# Patient Record
Sex: Male | Born: 1940 | Race: White | Hispanic: No | Marital: Married | Smoking: Former smoker
Health system: Southern US, Community
[De-identification: ages and names within clinical notes are randomized; demographics above are authoritative.]

## PROBLEM LIST (undated history)

## (undated) DIAGNOSIS — R51 Headache: Secondary | ICD-10-CM

## (undated) DIAGNOSIS — I351 Nonrheumatic aortic (valve) insufficiency: Secondary | ICD-10-CM

## (undated) DIAGNOSIS — R519 Headache, unspecified: Secondary | ICD-10-CM

## (undated) DIAGNOSIS — G4733 Obstructive sleep apnea (adult) (pediatric): Secondary | ICD-10-CM

## (undated) DIAGNOSIS — I712 Thoracic aortic aneurysm, without rupture, unspecified: Secondary | ICD-10-CM

## (undated) HISTORY — DX: Obstructive sleep apnea (adult) (pediatric): G47.33

## (undated) HISTORY — DX: Headache: R51

## (undated) HISTORY — DX: Headache, unspecified: R51.9

## (undated) HISTORY — PX: CATARACT EXTRACTION, BILATERAL: SHX1313

## (undated) HISTORY — PX: NOSE SURGERY: SHX723

## (undated) HISTORY — DX: Thoracic aortic aneurysm, without rupture, unspecified: I71.20

## (undated) HISTORY — DX: Nonrheumatic aortic (valve) insufficiency: I35.1

## (undated) HISTORY — PX: CHOLECYSTECTOMY: SHX55

## (undated) HISTORY — DX: Thoracic aortic aneurysm, without rupture: I71.2

## (undated) HISTORY — PX: HERNIA REPAIR: SHX51

## (undated) HISTORY — PX: BACK SURGERY: SHX140

---

## 2007-01-01 ENCOUNTER — Observation Stay (HOSPITAL_COMMUNITY): Admission: RE | Admit: 2007-01-01 | Discharge: 2007-01-02 | Payer: Self-pay | Admitting: Neurological Surgery

## 2008-12-23 LAB — HM COLONOSCOPY

## 2013-10-14 ENCOUNTER — Other Ambulatory Visit: Payer: Self-pay | Admitting: Family Medicine

## 2013-10-14 ENCOUNTER — Ambulatory Visit
Admission: RE | Admit: 2013-10-14 | Discharge: 2013-10-14 | Disposition: A | Discharge status: Home / Self Care | Payer: PRIVATE HEALTH INSURANCE | Source: Ambulatory Visit | Attending: Family Medicine | Admitting: Family Medicine

## 2013-10-14 DIAGNOSIS — M7071 Other bursitis of hip, right hip: Secondary | ICD-10-CM

## 2013-11-04 ENCOUNTER — Ambulatory Visit (INDEPENDENT_AMBULATORY_CARE_PROVIDER_SITE_OTHER): Payer: PRIVATE HEALTH INSURANCE | Admitting: Cardiovascular Disease

## 2013-11-04 ENCOUNTER — Encounter: Payer: Self-pay | Admitting: Cardiovascular Disease

## 2013-11-04 VITALS — BP 136/82 | HR 67 | Ht 71.0 in | Wt 184.0 lb

## 2013-11-04 DIAGNOSIS — I712 Thoracic aortic aneurysm, without rupture: Secondary | ICD-10-CM | POA: Insufficient documentation

## 2013-11-04 DIAGNOSIS — Z79899 Other long term (current) drug therapy: Secondary | ICD-10-CM

## 2013-11-04 DIAGNOSIS — E782 Mixed hyperlipidemia: Secondary | ICD-10-CM

## 2013-11-04 DIAGNOSIS — G4733 Obstructive sleep apnea (adult) (pediatric): Secondary | ICD-10-CM

## 2013-11-04 DIAGNOSIS — I359 Nonrheumatic aortic valve disorder, unspecified: Secondary | ICD-10-CM

## 2013-11-04 DIAGNOSIS — I351 Nonrheumatic aortic (valve) insufficiency: Secondary | ICD-10-CM

## 2013-11-04 NOTE — Assessment & Plan Note (Signed)
Followed annually by CT angiography to that was performed by Dr. Luberta Robertson . We will reschedule this

## 2013-11-04 NOTE — Assessment & Plan Note (Signed)
Followed by duplex ultrasound. The patient is asymptomatic

## 2013-11-04 NOTE — Patient Instructions (Signed)
  Your physician wants you to follow-up with him in : 1 year with Dr San Morelle will receive a reminder letter in the mail one month in advance. If you don't receive a letter, please call our office to schedule the follow-up appointment.   Your physician recommends that you return for lab work in: 1-2 weeks, fasting    Your physician has ordered the following tests: echocardiogram and CT angio of the chest to look at thoracic aortic aneurysm

## 2013-11-04 NOTE — Progress Notes (Signed)
11/04/2013 Christopher Atkinson   [birth year]  [id number]  Primary Physician Aida Puffer, MD Primary Cardiologist: Runell Gess MD Roseanne Reno   HPI:  Mr. 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 His cardiac risk factor profile is essentially benign. He was followed for thoracic aortic aneurysm by CT angiography annually, aortic insufficiency by 2-D echocardiography. He also has obstructive sleep apnea on CPAP. He denies chest pain or shortness of breath.   Current Outpatient Prescriptions  Medication Sig Dispense Refill  . aspirin 81 MG tablet Take 81 mg by mouth daily.      . calcium-vitamin D (OSCAL WITH D) 500-200 MG-UNIT per tablet Take 1 tablet by mouth daily with breakfast.      . Coenzyme Q10 (CO Q-10) 100 MG CAPS Take 100 mg by mouth daily.      . Multiple Vitamins-Minerals ([employer]S SPECTRAVITE ADULT 50+ PO) Take 1 tablet by mouth daily.      . Nutritional Supplements (JUICE PLUS FIBRE PO) Take 2 capsules by mouth daily. Garden blend and orchard blend      . Nutritional Supplements (PROSTAMEN PO) Take 1 capsule by mouth daily.      . Omega-3 Fatty Acids (OMEGA-3 PLUS PO) Take 1 tablet by mouth daily.      . Potassium 99 MG TABS Take 99 mg by mouth daily.      . Saw Palmetto 450 MG CAPS Take 450 mg by mouth daily.       No current facility-administered medications for this visit.    No Known Allergies  History   Social History  . Marital Status: Married    Spouse Name: N/A    Number of Children: N/A  . Years of Education: N/A   Occupational History  . Not on file.   Social History Main Topics  . Smoking status: Former Games developer  . Smokeless tobacco: Not on file     Comment: quit smoking when he was 72 years old  . Alcohol Use: No  . Drug Use: Not on file  . Sexual Activity: Not on file   Other Topics Concern    . Not on file   Social History Narrative  . No narrative on file     Review of Systems: General: negative for chills, fever, night sweats or weight changes.  Cardiovascular: negative for chest pain, dyspnea on exertion, edema, orthopnea, palpitations, paroxysmal nocturnal dyspnea or shortness of breath Dermatological: negative for rash Respiratory: negative for cough or wheezing Urologic: negative for hematuria Abdominal: negative for nausea, vomiting, diarrhea, bright red blood per rectum, melena, or hematemesis Neurologic: negative for visual changes, syncope, or dizziness All other systems reviewed and are otherwise negative except as noted above.    Blood pressure 136/82, pulse 67, height 5\' 11"  (1.803 m), weight 184 lb (83.462 kg).  General appearance: alert and no distress Neck: no adenopathy, no carotid bruit, no JVD, supple, symmetrical, trachea midline and thyroid not enlarged, symmetric, no tenderness/mass/nodules Lungs: clear to auscultation bilaterally Heart: soft diastolic murmur at left sternal border Abdomen: soft, non-tender; bowel sounds normal; no masses,  no organomegaly Extremities: extremities normal, atraumatic, no cyanosis or edema Pulses: 2+ and symmetric  EKG normal sinus rhythm at 67 with small inferior Q waves and otherwise nonspecific ST and T-wave changes unchanged from prior EKGs  ASSESSMENT AND PLAN:   Obstructive sleep  apnea Currently on CPAP  Thoracic aortic aneurysm Followed annually by CT angiography to that was performed by Dr. Luberta Robertson . We will reschedule this  Aortic insufficiency Followed by duplex ultrasound. The patient is asymptomatic      Runell Gess MD [facility], [facility] 11/04/2013 9:31 AM

## 2013-11-04 NOTE — Assessment & Plan Note (Signed)
Currently on CPAP. 

## 2013-11-05 ENCOUNTER — Telehealth: Payer: Self-pay | Admitting: Cardiovascular Disease

## 2013-11-05 NOTE — Telephone Encounter (Signed)
Appointment was scheduled in the late afternoon on Thursday 11/13 for Monday 11/17. A prior authorization request was submitted to [facility] Friday 11/14 in the morning. We have not received authorization from [place] at this time-it could take up to 3 business days. Called [facility] to notify. They rescheduled for Thursday 11/20 at 9:20am and asked me to notify the patient of the change. I called the patient at [phone] at 2 pm 11/14 and left a message that their appointment for Monday has been cancelled and moved to Thursday 11/20 at 9:20 am at [facility], and to call the office if she has any questions.

## 2013-11-08 ENCOUNTER — Other Ambulatory Visit: Payer: Self-pay | Admitting: Cardiovascular Disease

## 2013-11-08 ENCOUNTER — Encounter: Payer: Self-pay | Admitting: *Deleted

## 2013-11-08 ENCOUNTER — Other Ambulatory Visit: Payer: PRIVATE HEALTH INSURANCE

## 2013-11-08 LAB — HEPATIC FUNCTION PANEL
ALT: 25 U/L (ref 0–53)
Albumin: 4.2 g/dL (ref 3.5–5.2)
Alkaline Phosphatase: 64 U/L (ref 39–117)
Bilirubin, Direct: 0.2 mg/dL (ref 0.0–0.3)
Total Bilirubin: 1.1 mg/dL (ref 0.3–1.2)
Total Protein: 6.9 g/dL (ref 6.0–8.3)

## 2013-11-08 LAB — BASIC METABOLIC PANEL
BUN: 16 mg/dL (ref 6–23)
CO2: 30 mEq/L (ref 19–32)
Calcium: 9.8 mg/dL (ref 8.4–10.5)
Sodium: 140 mEq/L (ref 135–145)

## 2013-11-08 LAB — LIPID PANEL
LDL Cholesterol: 100 mg/dL — ABNORMAL HIGH (ref 0–99)
Total CHOL/HDL Ratio: 4 Ratio
Triglycerides: 87 mg/dL (ref <150)

## 2013-11-11 ENCOUNTER — Ambulatory Visit
Admission: RE | Admit: 2013-11-11 | Discharge: 2013-11-11 | Disposition: A | Discharge status: Home / Self Care | Payer: PRIVATE HEALTH INSURANCE | Source: Ambulatory Visit | Attending: Cardiovascular Disease | Admitting: Cardiovascular Disease

## 2013-11-11 ENCOUNTER — Encounter: Payer: Self-pay | Admitting: Cardiovascular Disease

## 2013-11-11 DIAGNOSIS — I712 Thoracic aortic aneurysm, without rupture: Secondary | ICD-10-CM

## 2013-11-11 MED ORDER — IOHEXOL 350 MG/ML SOLN
80.0000 mL | Freq: Once | INTRAVENOUS | Status: AC | PRN
  Administered 2013-11-11: 80 mL via INTRAVENOUS

## 2013-11-24 ENCOUNTER — Telehealth: Payer: Self-pay | Admitting: Cardiovascular Disease

## 2013-11-24 ENCOUNTER — Ambulatory Visit (HOSPITAL_COMMUNITY)
Admission: RE | Admit: 2013-11-24 | Discharge: 2013-11-24 | Disposition: A | Discharge status: Home / Self Care | Payer: PRIVATE HEALTH INSURANCE | Source: Ambulatory Visit | Attending: Cardiovascular Disease | Admitting: Cardiovascular Disease

## 2013-11-24 DIAGNOSIS — I712 Thoracic aortic aneurysm, without rupture: Secondary | ICD-10-CM

## 2013-11-24 DIAGNOSIS — I719 Aortic aneurysm of unspecified site, without rupture: Secondary | ICD-10-CM | POA: Insufficient documentation

## 2013-11-24 DIAGNOSIS — I351 Nonrheumatic aortic (valve) insufficiency: Secondary | ICD-10-CM

## 2013-11-24 DIAGNOSIS — E785 Hyperlipidemia, unspecified: Secondary | ICD-10-CM | POA: Insufficient documentation

## 2013-11-24 DIAGNOSIS — I359 Nonrheumatic aortic valve disorder, unspecified: Secondary | ICD-10-CM | POA: Insufficient documentation

## 2013-11-24 DIAGNOSIS — G4730 Sleep apnea, unspecified: Secondary | ICD-10-CM | POA: Insufficient documentation

## 2013-11-24 NOTE — Progress Notes (Signed)
2D Echo Performed 11/24/2013    Mikka Kissner, RCS  

## 2013-11-24 NOTE — Telephone Encounter (Signed)
Returning your call from yesterday-concerning test results.

## 2013-11-25 ENCOUNTER — Telehealth: Payer: Self-pay | Admitting: Cardiovascular Disease

## 2013-11-25 NOTE — Telephone Encounter (Signed)
I called [facility].  They have the referral for Christopher Atkinson and will be calling the patient to set up an appointment.

## 2013-11-25 NOTE — Telephone Encounter (Signed)
Message copied by Marella Bile on Thu Nov 25, 2013 10:48 AM ------      Message from: Runell Gess      Created: Fri Nov 12, 2013  6:31 AM       Stable ascending TAA with Max diameter of 5.0 cm. Will continue to follow. ------

## 2013-12-08 ENCOUNTER — Encounter: Payer: Self-pay | Admitting: Surgery

## 2013-12-08 ENCOUNTER — Institutional Professional Consult (permissible substitution) (INDEPENDENT_AMBULATORY_CARE_PROVIDER_SITE_OTHER): Payer: PRIVATE HEALTH INSURANCE | Admitting: Surgery

## 2013-12-08 VITALS — BP 130/79 | HR 60 | Resp 18 | Ht 71.0 in | Wt 184.0 lb

## 2013-12-08 DIAGNOSIS — I712 Thoracic aortic aneurysm, without rupture, unspecified: Secondary | ICD-10-CM

## 2013-12-10 ENCOUNTER — Encounter: Payer: Self-pay | Admitting: Surgery

## 2013-12-10 NOTE — Progress Notes (Signed)
PCP is Aida Puffer, MD Referring Provider is Runell Gess, MD  Chief Complaint  Patient presents with  . Thoracic Aortic Aneurysm    Surgical eval on ascending aortic aneurysm, CTA Chest 11/11/13, 2D Echo 11/24/13    HPI:  The patient is a 72 year old gentleman who has been followed for many years by Dr. Retta Mac in [facility] until he recently retired. He has a know history of an ascending aortic aneurysm and mild AI although his family denies ever being told by Dr. Luberta Robertson that he had an aneurysm. His cardiology care was recently transferred to Dr. Allyson Sabal. A recent 2D echo showed mild AI with dilatation of the aortic root and ascending aorta with a maximum diameter at the sinus of valsalva of 43 mm and at the mid-ascending level of 43 mm. He had a CTA of the chest on 12 /03/2011 which showed the maximum diameter of the ascending aorta to be 5.0 cm which is unchanged compared to prior studies dating back to 10/01/2010. He had a negative stress echo in 05/2005 and negative Lexiscans in 12/2009 and 12/2011. He reports that he feels fine with no chest or back pain. His family feels that his energy level is down and they are concerned that he has some dementia that has not been worked up.   Past Medical History  Diagnosis Date  . Thoracic aortic aneurysm   . Aortic insufficiency   . Obstructive sleep apnea     on CPAP    History reviewed. No pertinent past surgical history.  Family History  Problem Relation Age of Onset  . Heart Problems Father     CHF  . Stroke Brother     Social History History  Substance Use Topics  . Smoking status: Former Games developer  . Smokeless tobacco: Not on file     Comment: quit smoking when he was 72 years old  . Alcohol Use: No    Current Outpatient Prescriptions  Medication Sig Dispense Refill  . aspirin 81 MG tablet Take 81 mg by mouth daily.      . calcium-vitamin D (OSCAL WITH D) 500-200 MG-UNIT per tablet Take 1 tablet by mouth  daily with breakfast.      . Coenzyme Q10 (CO Q-10) 100 MG CAPS Take 100 mg by mouth daily.      . Multiple Vitamins-Minerals (CVS SPECTRAVITE ADULT 50+ PO) Take 1 tablet by mouth daily.      . Nutritional Supplements (JUICE PLUS FIBRE PO) Take 2 capsules by mouth daily. Garden blend and orchard blend      . Nutritional Supplements (PROSTAMEN PO) Take 1 capsule by mouth daily.      . Omega-3 Fatty Acids (OMEGA-3 PLUS PO) Take 1 tablet by mouth daily.      . Potassium 99 MG TABS Take 99 mg by mouth daily.      . Saw Palmetto 450 MG CAPS Take 450 mg by mouth daily.       No current facility-administered medications for this visit.    No Known Allergies  Review of Systems  Constitutional: Positive for activity change. Negative for fever, chills, diaphoresis, appetite change, fatigue and unexpected weight change.  HENT: Negative.   Eyes: Negative.   Respiratory: Negative.   Cardiovascular: Negative.   Gastrointestinal: Negative.   Endocrine: Negative.   Genitourinary: Negative.   Musculoskeletal: Negative.   Skin: Negative.   Allergic/Immunologic: Negative.   Neurological: Negative.  Family has noticed some short-term memory loss  Hematological: Negative.   Psychiatric/Behavioral: Negative.     BP 130/79  Pulse 60  Resp 18  Ht 5\' 11"  (1.803 m)  Wt 184 lb (83.462 kg)  BMI 25.67 kg/m2  SpO2 98% Physical Exam  Constitutional: He is oriented to person, place, and time. He appears well-developed and well-nourished. No distress.  HENT:  Head: Normocephalic and atraumatic.  Mouth/Throat: Oropharynx is clear and moist.  Eyes: EOM are normal. Pupils are equal, round, and reactive to light.  Neck: Normal range of motion. Neck supple. No JVD present. No thyromegaly present.  Cardiovascular: Normal rate, regular rhythm and intact distal pulses.   Murmur heard. 1/6 diastolic murmur along RSB  Pulmonary/Chest: Effort normal and breath sounds normal. No respiratory distress. He  has no wheezes. He has no rales. He exhibits no tenderness.  Abdominal: Soft. Bowel sounds are normal. He exhibits no distension and no mass. There is no tenderness.  Musculoskeletal: Normal range of motion. He exhibits no edema.  Lymphadenopathy:    He has no cervical adenopathy.  Neurological: He is alert and oriented to person, place, and time. No cranial nerve deficit.  Skin: Skin is warm and dry. No erythema.  Psychiatric: He has a normal mood and affect.     Diagnostic Tests:  *Cardiovascular Imaging at [facility] [address], [address] [place], [place] [address] [phone]  ------------------------------------------------------------ Echocardiography  Patient: Luisalberto, Beegle MR #: [id number] Study Date: 11/24/2013 Gender: M Age: 71 Height: 180.3cm Weight: 83.5kg BSA: 2.57m^2 Pt. Status: Room:  ATTENDING Ginnie Smart, MD REFERRING Nanetta Batty, MD REFERRING Aida Puffer SONOGRAPHER Clearence Ped, RCS PERFORMING [facility] cc:  ------------------------------------------------------------ LV EF: 55% - 60%  ------------------------------------------------------------ Indications: 424.1 Aortic valve disorders.  ------------------------------------------------------------ History: PMH: Hyperlipidemia, sleep apnea, aortic aneurysm  ------------------------------------------------------------ Study Conclusions  - Left ventricle: The cavity size was normal. Systolic function was normal. The estimated ejection fraction was in the range of 55% to 60%. Wall motion was normal; there were no regional wall motion abnormalities. Left ventricular diastolic function parameters were normal. - Aortic valve: Mild regurgitation. - Ascending aorta: The ascending aorta was mildly dilated. Maximum diameter is 43 mm at the sinuses of Valsalva and 43 mm in the mid ascending aorta. The sinotubular junction is not effaced. - Mitral valve: Valve  area by pressure half-time: 2.06cm^2. - Atrial septum: No defect or patent foramen ovale was identified. Echocardiography. M-mode, complete 2D, spectral Doppler, and color Doppler. Height: Height: 180.3cm. Height: 71in. Weight: Weight: 83.5kg. Weight: 183.6lb. Body mass index: BMI: 25.7kg/m^2. Body surface area: BSA: 2.56m^2. Blood pressure: 136/82. Patient status: Outpatient. Location: Echo laboratory.  ------------------------------------------------------------  ------------------------------------------------------------ Left ventricle: The cavity size was normal. Systolic function was normal. The estimated ejection fraction was in the range of 55% to 60%. Wall motion was normal; there were no regional wall motion abnormalities. The transmitral flow pattern was normal. The deceleration time of the early transmitral flow velocity was normal. The pulmonary vein flow pattern was normal. The tissue Doppler parameters were normal. Left ventricular diastolic function parameters were normal.  ------------------------------------------------------------ Aortic valve: Doppler: Mild regurgitation. VTI ratio of LVOT to aortic valve: 0.73. Peak velocity ratio of LVOT to aortic valve: 0.63. Mean gradient: 6mm Hg (S). Peak gradient: 12mm Hg (S).  ------------------------------------------------------------ Aorta: Ascending aorta: The ascending aorta was mildly dilated. Maximum diameter is 43 mm at the sinuses of Valsalva and 43 mm in the mid ascending aorta. The sinotubular junction is not effaced.  ------------------------------------------------------------  Mitral valve: Doppler: Trivial regurgitation. Valve area by pressure half-time: 2.06cm^2. Indexed valve area by pressure half-time: 1.01cm^2/m^2. Mean gradient: 1mm Hg (D).  ------------------------------------------------------------ Left atrium: LA volume / BSA = 22.5 ml/m2 The atrium was normal in  size.  ------------------------------------------------------------ Atrial septum: No defect or patent foramen ovale was identified.  ------------------------------------------------------------ Right ventricle: The cavity size was normal. Wall thickness was normal. Systolic function was normal.  ------------------------------------------------------------ Pulmonic valve: The valve appears to be grossly normal. Doppler: Mild regurgitation.  ------------------------------------------------------------ Tricuspid valve: Structurally normal valve. Leaflet separation was normal. Doppler: Transvalvular velocity was within the normal range. No regurgitation.  ------------------------------------------------------------ Pulmonary artery: Systolic pressure was within the normal range.  ------------------------------------------------------------ Right atrium: The atrium was normal in size.  ------------------------------------------------------------ Pericardium: There was no pericardial effusion.  ------------------------------------------------------------ Systemic veins: Inferior vena cava: The vessel was normal in size; the respirophasic diameter changes were in the normal range (= 50%); findings are consistent with normal central venous pressure.  ------------------------------------------------------------  2D measurements Normal Doppler measurements Normal Left ventricle Main pulmonary LVID ED, 53.1 mm 43-52 artery chord, Pressure, 18 mm Hg =30 PLAX S LVID ES, 35.7 mm 23-38 Left ventricle chord, Ea, lat 7 cm/s ------ PLAX ann, tiss FS, 33 % >29 DP chord, E/Ea, lat 9.66 ------ PLAX ann, tiss LVPW, ED 9.04 mm ------ DP IVS/LVPW 1.02 <1.3 Ea, med 5.5 cm/s ------ ratio, ED ann, tiss Ventricular septum DP IVS, ED 9.18 mm ------ E/Ea, med 12.2 ------ Aortic valve ann, tiss 9 Annulus 23.28 mm 14-26 DP diam, ED LVOT Aorta Peak vel, 108 cm/s ------ Root max 42.44 mm  ------ S diam, ED VTI, S 26.9 cm ------ S-T junct 38.01 mm ------ Aortic valve diam, ED Peak vel, 172 cm/s ------ Mid-AAo 42.92 mm 21-34 S AP diam, Mean vel, 109 cm/s ------ ED S Left atrium VTI, S 37.1 cm ------ AP dim 37 mm ------ Mean 6 mm Hg ------ AP dim 1.81 cm/m^2 <2.2 gradient, index S Right ventricle Peak 12 mm Hg ------ RVID ED, 27.6 mm 19-38 gradient, PLAX S VTI ratio 0.73 ------ LVOT/AV Peak vel 0.63 ------ ratio, LVOT/AV Regurg PHT 599 ms ------ Mitral valve Peak E vel 67.6 cm/s ------ Peak A vel 63.2 cm/s ------ Mean vel, 43.9 cm/s ------ D Decelerati 257 ms 150-23 on time 0 Pressure 107 ms ------ half-time Mean 1 mm Hg ------ gradient, D Peak E/A 1.1 ------ ratio Area (PHT) 2.06 cm^2 ------ Area index 1.01 cm^2/m ------ (PHT) ^2 Annulus 28.1 cm ------ VTI Tricuspid valve Regurg 181 cm/s ------ peak vel Peak RV-RA 13 mm Hg ------ gradient, S Systemic veins Estimated 5 mm Hg ------ CVP Right ventricle Pressure, 18 mm Hg <30 S Sa vel, 12.9 cm/s ------ lat ann, tiss DP  ------------------------------------------------------------ Prepared and Electronically Authenticated by  Croitoru, Mihai 2014-12-03T13:21:29.017   CLINICAL DATA: Thoracic aortic aneurysm  EXAM:  CT ANGIOGRAPHY CHEST WITH CONTRAST  TECHNIQUE:  Multidetector CT imaging of the chest was performed using the  standard protocol during bolus administration of intravenous  contrast. Multiplanar CT image reconstructions including MIPs were  obtained to evaluate the vascular anatomy.  CONTRAST: 80mL OMNIPAQUE IOHEXOL 350 MG/ML SOLN  COMPARISON: 02/13/2012  FINDINGS:  Maximal diameters of the ascending aorta at the sinus, sino-tubular  junction, and ascending aorta are 4.6 cm, 3.8 cm, and 5.0 cm.  Previously, maximal diameter was recorded at 5.2 cm. This is likely  not significantly changed allowing for differences in measuring  technique. Great vessels are widely patent.  Vertebral arteries  within the extend of the study are also patent.  No evidence of aortic dissection or intramural hematoma.  No abnormal mediastinal adenopathy. No pericardial effusion.  Lungs are clear.  No pleural effusion. No pneumothorax.  No acute bony deformity.  Images of the upper abdomen demonstrate a benign right renal cysts  and postcholecystectomy clips.  Review of the MIP images confirms the above findings.  IMPRESSION:  Stable ascending aortic aneurysm with a maximal diameter of 5.0 cm.  Electronically Signed  By: Maryclare Bean M.D.  On: 11/11/2013 10:07    Impression:  He has a stable 5 cm ascending aortic aneurysm with mild AI. This has been stable on CT scan since 10/01/10. This is still below the usual threshold of 5.5 cm where we recommend surgery in patients with tricuspid aortic valves. Since he is 72 years old and this has been stable for at least the last 3 years I would recommend a conservative approach and continue to follow this yearly. I have reviewed the echo and CT scan pictures with the patient and his family and answered all of their questions. I discussed the importance of good blood pressure control. He should probably be on a beta-blocker if tolerated but I will leave that decision up to Dr. Allyson Sabal. I discussed the small but potential risk of an aortic dissection or rupture with a 5 cm aorta and the fact that his operative risk now is higher than the risk of these complications. I discussed the signs and symptoms of aortic dissection and impending rupture with them. His family seems most concerned about the possibility of dementia and I told them that that should be evaluated by a neurologist.  Plan:  I will see him back in 1 year with a CTA of the chest.

## 2013-12-22 ENCOUNTER — Telehealth: Payer: Self-pay | Admitting: *Deleted

## 2013-12-22 ENCOUNTER — Encounter: Payer: Self-pay | Admitting: *Deleted

## 2013-12-22 DIAGNOSIS — I351 Nonrheumatic aortic (valve) insufficiency: Secondary | ICD-10-CM

## 2013-12-22 NOTE — Telephone Encounter (Signed)
Message copied by Marella Bile on Wed Dec 22, 2013 11:25 AM ------      Message from: Runell Gess      Created: Sun Nov 28, 2013  7:22 PM       Nl LV fxn. Mild AI. Moderate dilatation Ascending Ao. Repeat annually ------

## 2013-12-22 NOTE — Telephone Encounter (Signed)
Order placed for repeat echo in 1 year 

## 2014-02-14 ENCOUNTER — Telehealth: Payer: Self-pay | Admitting: Cardiovascular Disease

## 2014-02-14 NOTE — Telephone Encounter (Signed)
Message forwarded to Curt Bears, RN, Dr. Kennon Holter nurse.  This RN unable to see any referral to neurology

## 2014-02-14 NOTE — Telephone Encounter (Signed)
She wanted to know if Dr Gwenlyn Found ever get the sleep study report? Also he was suppose to be getting an appointment with a neurologist,never heard from either one.

## 2014-02-14 NOTE — Telephone Encounter (Signed)
Note continued.  Sleep study documents scanned under "Procedures" tab.  Will defer to primary RN for f/u with pt.

## 2014-02-14 NOTE — Telephone Encounter (Signed)
We have a copy of a download from his CPAP machine, not the sleep study.  Dr Gwenlyn Found did not make any indication of a neurology appt in his dictation.  I asked the patient's wife about the neurology referral, she thought it had something to do with the sleep study.  I explained that a sleep study would not normally indicate a referral to a neurologist.  Patient's wife verbalized understanding.  I will send Dr Claiborne Billings a message about the download of the machine.

## 2014-08-01 ENCOUNTER — Other Ambulatory Visit (HOSPITAL_COMMUNITY): Payer: Self-pay | Admitting: Family Medicine

## 2014-08-01 DIAGNOSIS — G44321 Chronic post-traumatic headache, intractable: Secondary | ICD-10-CM

## 2014-08-02 ENCOUNTER — Encounter (HOSPITAL_COMMUNITY): Payer: Self-pay

## 2014-08-02 ENCOUNTER — Ambulatory Visit (HOSPITAL_COMMUNITY)
Admission: RE | Admit: 2014-08-02 | Discharge: 2014-08-02 | Disposition: A | Discharge status: Home / Self Care | Payer: Medicare HMO | Source: Ambulatory Visit | Attending: Family Medicine | Admitting: Family Medicine

## 2014-08-02 DIAGNOSIS — G44321 Chronic post-traumatic headache, intractable: Secondary | ICD-10-CM

## 2014-08-02 DIAGNOSIS — G44329 Chronic post-traumatic headache, not intractable: Secondary | ICD-10-CM | POA: Insufficient documentation

## 2014-08-02 LAB — CREATININE, SERUM
Creatinine, Ser: 0.63 mg/dL (ref 0.50–1.35)
GFR calc Af Amer: >90 mL/min (ref >90)
GFR calc non Af Amer: >90 mL/min (ref >90)

## 2014-08-02 LAB — BUN: BUN: 14 mg/dL (ref 6–23)

## 2014-08-02 MED ORDER — IOHEXOL 300 MG/ML  SOLN
80.0000 mL | Freq: Once | INTRAMUSCULAR | Status: AC | PRN
  Administered 2014-08-02: 80 mL via INTRAVENOUS

## 2014-09-13 ENCOUNTER — Encounter (HOSPITAL_COMMUNITY): Payer: Self-pay | Admitting: Emergency Medicine

## 2014-09-13 ENCOUNTER — Emergency Department (HOSPITAL_COMMUNITY)
Admission: EM | Admit: 2014-09-13 | Discharge: 2014-09-14 | Disposition: A | Discharge status: Home / Self Care | Payer: Medicare HMO | Attending: Emergency Medicine | Admitting: Emergency Medicine

## 2014-09-13 ENCOUNTER — Emergency Department (HOSPITAL_COMMUNITY): Payer: Medicare HMO

## 2014-09-13 DIAGNOSIS — R42 Dizziness and giddiness: Secondary | ICD-10-CM | POA: Diagnosis not present

## 2014-09-13 DIAGNOSIS — Z9981 Dependence on supplemental oxygen: Secondary | ICD-10-CM | POA: Insufficient documentation

## 2014-09-13 DIAGNOSIS — I712 Thoracic aortic aneurysm, without rupture, unspecified: Secondary | ICD-10-CM | POA: Insufficient documentation

## 2014-09-13 DIAGNOSIS — Z87891 Personal history of nicotine dependence: Secondary | ICD-10-CM | POA: Diagnosis not present

## 2014-09-13 DIAGNOSIS — R112 Nausea with vomiting, unspecified: Secondary | ICD-10-CM | POA: Diagnosis present

## 2014-09-13 DIAGNOSIS — Z79899 Other long term (current) drug therapy: Secondary | ICD-10-CM | POA: Insufficient documentation

## 2014-09-13 DIAGNOSIS — Z7982 Long term (current) use of aspirin: Secondary | ICD-10-CM | POA: Insufficient documentation

## 2014-09-13 DIAGNOSIS — G4733 Obstructive sleep apnea (adult) (pediatric): Secondary | ICD-10-CM | POA: Diagnosis not present

## 2014-09-13 LAB — COMPREHENSIVE METABOLIC PANEL
ALK PHOS: 83 U/L (ref 39–117)
ALT: 27 U/L (ref 0–53)
AST: 28 U/L (ref 0–37)
Albumin: 3.7 g/dL (ref 3.5–5.2)
Anion gap: 12 (ref 5–15)
BUN: 13 mg/dL (ref 6–23)
CO2: 26 mEq/L (ref 19–32)
CREATININE: 0.6 mg/dL (ref 0.50–1.35)
Calcium: 9.3 mg/dL (ref 8.4–10.5)
Chloride: 102 mEq/L (ref 96–112)
GFR calc Af Amer: >90 mL/min (ref >90)
GFR calc non Af Amer: >90 mL/min (ref >90)
Glucose, Bld: 185 mg/dL — ABNORMAL HIGH (ref 70–99)
POTASSIUM: 4 meq/L (ref 3.7–5.3)
Sodium: 140 mEq/L (ref 137–147)
TOTAL PROTEIN: 7.2 g/dL (ref 6.0–8.3)
Total Bilirubin: 1.4 mg/dL — ABNORMAL HIGH (ref 0.3–1.2)

## 2014-09-13 LAB — I-STAT TROPONIN, ED
Troponin i, poc: 0 ng/mL (ref 0.00–0.08)
Troponin i, poc: 0 ng/mL (ref 0.00–0.08)

## 2014-09-13 LAB — CBC
HEMATOCRIT: 44.4 % (ref 39.0–52.0)
Hemoglobin: 15.5 g/dL (ref 13.0–17.0)
MCH: 30.4 pg (ref 26.0–34.0)
MCHC: 34.9 g/dL (ref 30.0–36.0)
MCV: 87.1 fL (ref 78.0–100.0)
Platelets: 163 10*3/uL (ref 150–400)
RBC: 5.1 MIL/uL (ref 4.22–5.81)
RDW: 12.4 % (ref 11.5–15.5)
WBC: 7.7 10*3/uL (ref 4.0–10.5)

## 2014-09-13 MED ORDER — ONDANSETRON 4 MG PO TBDP: 4.0000 mg | ORAL_TABLET | Freq: Three times a day (TID) | ORAL | Status: DC | PRN

## 2014-09-13 MED ORDER — SODIUM CHLORIDE 0.9 % IV BOLUS (SEPSIS)
1000.0000 mL | Freq: Once | INTRAVENOUS | Status: AC
  Administered 2014-09-13: 1000 mL via INTRAVENOUS

## 2014-09-13 MED ORDER — ONDANSETRON 4 MG PO TBDP
4.0000 mg | ORAL_TABLET | Freq: Once | ORAL | Status: AC
  Administered 2014-09-13: 4 mg via ORAL
  Filled 2014-09-13: qty 1

## 2014-09-13 NOTE — Discharge Instructions (Signed)

## 2014-09-13 NOTE — ED Provider Notes (Signed)
I saw and evaluated the patient, reviewed the resident's note and I agree with the findings and plan.   EKG Interpretation   Date/Time:  Tuesday September 13 2014 18:05:17 EDT Ventricular Rate:  65 PR Interval:  190 QRS Duration: 103 QT Interval:  434 QTC Calculation: 451 R Axis:   56 Text Interpretation:  Sinus rhythm Nonspecific T wave abnormality No  significant change since last tracing Confirmed by Ashok Cordia  MD, Lennette Bihari  ([id number]) on 09/13/2014 6:08:29 PM      Pt w nv earlier. Currently states hungry, normal appetite. Denies abd pain. No fever or chills. Denies any current or recent cp. Chest cta. abd soft nt. Ambulatory to bathroom. Labs.   Mirna Mires, MD 09/13/14 [id number]

## 2014-09-13 NOTE — ED Notes (Signed)
Pt to ED via [facility] from MD's office.  Pt's st's he woke up this am approx 1am with nausea and vomiting.  St's he had chest pain when he was vomiting but denies any chest pain at this time.  Pt was given Phenergan at MD's office and st's now nausea has subsided.  Pt alert and oriented x's 3.  Skin warm and dry, color appropriate

## 2014-09-13 NOTE — ED Provider Notes (Signed)
CSN: [id number]     Arrival date & time 09/13/14  1801 History   First MD Initiated Contact with Patient 09/13/14 1806     Chief Complaint  Patient presents with  . Emesis   Patient is a 73 y.o. male presenting with dizziness and vomiting. The history is provided by the patient.  Dizziness Quality:  Lightheadedness Severity:  Mild Context: standing up   Relieved by:  Being still Associated symptoms: chest pain (only after vomiting none now), nausea and vomiting   Associated symptoms: no blood in stool, no headaches, no shortness of breath, no syncope, no vision changes and no weakness   Emesis Severity:  Moderate Duration: started at 0130 today. Number of daily episodes:  4-6? Quality:  Stomach contents Able to tolerate:  Liquids Progression:  Improving Chronicity:  New Recent urination:  Normal Context: not post-tussive and not self-induced   Associated symptoms: no abdominal pain, no chills, no cough, no fever, no headaches, no sore throat and no URI   Risk factors: suspect food intake (pt says he ate a spoiled chicken last night. His wife picked up a chicken from somewhere and he said it smelled and tasted horrible so he threw it out. His wife did not eat it. )   Risk factors: no alcohol use, no diabetes, no prior abdominal surgery, no sick contacts and no travel to endemic areas    Family is concerned that patient is more unsteady on feet. They say he has had headaches daily for 30 years with associated lightheadedness.  They had a CT head done last month which was negative.  He trips on their dogs more often, has not struck head nor LOC.  They would like a neurology consult.  Pt denies current HA, vision change, weakness, slurred speech, confusion, neck pain.   Past Medical History  Diagnosis Date  . Thoracic aortic aneurysm   . Aortic insufficiency   . Obstructive sleep apnea     on CPAP   No past surgical history on file. Family History  Problem Relation Age of Onset  .  Heart Problems Father     CHF  . Stroke Brother    History  Substance Use Topics  . Smoking status: Former Research scientist (life sciences)  . Smokeless tobacco: Not on file     Comment: quit smoking when he was 73 years old  . Alcohol Use: No    Review of Systems  Constitutional: Negative for fever and chills.  HENT: Negative for sore throat.   Respiratory: Negative for cough, shortness of breath and wheezing.   Cardiovascular: Positive for chest pain (only after vomiting none now). Negative for syncope.  Gastrointestinal: Positive for nausea and vomiting. Negative for abdominal pain and blood in stool.  Genitourinary: Negative for dysuria.  Musculoskeletal: Negative for back pain.  Skin: Negative for rash.  Neurological: Positive for dizziness. Negative for headaches.  All other systems reviewed and are negative.     Allergies  Review of patient's allergies indicates no known allergies.  Home Medications   Prior to Admission medications   Medication Sig Start Date End Date Taking? Authorizing Provider  aspirin 81 MG tablet Take 81 mg by mouth daily.    Historical Provider, MD  calcium-vitamin D (OSCAL WITH D) 500-200 MG-UNIT per tablet Take 1 tablet by mouth daily with breakfast.    Historical Provider, MD  Coenzyme Q10 (CO Q-10) 100 MG CAPS Take 100 mg by mouth daily.    Historical Provider, MD  Multiple Vitamins-Minerals (  [employer] SPECTRAVITE ADULT 50+ PO) Take 1 tablet by mouth daily.    Historical Provider, MD  Nutritional Supplements (JUICE PLUS FIBRE PO) Take 2 capsules by mouth daily. Garden blend and orchard blend    Historical Provider, MD  Nutritional Supplements (PROSTAMEN PO) Take 1 capsule by mouth daily.    Historical Provider, MD  Omega-3 Fatty Acids (OMEGA-3 PLUS PO) Take 1 tablet by mouth daily.    Historical Provider, MD  Potassium 99 MG TABS Take 99 mg by mouth daily.    Historical Provider, MD  Saw Palmetto 450 MG CAPS Take 450 mg by mouth daily.    Historical Provider, MD   BP  149/55  Pulse 74  Temp(Src) 97.4 F (36.3 C) (Oral)  Resp 18  Ht 5\' 10"  (1.778 m)  Wt 181 lb (82.101 kg)  BMI 25.97 kg/m2  SpO2 99% Physical Exam  Nursing note and vitals reviewed. Constitutional: He is oriented to person, place, and time. He appears well-developed and well-nourished. No distress.  HENT:  Head: Normocephalic and atraumatic.  Nose: Nose normal.  Mouth/Throat: Oropharynx is clear and moist. No oropharyngeal exudate.  Eyes: Conjunctivae and EOM are normal. Pupils are equal, round, and reactive to light.  Neck: Normal range of motion. Neck supple. No JVD present. No tracheal deviation present.  Cardiovascular: Normal rate, regular rhythm, normal heart sounds and intact distal pulses.   No murmur heard. Pulmonary/Chest: Effort normal and breath sounds normal. No respiratory distress. He has no wheezes. He has no rales. He exhibits no tenderness.  No crepitus  Abdominal: Soft. Bowel sounds are normal. He exhibits no distension and no mass. There is no tenderness.  Musculoskeletal: Normal range of motion. He exhibits no edema and no tenderness.  No lower extremity edema, calf tenderness, warmth, erythema or palpable cords    Neurological: He is alert and oriented to person, place, and time.  Alert and oriented. Strength 5/5 for hip flexion, extension, knee flexion and extension, dorsiflexion and plantar flexion.  Normal bulk and tone.  No sensory deficit to light touch.  Gait observed normal.  Symmetric and equal 2+ patellar DTRs.     Skin: Skin is warm and dry. No rash noted.  Psychiatric: He has a normal mood and affect.    ED Course  Procedures (including critical care time) Labs Review Labs Reviewed  COMPREHENSIVE METABOLIC PANEL - Abnormal; Notable for the following:    Glucose, Bld 185 (*)    Total Bilirubin 1.4 (*)    All other components within normal limits  CBC  I-STAT TROPOININ, ED  Randolm Idol, ED    Imaging Review Dg Chest 2 View  09/13/2014    CLINICAL DATA:  Chest pain with nausea and vomiting  EXAM: CHEST  2 VIEW  COMPARISON:  11/11/2013 CT scan  FINDINGS: Known aneurysm of the ascending aorta. Heart size and vascular pattern otherwise normal. Lungs clear. No effusions.  IMPRESSION: Known aortic aneurysm.  No acute findings.   Electronically Signed   By: Skipper Cliche M.D.   On: 09/13/2014 19:07     EKG Interpretation   Date/Time:  Tuesday September 13 2014 18:05:17 EDT Ventricular Rate:  65 PR Interval:  190 QRS Duration: 103 QT Interval:  434 QTC Calculation: 451 R Axis:   56 Text Interpretation:  Sinus rhythm Nonspecific T wave abnormality No  significant change since last tracing Confirmed by Ashok Cordia  MD, Lennette Bihari  ([id number]) on 09/13/2014 6:08:29 PM      MDM   Final  diagnoses:  Nausea and vomiting in adult    Pt presents for nausea and vomiting after eating a spoiled chicken breast per patient.  He had chest pain after bout of NB emesis.  He went to PCP who captured an EKG with possible inferior ST elevations.  I have reviewed these images (poor quality) and do not appreciate ST elevations greater than 64mm.  EKG at triage and EMS shows no ST elevation and patient denies chest pain.  EKG unchanged compared to prior.  Delta trop negative. Doubt ACS.  No signs of DVT, normal vitals, doubt PE.  No pneumediastinum on CXR to indicate esophageal rupture and currently without pain. Pt has known thoracic aneurysm, without pain currently doubt dissection or leak. Nausea has resolved after treatment.  Family is concerned about headache for 30 years with lightheadedness.  Neuro exam completely normal.  Normal gait. No cerebellar signs.  CT head last month negative.  Neuro referral placed. Labs unremarkable  Pt is asymptomatic currently. Stable for discharge.      Tammy Sours, MD 09/13/14 [phone]

## 2014-10-27 ENCOUNTER — Emergency Department (HOSPITAL_COMMUNITY): Payer: Medicare HMO

## 2014-10-27 ENCOUNTER — Observation Stay (HOSPITAL_COMMUNITY)
Admission: EM | Admit: 2014-10-27 | Discharge: 2014-10-28 | Disposition: A | Discharge status: Home / Self Care | Payer: Medicare HMO | Attending: Internal Medicine | Admitting: Internal Medicine

## 2014-10-27 ENCOUNTER — Inpatient Hospital Stay (HOSPITAL_COMMUNITY): Payer: Medicare HMO

## 2014-10-27 ENCOUNTER — Encounter (HOSPITAL_COMMUNITY): Payer: Self-pay | Admitting: Emergency Medicine

## 2014-10-27 DIAGNOSIS — Z7982 Long term (current) use of aspirin: Secondary | ICD-10-CM | POA: Diagnosis not present

## 2014-10-27 DIAGNOSIS — R4701 Aphasia: Secondary | ICD-10-CM

## 2014-10-27 DIAGNOSIS — R4182 Altered mental status, unspecified: Secondary | ICD-10-CM | POA: Insufficient documentation

## 2014-10-27 DIAGNOSIS — I712 Thoracic aortic aneurysm, without rupture, unspecified: Secondary | ICD-10-CM | POA: Diagnosis present

## 2014-10-27 DIAGNOSIS — I351 Nonrheumatic aortic (valve) insufficiency: Secondary | ICD-10-CM | POA: Diagnosis not present

## 2014-10-27 DIAGNOSIS — Z8669 Personal history of other diseases of the nervous system and sense organs: Secondary | ICD-10-CM | POA: Insufficient documentation

## 2014-10-27 DIAGNOSIS — G458 Other transient cerebral ischemic attacks and related syndromes: Secondary | ICD-10-CM

## 2014-10-27 DIAGNOSIS — Z87891 Personal history of nicotine dependence: Secondary | ICD-10-CM | POA: Insufficient documentation

## 2014-10-27 DIAGNOSIS — R51 Headache: Secondary | ICD-10-CM | POA: Diagnosis not present

## 2014-10-27 DIAGNOSIS — G459 Transient cerebral ischemic attack, unspecified: Secondary | ICD-10-CM

## 2014-10-27 DIAGNOSIS — Z79899 Other long term (current) drug therapy: Secondary | ICD-10-CM | POA: Diagnosis not present

## 2014-10-27 DIAGNOSIS — G4733 Obstructive sleep apnea (adult) (pediatric): Secondary | ICD-10-CM | POA: Diagnosis not present

## 2014-10-27 DIAGNOSIS — I639 Cerebral infarction, unspecified: Secondary | ICD-10-CM | POA: Diagnosis not present

## 2014-10-27 LAB — COMPREHENSIVE METABOLIC PANEL
ALBUMIN: 3.9 g/dL (ref 3.5–5.2)
ALT: 27 U/L (ref 0–53)
ANION GAP: 14 (ref 5–15)
AST: 32 U/L (ref 0–37)
Alkaline Phosphatase: 75 U/L (ref 39–117)
BUN: 18 mg/dL (ref 6–23)
CALCIUM: 9.3 mg/dL (ref 8.4–10.5)
CO2: 25 mEq/L (ref 19–32)
Chloride: 103 mEq/L (ref 96–112)
Creatinine, Ser: 0.64 mg/dL (ref 0.50–1.35)
GFR calc Af Amer: >90 mL/min (ref >90)
GFR calc non Af Amer: >90 mL/min (ref >90)
GLUCOSE: 136 mg/dL — AB (ref 70–99)
Potassium: 4.1 mEq/L (ref 3.7–5.3)
SODIUM: 142 meq/L (ref 137–147)
TOTAL PROTEIN: 7.3 g/dL (ref 6.0–8.3)
Total Bilirubin: 1.2 mg/dL (ref 0.3–1.2)

## 2014-10-27 LAB — CBC
HCT: 43.3 % (ref 39.0–52.0)
Hemoglobin: 15.1 g/dL (ref 13.0–17.0)
MCH: 30.3 pg (ref 26.0–34.0)
MCHC: 34.9 g/dL (ref 30.0–36.0)
MCV: 86.8 fL (ref 78.0–100.0)
PLATELETS: 199 10*3/uL (ref 150–400)
RBC: 4.99 MIL/uL (ref 4.22–5.81)
RDW: 12.4 % (ref 11.5–15.5)
WBC: 9.1 10*3/uL (ref 4.0–10.5)

## 2014-10-27 LAB — PROTIME-INR
INR: 1.07 (ref 0.00–1.49)
PROTHROMBIN TIME: 14 s (ref 11.6–15.2)

## 2014-10-27 LAB — URINALYSIS, ROUTINE W REFLEX MICROSCOPIC
BILIRUBIN URINE: NEGATIVE
GLUCOSE, UA: NEGATIVE mg/dL
HGB URINE DIPSTICK: NEGATIVE
KETONES UR: 15 mg/dL — AB
Leukocytes, UA: NEGATIVE
Nitrite: NEGATIVE
PH: 7 (ref 5.0–8.0)
Protein, ur: NEGATIVE mg/dL
Specific Gravity, Urine: 1.02 (ref 1.005–1.030)
Urobilinogen, UA: 0.2 mg/dL (ref 0.0–1.0)

## 2014-10-27 LAB — RAPID URINE DRUG SCREEN, HOSP PERFORMED
AMPHETAMINES: NOT DETECTED
BENZODIAZEPINES: NOT DETECTED
Barbiturates: NOT DETECTED
COCAINE: NOT DETECTED
Opiates: NOT DETECTED
Tetrahydrocannabinol: NOT DETECTED

## 2014-10-27 LAB — DIFFERENTIAL
BASOS PCT: 0 % (ref 0–1)
Basophils Absolute: 0 10*3/uL (ref 0.0–0.1)
EOS ABS: 0 10*3/uL (ref 0.0–0.7)
EOS PCT: 0 % (ref 0–5)
LYMPHS ABS: 0.8 10*3/uL (ref 0.7–4.0)
Lymphocytes Relative: 8 % — ABNORMAL LOW (ref 12–46)
Monocytes Absolute: 0.5 10*3/uL (ref 0.1–1.0)
Monocytes Relative: 5 % (ref 3–12)
Neutro Abs: 7.8 10*3/uL — ABNORMAL HIGH (ref 1.7–7.7)
Neutrophils Relative %: 87 % — ABNORMAL HIGH (ref 43–77)

## 2014-10-27 LAB — CBG MONITORING, ED: Glucose-Capillary: 124 mg/dL — ABNORMAL HIGH (ref 70–99)

## 2014-10-27 LAB — I-STAT CHEM 8, ED
BUN: 18 mg/dL (ref 6–23)
CHLORIDE: 102 meq/L (ref 96–112)
Calcium, Ion: 1.17 mmol/L (ref 1.13–1.30)
Creatinine, Ser: 0.7 mg/dL (ref 0.50–1.35)
GLUCOSE: 140 mg/dL — AB (ref 70–99)
HCT: 47 % (ref 39.0–52.0)
HEMOGLOBIN: 16 g/dL (ref 13.0–17.0)
Potassium: 3.8 mEq/L (ref 3.7–5.3)
SODIUM: 141 meq/L (ref 137–147)
TCO2: 24 mmol/L (ref 0–100)

## 2014-10-27 LAB — I-STAT TROPONIN, ED: TROPONIN I, POC: 0 ng/mL (ref 0.00–0.08)

## 2014-10-27 LAB — APTT: aPTT: 27 seconds (ref 24–37)

## 2014-10-27 LAB — ETHANOL

## 2014-10-27 MED ORDER — ACETAMINOPHEN 325 MG PO TABS
650.0000 mg | ORAL_TABLET | ORAL | Status: DC | PRN
  Administered 2014-10-28: 650 mg via ORAL
  Filled 2014-10-27: qty 2

## 2014-10-27 MED ORDER — ASPIRIN 300 MG RE SUPP: 300.0000 mg | Freq: Every day | RECTAL | Status: DC

## 2014-10-27 MED ORDER — KETOROLAC TROMETHAMINE 30 MG/ML IJ SOLN
15.0000 mg | Freq: Once | INTRAMUSCULAR | Status: AC
  Administered 2014-10-27: 15 mg via INTRAVENOUS
  Filled 2014-10-27: qty 1

## 2014-10-27 MED ORDER — ASPIRIN 325 MG PO TABS
325.0000 mg | ORAL_TABLET | Freq: Every day | ORAL | Status: DC
  Administered 2014-10-28: 325 mg via ORAL
  Filled 2014-10-27: qty 1

## 2014-10-27 MED ORDER — ENOXAPARIN SODIUM 40 MG/0.4ML ~~LOC~~ SOLN
40.0000 mg | SUBCUTANEOUS | Status: DC
  Administered 2014-10-27: 40 mg via SUBCUTANEOUS
  Filled 2014-10-27: qty 0.4

## 2014-10-27 MED ORDER — SODIUM CHLORIDE 0.9 % IV SOLN
INTRAVENOUS | Status: DC
  Administered 2014-10-27: 23:00:00 via INTRAVENOUS

## 2014-10-27 MED ORDER — ONDANSETRON HCL 4 MG/2ML IJ SOLN
4.0000 mg | Freq: Four times a day (QID) | INTRAMUSCULAR | Status: DC | PRN
  Administered 2014-10-27: 4 mg via INTRAVENOUS
  Filled 2014-10-27: qty 2

## 2014-10-27 MED ORDER — STROKE: EARLY STAGES OF RECOVERY BOOK
Freq: Once | Status: AC
  Administered 2014-10-27: 23:00:00
  Filled 2014-10-27: qty 1

## 2014-10-27 MED ORDER — ACETAMINOPHEN 650 MG RE SUPP: 650.0000 mg | RECTAL | Status: DC | PRN

## 2014-10-27 MED ORDER — SENNOSIDES-DOCUSATE SODIUM 8.6-50 MG PO TABS: 1.0000 | ORAL_TABLET | Freq: Every evening | ORAL | Status: DC | PRN

## 2014-10-27 MED ORDER — ONDANSETRON HCL 4 MG/2ML IJ SOLN
4.0000 mg | Freq: Once | INTRAMUSCULAR | Status: AC
  Administered 2014-10-27: 4 mg via INTRAVENOUS
  Filled 2014-10-27: qty 2

## 2014-10-27 MED ORDER — LORAZEPAM 2 MG/ML IJ SOLN
0.5000 mg | INTRAMUSCULAR | Status: DC | PRN
Start: 2014-10-27 — End: 2014-10-28
  Administered 2014-10-27: 0.5 mg via INTRAVENOUS
  Filled 2014-10-27: qty 1

## 2014-10-27 NOTE — Plan of Care (Signed)
Problem: Consults Goal: Ischemic Stroke Patient Education See Patient Education Module for education specifics. Outcome: Progressing Goal: Skin Care Protocol Initiated - if Braden Score 18 or less If consults are not indicated, leave blank or document N/A Outcome: Not Applicable Date Met:  01/00/71 Goal: Nutrition Consult-if indicated Outcome: Not Applicable Date Met:  21/97/58 Goal: Diabetes Guidelines if Diabetic/Glucose > 140 If diabetic or lab glucose is > 140 mg/dl - Initiate Diabetes/Hyperglycemia Guidelines & Document Interventions  Outcome: Progressing  Problem: Acute Treatment Outcomes Goal: Neuro exam at baseline or improved Outcome: Progressing Goal: BP within ordered parameters Outcome: Completed/Met Date Met:  10/27/14 Goal: Airway maintained/protected Outcome: Completed/Met Date Met:  10/27/14 Goal: 02 Sats > 94% Outcome: Completed/Met Date Met:  10/27/14 Goal: Hemodynamically stable Outcome: Completed/Met Date Met:  10/27/14 Goal: Prognosis discussed with family/patient as appropriate Outcome: Completed/Met Date Met:  10/27/14 Goal: Other Acute Treatment Outcomes Outcome: Completed/Met Date Met:  10/27/14  Comments:  Patient admitted to telemetry from ER with baseline NIHSS of 5. NIHSS currently 10 after receipt of IV ativan in radiology for MRI.  Drowsy, but arouses easily to verbal stimuli only. Answers both LOC questions inappropriately. Closes one eye only (inappropriate command response to "close your eyes"), closes both fists. Moderate receptive and expressive aphasia without dysarthria. Mild left-sided drift and mild left-sided ataxia present. Difficult assessment of extinction/neglect, but is able to raise hand/finger to indicate left/right/both; no discernible extinction/neglect.

## 2014-10-27 NOTE — Consult Note (Signed)
Referring Physician: Dr. Leonides Schanz    Chief Complaint: headache and speech difficulty.  HPI: Christopher Atkinson is an 73 y.o. male with a history of thoracic aortic aneurysm, aortic insufficiency and obstructive sleep apnea presenting with acute onset of speech output difficulty and confusion as well as complaint of severe headache. Patient was last known well at 8:30 this morning. CT scan of his head showed no acute intracranial abnormality. He's been taking aspirin 81 mg per day. NIH stroke score was 5, including moderately severe expressive aphasia with minimal receptive aphasia. Family members give a history of similar episodes in the past with speech output difficulty associated with complaint of severe headache, the last of which occurred in September 2015. He had an MRI study at that time which was unremarkable.  LSN: 8:30 AM on 10/27/2014 tPA Given: No: patient was beyond time window for treatment consideration mRankin:  Past Medical History  Diagnosis Date  . Thoracic aortic aneurysm   . Aortic insufficiency   . Obstructive sleep apnea     on CPAP    Family History  Problem Relation Age of Onset  . Heart Problems Father     CHF  . Stroke Brother      Medications: I have reviewed the patient's current medications.  ROS: History obtained from spouse  General ROS: negative for - chills, fatigue, fever, night sweats, weight gain or weight loss Psychological ROS: negative for - behavioral disorder, hallucinations, memory difficulties, mood swings or suicidal ideation Ophthalmic ROS: negative for - blurry vision, double vision, eye pain or loss of vision ENT ROS: negative for - epistaxis, nasal discharge, oral lesions, sore throat, tinnitus or vertigo Allergy and Immunology ROS: negative for - hives or itchy/watery eyes Hematological and Lymphatic ROS: negative for - bleeding problems, bruising or swollen lymph nodes Endocrine ROS: negative for - galactorrhea, hair pattern changes,  polydipsia/polyuria or temperature intolerance Respiratory ROS: negative for - cough, hemoptysis, shortness of breath or wheezing Cardiovascular ROS: negative for - chest pain, dyspnea on exertion, edema or irregular heartbeat Gastrointestinal ROS: negative for - abdominal pain, diarrhea, hematemesis, nausea/vomiting or stool incontinence Genito-Urinary ROS: negative for - dysuria, hematuria, incontinence or urinary frequency/urgency Musculoskeletal ROS: negative for - joint swelling or muscular weakness Neurological ROS: as noted in HPI Dermatological ROS: negative for rash and skin lesion changes  Physical Examination: Blood pressure 162/77, pulse 92, temperature 98 F (36.7 C), temperature source Oral, resp. rate 18, height 5\' 11"  (1.803 m), weight 83.915 kg (185 lb), SpO2 98 %.  Neurologic Examination: Mental Status: Alert, with moderately severe expressive aphasia as well as mild receptive aphasia. . Moderate difficulty understanding commands. Cranial Nerves: II-Visual fields were normal. III/IV/VI-Pupils were equal and reacted. Extraocular movements were full and conjugate.    V/VII-no facial numbness and no facial weakness. VIII-normal. X-normal speech with no dysarthria. Motor: 5/5 bilaterally with normal tone and bulk Sensory: difficult to assess. Deep Tendon Reflexes: 2+ and symmetric. Plantars: Flexor bilaterally Cerebellar: Normal finger-to-nose testing.  Ct Head Wo Contrast  10/27/2014   CLINICAL DATA:  Cerebral vascular accident. Headache, confusion and unsteady gait. Altered mental status.  EXAM: CT HEAD WITHOUT CONTRAST  TECHNIQUE: Contiguous axial images were obtained from the base of the skull through the vertex without intravenous contrast.  COMPARISON:  08/02/2014  FINDINGS: No mass lesion. No midline shift. No acute hemorrhage or hematoma. No extra-axial fluid collections. No evidence of acute infarction. Dilated perivascular space at the base of the left basal  ganglia, unchanged. Tiny old  white matter infarct in the left posterior parietal region is new since the prior study.  There is slight diffuse atrophy.  No osseous abnormality.  IMPRESSION: No acute intracranial abnormality. 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   Electronically Signed   By: Rozetta Nunnery M.D.   On: 10/27/2014 14:04    Assessment: 73 y.o. male presenting with acute aphasia as well as complaint of severe headache. Etiology is unclear. TIA or acute left MCA infarction cannot be ruled out. As well, given the history of the patient's transient speech abnormality and associated headache with repeated pattern, partial seizure disorder cannot be ruled out.  Stroke Risk Factors - none  Plan: 1. EEG, stat recording while patient is symptomatic 2. MRI, MRA  of the brain without contrast 3. PT consult, OT consult, Speech consult 4. Echocardiogram 5. Carotid dopplers 6. Prophylactic therapy-Antiplatelet med: Aspirin  7. Hemoglobin A1c and fasting lipid panel 8. Telemetry monitoring   C.R. Nicole Kindred, MD [facility] [phone]  10/27/2014, 2:55 PM

## 2014-10-27 NOTE — ED Provider Notes (Signed)
TIME SEEN: 2:14 PM  CHIEF COMPLAINT: altered mental status, aphasia, headache, code stroke  HPI: Pt is a 73 y.o. male with history of thoracic aortic aneurysm, aortic insufficiency, obstructive sleep apnea who presents to the emergency department with an episode of aphasia, altered mental status, headache that started 8:30 this morning. Symptoms have been intermittent. They seem to be improving. Patient is unable to follow commands or answer questions appropriately.   PCP is Dr. Rex Kras with [facility]   ROS: level V caveat for altered mental status  PAST MEDICAL HISTORY/PAST SURGICAL HISTORY:  Past Medical History  Diagnosis Date  . Thoracic aortic aneurysm   . Aortic insufficiency   . Obstructive sleep apnea     on CPAP    MEDICATIONS:  Prior to Admission medications   Medication Sig Start Date End Date Taking? Authorizing Provider  aspirin 81 MG tablet Take 81 mg by mouth daily.    Historical Provider, MD  calcium-vitamin D (OSCAL WITH D) 500-200 MG-UNIT per tablet Take 1 tablet by mouth daily with breakfast.    Historical Provider, MD  Coenzyme Q10 (CO Q-10) 100 MG CAPS Take 100 mg by mouth daily.    Historical Provider, MD  Multiple Vitamins-Minerals ([employer]S SPECTRAVITE ADULT 50+ PO) Take 1 tablet by mouth daily.    Historical Provider, MD  Nutritional Supplements (JUICE PLUS FIBRE PO) Take 2 capsules by mouth daily. Garden blend and orchard blend    Historical Provider, MD  Nutritional Supplements (PROSTAMEN PO) Take 1 capsule by mouth daily.    Historical Provider, MD  Omega-3 Fatty Acids (OMEGA-3 PLUS PO) Take 1 tablet by mouth daily.    Historical Provider, MD  ondansetron (ZOFRAN ODT) 4 MG disintegrating tablet Take 1 tablet (4 mg total) by mouth every 8 (eight) hours as needed for nausea or vomiting. 09/13/14   Tammy Sours, MD  Potassium 99 MG TABS Take 99 mg by mouth daily.    Historical Provider, MD  Saw Palmetto 450 MG CAPS Take 450 mg by mouth daily.    Historical Provider,  MD    ALLERGIES:  No Known Allergies  SOCIAL HISTORY:  History  Substance Use Topics  . Smoking status: Former Research scientist (life sciences)  . Smokeless tobacco: Not on file     Comment: quit smoking when he was 72 years old  . Alcohol Use: No    FAMILY HISTORY: Family History  Problem Relation Age of Onset  . Heart Problems Father     CHF  . Stroke Brother     EXAM: BP 156/73 mmHg  Pulse 87  Temp(Src) 98 F (36.7 C) (Oral)  Resp 19  Ht 5\' 11"  (1.803 m)  Wt 185 lb (83.915 kg)  BMI 25.81 kg/m2  SpO2 100% CONSTITUTIONAL: Alert but does not answer questions appropriately or follow commands consistently, no distress HEAD: Normocephalic EYES: Conjunctivae clear, PERRL ENT: normal nose; no rhinorrhea; moist mucous membranes; pharynx without lesions noted NECK: Supple, no meningismus, no LAD  CARD: RRR; S1 and S2 appreciated; no murmurs, no clicks, no rubs, no gallops RESP: Normal chest excursion without splinting or tachypnea; breath sounds clear and equal bilaterally; no wheezes, no rhonchi, no rales,  ABD/GI: Normal bowel sounds; non-distended; soft, non-tender, no rebound, no guarding BACK:  The back appears normal and is non-tender to palpation, there is no CVA tenderness EXT: Normal ROM in all joints; non-tender to palpation; no edema; normal capillary refill; no cyanosis    SKIN: Normal color for age and race; warm NEURO: Moves  all extremities equally; no pronator drift, no slurred speech or patient is a phasic.  NIH scale is 5. Unable to follow commands appropriately or answer questions appropriately. PSYCH: The patient's mood and manner are appropriate. Grooming and personal hygiene are appropriate.  MEDICAL DECISION MAKING: Patient here as a code stroke. He is outside of any TPA window. Head CT shows no acute intracranial abnormality. Dr. Nicole Kindred with neurology has seen patient and agrees with MRI, possible EEG and admission to medicine. Labs pending. Family updated.  ED PROGRESS:  Discussed with hospitalist for admission to inpatient, telemetry.  We'll give medication for headache and nausea.      EKG Interpretation  Date/Time:  Thursday October 27 2014 14:06:37 EST Ventricular Rate:  86 PR Interval:  131 QRS Duration: 102 QT Interval:  404 QTC Calculation: 483 R Axis:   87 Text Interpretation:  Sinus rhythm Inferior infarct, old Abnrm T, consider ischemia, anterolateral lds Confirmed by Kielyn Kardell,  DO, Leelan Rajewski [phone]) on 10/27/2014 2:14:44 PM        Punta Rassa, DO 10/27/14 1526

## 2014-10-27 NOTE — Progress Notes (Signed)
EEG completed, results pending. 

## 2014-10-27 NOTE — ED Notes (Signed)
Pt eating food. Developed nausea. Admitting MD paged.

## 2014-10-27 NOTE — Plan of Care (Signed)
Problem: Progression Outcomes Goal: Educational plan initiated Outcome: Completed/Met Date Met:  10/27/14     

## 2014-10-27 NOTE — Code Documentation (Signed)
73yo male arriving to [facility] via private vehicle at 1339.  Patient presenting with aphasia, Code Stroke called in Triage.  Patient taken to CT.  Stroke team to the bedside.  Initial NIHSS 5, see documentation for details and code stroke times.  Patient's family reports that they noticed patient to have slurred speech and not acting himself at 0830 this morning.  Patient went to work and his coworkers brought him home d/t symptoms.  Patient with continued global aphasia.  Patient is outside the window for treatment with tPA.  No acute stroke treatment at this time per Dr. Nicole Kindred. Bedside handoff with ED RN Margreta Journey.

## 2014-10-27 NOTE — Procedures (Signed)
ELECTROENCEPHALOGRAM REPORT  Patient: Christopher Atkinson       Room #: ED 48 EEG No. ID: [id number] Age: 73 y.o.        Sex: male Referring Physician: Nena Alexander Report Date:  10/27/2014        Interpreting Physician: Anthony Sar  History: Christopher Atkinson is an 73 y.o. male presenting with acute aphasia and complaint of severe headache. Patient has had similar transient symptoms but not as severe.  Indications for study:  Rule out show status epilepticus.  Technique: This is an 18 channel routine scalp EEG performed at the bedside with bipolar and monopolar montages arranged in accordance to the international 10/20 system of electrode placement.   Description: This EEG recording was performed during wakefulness. Background activity consisted of diffuse asymmetric slowing of cerebral activity which was more pronounced involving left hemisphere, in most pronounced in the left temporal region. Photic stimulation was not performed. No epileptiform discharges were recorded.  Interpretation: This EEG is abnormal with diffuse slowing of moderate severity which was worse on the left side than right, and most pronounced involving left temporal region. These findings may indicate an acute ischemic abnormality involving left MCA territory. No evidence of seizure activity was demonstrated.   Rush Farmer M.D. [facility] [phone]

## 2014-10-27 NOTE — ED Notes (Addendum)
Pt in with family c/o confusion, unsteady gait, pt will not follow commands, does not answer questions appropriately,family changed last seen normal at 0830am, symptoms were noted by his work staff at 10am and he was taken home, CBG 124

## 2014-10-27 NOTE — ED Notes (Signed)
Pt outside window for TPA

## 2014-10-27 NOTE — H&P (Signed)
PATIENT DETAILS Name: Christopher Atkinson Age: 73 y.o. Sex: male Date of Birth: 06/05/1941 Admit Date: 10/27/2014 AYT:KZSWFU,XNATF, MD   CHIEF COMPLAINT:  Speech difficulty since this morning  HPI: Christopher Atkinson is a 73 y.o. male with a Past Medical History ofmigraine headaches, hypertension, known 5 cm thoracic aortic aneurysm, aortic insufficiency, obstructive sleep apnea on CPAP who presents today with the above noted complaint.please note, most of this history is obtained from the spouse at bedside. Apparently this morning, patient woke up with a headache and was just not feeling "good". He subsequently went to work, where his family and other coworker snoted that patient had expressive aphasia and did not seem his usual self.family apparently also noted some difficulty with gait, claimed that the patient walked into a car at work. He was subsequently brought to the hospital by family members, CT of the head was negative for acute abnormalities, the hospitalist service was consulted to admit this patient for further evaluation and treatment. Per family, no known fever, nausea, vomiting. No history of chest pain or shortness of breath.   ALLERGIES:  No Known Allergies  PAST MEDICAL HISTORY: Past Medical History  Diagnosis Date  . Thoracic aortic aneurysm   . Aortic insufficiency   . Obstructive sleep apnea     on CPAP    PAST SURGICAL HISTORY: History reviewed. No pertinent past surgical history.  MEDICATIONS AT HOME: Prior to Admission medications   Medication Sig Start Date End Date Taking? Authorizing Provider  aspirin 81 MG tablet Take 81 mg by mouth daily.    Historical Provider, MD  calcium-vitamin D (OSCAL WITH D) 500-200 MG-UNIT per tablet Take 1 tablet by mouth daily with breakfast.    Historical Provider, MD  Coenzyme Q10 (CO Q-10) 100 MG CAPS Take 100 mg by mouth daily.    Historical Provider, MD  Multiple Vitamins-Minerals ([employer] SPECTRAVITE ADULT 50+ PO) Take 1  tablet by mouth daily.    Historical Provider, MD  Nutritional Supplements (JUICE PLUS FIBRE PO) Take 2 capsules by mouth daily. Garden blend and orchard blend    Historical Provider, MD  Nutritional Supplements (PROSTAMEN PO) Take 1 capsule by mouth daily.    Historical Provider, MD  Omega-3 Fatty Acids (OMEGA-3 PLUS PO) Take 1 tablet by mouth daily.    Historical Provider, MD  ondansetron (ZOFRAN ODT) 4 MG disintegrating tablet Take 1 tablet (4 mg total) by mouth every 8 (eight) hours as needed for nausea or vomiting. 09/13/14   Tammy Sours, MD  Potassium 99 MG TABS Take 99 mg by mouth daily.    Historical Provider, MD  Saw Palmetto 450 MG CAPS Take 450 mg by mouth daily.    Historical Provider, MD    FAMILY HISTORY: Family History  Problem Relation Age of Onset  . Heart Problems Father     CHF  . Stroke Brother     SOCIAL HISTORY:  reports that he has quit smoking. He does not have any smokeless tobacco history on file. He reports that he does not drink alcohol. His drug history is not on file.  REVIEW OF SYSTEMS: obtained from family Constitutional:   No  weight loss, night sweats,  Fevers, chills, fatigue.  HEENT:    No headaches, Difficulty swallowing,Tooth/dental problems,Sore throat,   Cardio-vascular: No chest pain,  Orthopnea, PND, swelling in lower extremities, anasarca  GI:  No heartburn, indigestion, abdominal pain, nausea, vomiting, diarrhea, change in bowel habits, loss of appetite  Resp:  No shortness of breath with exertion or at rest.  No excess mucus, no productive cough, No non-productive cough,  No coughing up of blood.No change in color of mucus.  Skin:  no rash or lesions.  GU:  no dysuria, change in color of urine, no urgency or frequency.  No flank pain.  Musculoskeletal: No joint pain or swelling.  No decreased range of motion.  No back pain.  Psych: No change in mood or affect. No depression or anxiety.  No memory loss.   PHYSICAL  EXAM: Blood pressure 142/69, pulse 83, temperature 98 F (36.7 C), temperature source Oral, resp. rate 13, height 5\' 11"  (1.803 m), weight 83.915 kg (185 lb), SpO2 99 %.  General appearance :Awake, alert, +some expressive aphasia, sometimes able to answer questions appropriately however. HEENT: Atraumatic and Normocephalic, pupils equally reactive to light and accomodation Neck: supple, no JVD. No cervical lymphadenopathy.  Chest:Good air entry bilaterally, no added sounds  CVS: S1 S2 regular, no murmurs.  Abdomen: Bowel sounds present, Non tender and not distended with no gaurding, rigidity or rebound. Extremities: B/L Lower Ext shows no edema, both legs are warm to touch Neurology:  CN II-XII intact, Non focal Skin:No Rash Wounds:N/A  LABS ON ADMISSION:   Recent Labs  10/27/14 1403 10/27/14 1416  NA 142 141  K 4.1 3.8  CL 103 102  CO2 25  --   GLUCOSE 136* 140*  BUN 18 18  CREATININE 0.64 0.70  CALCIUM 9.3  --     Recent Labs  10/27/14 1403  AST 32  ALT 27  ALKPHOS 75  BILITOT 1.2  PROT 7.3  ALBUMIN 3.9   No results for input(s): LIPASE, AMYLASE in the last 72 hours.  Recent Labs  10/27/14 1403 10/27/14 1416  WBC 9.1  --   NEUTROABS 7.8*  --   HGB 15.1 16.0  HCT 43.3 47.0  MCV 86.8  --   PLT 199  --    No results for input(s): CKTOTAL, CKMB, CKMBINDEX, TROPONINI in the last 72 hours. No results for input(s): DDIMER in the last 72 hours. Invalid input(s): POCBNP   RADIOLOGIC STUDIES ON ADMISSION: Ct Head Wo Contrast  10/27/2014   CLINICAL DATA:  Cerebral vascular accident. Headache, confusion and unsteady gait. Altered mental status.  EXAM: CT HEAD WITHOUT CONTRAST  TECHNIQUE: Contiguous axial images were obtained from the base of the skull through the vertex without intravenous contrast.  COMPARISON:  08/02/2014  FINDINGS: No mass lesion. No midline shift. No acute hemorrhage or hematoma. No extra-axial fluid collections. No evidence of acute infarction.  Dilated perivascular space at the base of the left basal ganglia, unchanged. Tiny old white matter infarct in the left posterior parietal region is new since the prior study.  There is slight diffuse atrophy.  No osseous abnormality.  IMPRESSION: No acute intracranial abnormality. 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   Electronically Signed   By: Rozetta Nunnery M.D.   On: 10/27/2014 14:04     EKG: Independently reviewed. NSR  ASSESSMENT AND PLAN: Present on Admission:  . Suspected Acute CVA (cerebrovascular accident):symptomatology likely suggestive of acute CVA. However symptoms started after having a headache, complicated migraine is definitely in the differential. Neurology recommending EEG while patient having ongoing symptoms to make sure no seizures. Will admit, monitor in telemetry. MRI brain, carotid Doppler and echocardiogram will be ordered. A1c and lipid panel will be checked as well. Patient is on 81 mg of aspirin prior to  admission, we will increase to 325 mg.  . Thoracic aortic aneurysm:Being followed in the outpatient by both cardiothoracic surgery and cardiology-apparently stable 5 cm aneurysm for the past 3 years, and defer further management to the outpatient setting.  . Obstructive sleep apnea:Continue CPAP  . Aortic insufficiency:Chronic issue, await echocardiogram.   Further plan will depend as patient's clinical course evolves and further radiologic and laboratory data become available. Patient will be monitored closely.   Above noted plan was discussed with spouse/daughter, they were in agreement.   DVT Prophylaxis: Prophylactic Lovenox  Code Status: Full Code  Disposition Plan: Home when work up complete   Total time spent for admission equals 45 minutes.  [facility] Pager [phone]  If 7PM-7AM, please contact night-coverage [email or web address] Password [facility] 10/27/2014, 3:42 PM

## 2014-10-28 ENCOUNTER — Inpatient Hospital Stay (HOSPITAL_COMMUNITY): Payer: Medicare HMO

## 2014-10-28 DIAGNOSIS — R4182 Altered mental status, unspecified: Secondary | ICD-10-CM

## 2014-10-28 DIAGNOSIS — I359 Nonrheumatic aortic valve disorder, unspecified: Secondary | ICD-10-CM

## 2014-10-28 DIAGNOSIS — Z8669 Personal history of other diseases of the nervous system and sense organs: Secondary | ICD-10-CM

## 2014-10-28 LAB — LIPID PANEL
Cholesterol: 128 mg/dL (ref 0–200)
HDL: 35 mg/dL — ABNORMAL LOW (ref >39)
LDL Cholesterol: 79 mg/dL (ref 0–99)
Total CHOL/HDL Ratio: 3.7 RATIO
Triglycerides: 70 mg/dL (ref <150)
VLDL: 14 mg/dL (ref 0–40)

## 2014-10-28 LAB — HEMOGLOBIN A1C
HEMOGLOBIN A1C: 5.9 % — AB (ref <5.7)
MEAN PLASMA GLUCOSE: 123 mg/dL — AB (ref <117)

## 2014-10-28 MED ORDER — INFLUENZA VAC SPLIT QUAD 0.5 ML IM SUSY: 0.5000 mL | PREFILLED_SYRINGE | INTRAMUSCULAR | Status: DC

## 2014-10-28 MED ORDER — INFLUENZA VAC SPLIT QUAD 0.5 ML IM SUSY
0.5000 mL | PREFILLED_SYRINGE | INTRAMUSCULAR | Status: AC | PRN
  Administered 2014-10-28: 0.5 mL via INTRAMUSCULAR
  Filled 2014-10-28: qty 0.5

## 2014-10-28 MED ORDER — TOPIRAMATE 50 MG PO TABS: 50.0000 mg | ORAL_TABLET | Freq: Two times a day (BID) | ORAL | Status: DC

## 2014-10-28 MED ORDER — ASPIRIN 325 MG PO TABS: 325.0000 mg | ORAL_TABLET | Freq: Every day | ORAL | Status: DC

## 2014-10-28 MED ORDER — ATORVASTATIN CALCIUM 10 MG PO TABS: 10.0000 mg | ORAL_TABLET | Freq: Every day | ORAL | Status: DC

## 2014-10-28 MED ORDER — BUTALBITAL-APAP-CAFFEINE 50-325-40 MG PO TABS: 1.0000 | ORAL_TABLET | Freq: Four times a day (QID) | ORAL | Status: DC | PRN

## 2014-10-28 NOTE — Care Management Note (Addendum)
    Page 1 of 1   10/28/2014     4:29:42 PM CARE MANAGEMENT NOTE 10/28/2014  Patient:  Christopher Atkinson, Christopher Atkinson   Account Number:  [id number]  Date Initiated:  10/28/2014  Documentation initiated by:  GRAVES-BIGELOW,Alvester Eads  Subjective/Objective Assessment:   Pt admitted for severe headache and acute aphasia.     Action/Plan:   CM to speak to pt in ref to disposition needs. MD was at bedside at time of visit.   Anticipated DC Date:  10/29/2014   Anticipated DC Plan:  [place]  CM consult      [facility] Choice  HOME HEALTH   Choice offered to / List presented to:  C-3 Spouse        HH arranged  [place].   Status of service:  Completed, signed off Medicare Important Message given?  YES (If response is "NO", the following Medicare IM given date fields will be blank) Date Medicare IM given:  10/28/2014 Medicare IM given by:  GRAVES-BIGELOW,Ayano Douthitt Date Additional Medicare IM given:   Additional Medicare IM given by:    Discharge Disposition:  [place]  Per UR Regulation:  Reviewed for med. necessity/level of care/duration of stay  If discussed at [place] of Stay Meetings, dates discussed:    Comments:  10-28-14 1628 Jacqlyn Krauss, RN,BSN [phone] CM did make referral with [facility] for [place] services. SOC to begin within 24-48 hrs post d/c.

## 2014-10-28 NOTE — Progress Notes (Signed)
UR Completed Shyheem Whitham Graves-Bigelow, RN,BSN [phone]  

## 2014-10-28 NOTE — Evaluation (Signed)
Speech Language Pathology Evaluation Patient Details Name: Christopher Atkinson MRN: [id number] DOB: 01-20-1941 Today's Date: 10/28/2014 Time: 2297-9892 SLP Time Calculation (min): 42 min  Problem List:  Patient Active Problem List   Diagnosis Date Noted  . Acute CVA (cerebrovascular accident) 10/27/2014  . Aphasia   . Thoracic aortic aneurysm 11/04/2013  . Aortic insufficiency 11/04/2013  . Obstructive sleep apnea 11/04/2013   Past Medical History:  Past Medical History  Diagnosis Date  . Thoracic aortic aneurysm   . Aortic insufficiency   . Obstructive sleep apnea     on CPAP   Past Surgical History: History reviewed. No pertinent past surgical history. HPI:  Christopher Atkinson is a 73 y.o. male with PMH of migraine headaches, hypertension, known 5 cm thoracic aortic aneurysm, aortic insufficiency, and OSA, who presented with headache and expressive aphasia. MRI was negative for acute stroke and EEG without evidence of seizure activity. Complicated migraine is in the differential diagnosis.   Assessment / Plan / Recommendation Clinical Impression  Pt presents with moderate impairments with mildly complex receptive and expressive language with reduced intellectual, emergent, and anticipatory awareness of deficits. Pt requires Max cues from therapist for awareness and storage of new information. SLP also provided Mod cues for verbal sequencing of basic, very familiar task. Highly recommend f/u [facility] SLP services to maximize cognitive and communicative function, and to maximize safety given very limited insight into current level of function. SLP to follow acutely to facilitate transition to home with 24/7 supervision/    SLP Assessment  Patient needs continued Speech Lanaguage Pathology Services    Follow Up Recommendations  Home health SLP;24 hour supervision/assistance    Frequency and Duration min 2x/week  2 weeks   Pertinent Vitals/Pain Pain Assessment: No/denies pain   SLP Goals   Patient/Family Stated Goal: pt wants to go home Potential to Achieve Goals: Fair Potential Considerations: Ability to learn/carryover information  SLP Evaluation Prior Functioning  Cognitive/Linguistic Baseline: Within functional limits Type of Home: House  Lives With: Spouse Available Help at Discharge: Family;Available 24 hours/day Vocation: Full time employment   Cognition  Overall Cognitive Status: Impaired/Different from baseline Arousal/Alertness: Awake/alert Orientation Level: Oriented X4 Attention: Sustained Sustained Attention: Impaired Sustained Attention Impairment: Verbal basic Memory: Impaired Memory Impairment: Storage deficit;Retrieval deficit;Decreased recall of new information Awareness: Impaired Awareness Impairment: Intellectual impairment;Emergent impairment;Anticipatory impairment Problem Solving: Impaired Problem Solving Impairment: Verbal basic;Functional basic Executive Function: Self Monitoring;Self Correcting Self Monitoring: Impaired Self Monitoring Impairment: Verbal basic Self Correcting: Impaired Self Correcting Impairment: Verbal basic Safety/Judgment: Impaired    Comprehension  Auditory Comprehension Overall Auditory Comprehension: Impaired Yes/No Questions: Impaired Complex Questions: 75-100% accurate Paragraph Comprehension (via yes/no questions): 0-25% accurate Commands: Impaired One Step Basic Commands: 75-100% accurate Two Step Basic Commands: 75-100% accurate Multistep Basic Commands: 75-100% accurate Conversation: Simple Interfering Components: Attention;Working memory EffectiveTechniques: Repetition;Slowed speech Visual Recognition/Discrimination Discrimination: Not tested Reading Comprehension Reading Status: Not tested    Expression Expression Primary Mode of Expression: Verbal Verbal Expression Overall Verbal Expression: Impaired Initiation: No impairment Level of Generative/Spontaneous Verbalization:  Sentence Naming: Impairment Responsive: 76-100% accurate Confrontation: Within functional limits Divergent: 25-49% accurate Verbal Errors: Other (comment) (anomia) Pragmatics: Impairment Impairments: Topic maintenance Interfering Components: Attention Non-Verbal Means of Communication: Not applicable Written Expression Written Expression: Not tested   Oral / Motor Oral Motor/Sensory Function Overall Oral Motor/Sensory Function: Appears within functional limits for tasks assessed Motor Speech Overall Motor Speech: Appears within functional limits for tasks assessed   GO Functional Assessment Tool Used: skilled clinical judgment  Functional Limitations: Spoken language comprehension Spoken Language Comprehension Current Status [phone]): At least 40 percent but less than 60 percent impaired, limited or restricted Spoken Language Comprehension Goal Status [phone]): At least 20 percent but less than 40 percent impaired, limited or restricted    Germain Osgood, M.A. CCC-SLP [phone]  Germain Osgood 10/28/2014, 3:08 PM

## 2014-10-28 NOTE — Discharge Instructions (Signed)
STROKE/TIA DISCHARGE INSTRUCTIONS SMOKING Cigarette smoking nearly doubles your risk of having a stroke & is the single most alterable risk factor  If you smoke or have smoked in the last 12 months, you are advised to quit smoking for your health.  Most of the excess cardiovascular risk related to smoking disappears within a year of stopping.  Ask you doctor about anti-smoking medications  [place] Quit Line: 1-800-QUIT NOW  Free Smoking Cessation Classes [phone]  CHOLESTEROL Know your levels; limit fat & cholesterol in your diet  Lipid Panel     Component Value Date/Time   CHOL 128 10/28/2014 0318   TRIG 70 10/28/2014 0318   HDL 35* 10/28/2014 0318   CHOLHDL 3.7 10/28/2014 0318   VLDL 14 10/28/2014 0318   LDLCALC 79 10/28/2014 0318      Many patients benefit from treatment even if their cholesterol is at goal.  Goal: Total Cholesterol (CHOL) less than 160  Goal:  Triglycerides (TRIG) less than 150  Goal:  HDL greater than 40  Goal:  LDL (LDLCALC) less than 100   BLOOD PRESSURE American Stroke Association blood pressure target is less that 120/80 mm/Hg  Your discharge blood pressure is:  BP: (!) 97/59 mmHg  Monitor your blood pressure  Limit your salt and alcohol intake  Many individuals will require more than one medication for high blood pressure  DIABETES (A1c is a blood sugar average for last 3 months) Goal HGBA1c is under 7% (HBGA1c is blood sugar average for last 3 months)  Diabetes: No known diagnosis of diabetes    No results found for: HGBA1C   Your HGBA1c can be lowered with medications, healthy diet, and exercise.  Check your blood sugar as directed by your physician  Call your physician if you experience unexplained or low blood sugars.  PHYSICAL ACTIVITY/REHABILITATION Goal is 30 minutes at least 4 days per week  Activity: Increase activity slowly, Therapies: Physical Therapy: Home Health Return to work:    Activity decreases your risk of heart  attack and stroke and makes your heart stronger.  It helps control your weight and blood pressure; helps you relax and can improve your mood.  Participate in a regular exercise program.  Talk with your doctor about the best form of exercise for you (dancing, walking, swimming, cycling).  DIET/WEIGHT Goal is to maintain a healthy weight  Your discharge diet is: Diet Heart Diet - low sodium heart healthy Diet - low sodium heart healthy  liquids Your height is:  Height: 5\' 11"  (180.3 cm) Your current weight is: Weight: 83.915 kg (185 lb) Your Body Mass Index (BMI) is:  BMI (Calculated): 25.9  Following the type of diet specifically designed for you will help prevent another stroke.  Your goal weight range is:    Your goal Body Mass Index (BMI) is 19-24.  Healthy food habits can help reduce 3 risk factors for stroke:  High cholesterol, hypertension, and excess weight.  RESOURCES Stroke/Support Group:  Call [phone]   STROKE EDUCATION PROVIDED/REVIEWED AND GIVEN TO PATIENT Stroke warning signs and symptoms How to activate emergency medical system (call 911). Medications prescribed at discharge. Need for follow-up after discharge. Personal risk factors for stroke. Pneumonia vaccine given: No Flu vaccine given: Yes, Date 10/28/2014 My questions have been answered, the writing is legible, and I understand these instructions.  I will adhere to these goals & educational materials that have been provided to me after my discharge from the hospital.

## 2014-10-28 NOTE — Progress Notes (Signed)
STROKE TEAM PROGRESS NOTE   HISTORY Natalio Torry is an 73 y.o. male with a history of thoracic aortic aneurysm, aortic insufficiency and obstructive sleep apnea presenting with acute onset of speech output difficulty and confusion as well as complaint of severe headache. Patient was last known well at 8:30 this morning. CT scan of his head showed no acute intracranial abnormality. He's been taking aspirin 81 mg per day. NIH stroke score was 5, including moderately severe expressive aphasia with minimal receptive aphasia. Family members give a history of similar episodes in the past with speech output difficulty associated with complaint of severe headache, the last of which occurred in September 2015. He had an MRI study at that time which was unremarkable.  LSN: 8:30 AM on 10/27/2014 tPA Given: No: patient was beyond time window for treatment consideration   SUBJECTIVE (INTERVAL HISTORY) The patient's wife and daughter are present. They had multiple questions and concerns. The patient is somewhat confused. He feels he is in the hospital for vertigo. Based on the history it would seem that the patient has a long history of complicated migraine headaches. His migraine also associated with visual aura and perioral numbness as well as left UE and LE numbness. For the last 6 months, his HA spells as frequent as one pre week. This time his HA also associated with confusion and speech difficulty. MRI neg fpr stroke    OBJECTIVE     Recent Labs Lab 10/27/14 1345  GLUCAP 124*    Recent Labs Lab 10/27/14 1403 10/27/14 1416  NA 142 141  K 4.1 3.8  CL 103 102  CO2 25  --   GLUCOSE 136* 140*  BUN 18 18  CREATININE 0.64 0.70  CALCIUM 9.3  --     Recent Labs Lab 10/27/14 1403  AST 32  ALT 27  ALKPHOS 75  BILITOT 1.2  PROT 7.3  ALBUMIN 3.9    Recent Labs Lab 10/27/14 1403 10/27/14 1416  WBC 9.1  --   NEUTROABS 7.8*  --   HGB 15.1 16.0  HCT 43.3 47.0  MCV 86.8  --   PLT 199   --    No results for input(s): CKTOTAL, CKMB, CKMBINDEX, TROPONINI in the last 168 hours.  Recent Labs  10/27/14 1403  LABPROT 14.0  INR 1.07    Recent Labs  10/27/14 1550  COLORURINE YELLOW  LABSPEC 1.020  PHURINE 7.0  GLUCOSEU NEGATIVE  HGBUR NEGATIVE  BILIRUBINUR NEGATIVE  KETONESUR 15*  PROTEINUR NEGATIVE  UROBILINOGEN 0.2  NITRITE NEGATIVE  LEUKOCYTESUR NEGATIVE       Component Value Date/Time   CHOL 128 10/28/2014 0318   TRIG 70 10/28/2014 0318   HDL 35* 10/28/2014 0318   CHOLHDL 3.7 10/28/2014 0318   VLDL 14 10/28/2014 0318   LDLCALC 79 10/28/2014 0318   Lab Results  Component Value Date   HGBA1C 5.9* 10/28/2014      Component Value Date/Time   LABOPIA NONE DETECTED 10/27/2014 1550   COCAINSCRNUR NONE DETECTED 10/27/2014 1550   LABBENZ NONE DETECTED 10/27/2014 1550   AMPHETMU NONE DETECTED 10/27/2014 1550   THCU NONE DETECTED 10/27/2014 1550   LABBARB NONE DETECTED 10/27/2014 1550     Recent Labs Lab 10/27/14 1403  ETH <11    Dg Chest 2 View 10/28/2014    No active cardiopulmonary disease.     Ct Head Wo Contrast 10/27/2014    No acute intracranial abnormality. 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  Mr Brain Wo Contrast  10/27/2014    1. No acute intracranial infarct or other abnormality identified.  2. Age-related atrophy with mild to moderate chronic microvascular ischemic disease.      [name] Head/brain Wo Cm 10/27/2014    1. Moderate short-segment stenosis within the mid right P2 segment. Right PCA is well opacified distally.  2. Otherwise unremarkable MRA of the intracranial circulation with no proximal branch occlusion or hemodynamically significant stenosis identified.     2D echo - - Left ventricle: The cavity size was normal. Wall thickness was increased in a pattern of mild LVH. There was focal basal hypertrophy. Systolic function was normal. The estimated ejection fraction was in the range of  60% to 65%. Wall motion was normal; there were no regional wall motion abnormalities. Doppler parameters are consistent with abnormal left ventricular relaxation (grade 1 diastolic dysfunction). - Aortic valve: There was mild regurgitation.   PHYSICAL EXAM Physical exam  General - Well nourished, well developed, in no apparent distress.  Ophthalmologic - not able to see through.  Cardiovascular - Regular rate and rhythm with no murmur.  Mental Status -  Level of arousal and orientation to person were intact time, but not to place, and time. Language including expression, naming, repetition, comprehension was assessed and found intact.  Cranial Nerves II - XII - II - Visual field intact OU. III, IV, VI - Extraocular movements intact. V - Facial sensation intact bilaterally. VII - Facial movement intact bilaterally. VIII - Hearing & vestibular intact bilaterally. X - Palate elevates symmetrically. XI - Chin turning & shoulder shrug intact bilaterally XII - Tongue protrusion intact.  Motor Strength - The patient's strength was normal in all extremities and pronator drift was absent.  Bulk was normal and fasciculations were absent.   Motor Tone - Muscle tone was assessed at the neck and appendages and was normal.  Reflexes - The patient's reflexes were normal in all extremities and he had no pathological reflexes.  Sensory - Light touch, temperature/pinprick were assessed and were normal.    Coordination - The patient had normal movements in the hands and feet with no ataxia or dysmetria.  Tremor was absent.  Gait and Station - not to tested.   ASSESSMENT/PLAN Mr. Baldo Hufnagle is a 73 y.o. male with history of ,migraine headaches, ,obstructive sleep apnea, aortic insufficiency, and known 5 cm aortic aneurysm,  presenting with confusion, headache, and speech difficulties. He did not receive IV t-PA due to a delay in presentation. Symptoms also resolved today.  Probable  complicated migraine headaches versus less likely TIA.  Resultant - resolution of deficits.  MRI - No acute intracranial infarct or other abnormality identified.  MRA - Moderate short-segment stenosis within the mid right P2 segment.   Carotid Doppler - 1-39% internal carotid artery stenosis bilaterally. Vertebral arteries are patent with antegrade flow.  2D Echo - ejection fraction 60-65%. No obvious cardiac source of emboli identified.  LDL - 79  HgbA1c - 5.9  Lovenox for VTE prophylaxis   with thin liquids  aspirin 81 mg orally every day prior to admission, now on aspirin 325 mg orally every day  Ongoing aggressive risk factor management  Therapy recommendations:  Home health physical therapy and speech therapy recommended with 24-hour per day supervision.  Disposition: discharge.  Complicated migraine - not compliant with medication - was prescribed with imitrex but he took only once and not was treated with prevention - hx of complicated migraine and MRI neg -  will prescribe fioricet for abortive meds and topamax for HA prevention.  Hyperlipidemia  Home meds:  Omega-3 fatty acids.  LDL 79, goal < 70   Add low-dose Lipitor  Continue statin at discharge  Other Stroke Risk Factors Advanced age . Family hx stroke (brother had a stroke) Obstructive sleep apnea, on CPAP at home  Other Active Problems  Elevated glucose levels - hemoglobin A1c 5.9  Other Pertinent History  History of trigeminal neuralgia  History of vertigo  Hospital day # Bear PA-C [facility] Pager [phone] 10/29/2014, 11:40 PM  I, the attending vascular neurologist, have personally obtained a history, examined the patient, evaluated laboratory data, individually viewed imaging studies, and formulated the assessment and plan of care.  I have made any additions or clarifications directly to the above note and agree with the findings and plan as currently  documented.   Please note: All text in blue color in the note is my addition to the original note.   Rosalin Hawking, MD PhD Stroke Neurology 10/29/2014 11:51 PM     To contact Stroke Continuity provider, please refer to [email or web address] After hours, contact General Neurology

## 2014-10-28 NOTE — Progress Notes (Signed)
Pt placed on home CPAP. RT checked machine and cords and everything in good shape. Filled humidity chamber with water. Pt is resting comfortably.

## 2014-10-28 NOTE — Evaluation (Signed)
Physical Therapy Evaluation Patient Details Name: Quadir Muns MRN: [id number] DOB: 1941/02/24 Today's Date: 10/28/2014   History of Present Illness  Christopher Atkinson is a 73 y.o. male with a Past Medical History ofmigraine headaches, hypertension, known 5 cm thoracic aortic aneurysm, aortic insufficiency, obstructive sleep apnea on CPAP who presents today with the above noted complaint.please note, most of this history is obtained from the spouse at bedside. Apparently this morning, patient woke up with a headache and was just not feeling "good". He subsequently went to work, where his family and other coworker snoted that patient had expressive aphasia and did not seem his usual self.family apparently also noted some difficulty with gait, claimed that the patient walked into a car at work. He was subsequently brought to the hospital by family members, CT of the head was negative for acute abnormalities, the hospitalist service was consulted to admit this patient for further evaluation and treatment.  Clinical Impression  Pt admitted with/for s/s of stroke. MRI negative for acute event, but there are changes in mentation and gait disturbance.  Vertigo has also been a recent problem.   Pt currently limited functionally due to the problems listed below.  (see problems list.)  Pt will benefit from PT to maximize function and safety to be able to get home safely with available assist of family.     Follow Up Recommendations Home health PT;Supervision/Assistance - 24 hour    Equipment Recommendations  None recommended by PT (TBA)    Recommendations for Other Services       Precautions / Restrictions Precautions Precautions: Fall      Mobility  Bed Mobility Overal bed mobility: Needs Assistance Bed Mobility: Supine to Sit;Sit to Supine     Supine to sit: Min guard Sit to supine: Supervision   General bed mobility comments: steady assist  Transfers Overall transfer level: Needs assistance    Transfers: Sit to/from Stand Sit to Stand: Min guard            Ambulation/Gait Ambulation/Gait assistance: Min guard Ambulation Distance (Feet): 200 Feet Assistive device: None Gait Pattern/deviations: Step-through pattern;Festinating;Drifts right/left Gait velocity: moderate   General Gait Details: variable quality of gait from generally steady to a festinating unsteady gait where pt verbally has to be slowed down for stability  Stairs            Wheelchair Mobility    Modified Rankin (Stroke Patients Only)       Balance Overall balance assessment: Needs assistance Sitting-balance support: No upper extremity supported;Single extremity supported Sitting balance-Leahy Scale: Fair Sitting balance - Comments: patient lists slowly backward with challenge or loss of focus.   Standing balance support: No upper extremity supported Standing balance-Leahy Scale: Fair Standing balance comment: steps back or up against the bed to stabilize when balance challenged                             Pertinent Vitals/Pain Pain Assessment: No/denies pain    Home Living Family/patient expects to be discharged to:: Private residence Living Arrangements: Spouse/significant other Available Help at Discharge: Family;Available 24 hours/day (wife and daugther will take off work to help as needed) Type of Home: House Home Access: Stairs to enter Entrance Stairs-Rails: None Entrance Stairs-Number of Steps: 5 Home Layout: One level Home Equipment: [place] - single point;Other (comment) (access to RW) Additional Comments: Pt still goes to work    Prior Function Level of Independence: Independent  Hand Dominance        Extremity/Trunk Assessment   Upper Extremity Assessment: Defer to OT evaluation           Lower Extremity Assessment: Overall WFL for tasks assessed      Cervical / Trunk Assessment: Normal (but stoops)  Communication    Communication: No difficulties  Cognition Arousal/Alertness: Awake/alert Behavior During Therapy: Agitated;Restless Overall Cognitive Status: Impaired/Different from baseline Area of Impairment: Attention;Following commands;Safety/judgement;Awareness   Current Attention Level: Sustained Memory: Decreased short-term memory;Decreased recall of precautions Following Commands: Follows one step commands inconsistently Safety/Judgement: Decreased awareness of safety;Decreased awareness of deficits Awareness: Intellectual        General Comments      Exercises        Assessment/Plan    PT Assessment Patient needs continued PT services  PT Diagnosis Abnormality of gait;Altered mental status   PT Problem List Decreased activity tolerance;Decreased balance;Decreased mobility;Decreased cognition;Decreased safety awareness  PT Treatment Interventions Gait training;Stair training;Functional mobility training;Therapeutic activities;Balance training;Patient/family education (Vestibular assessment)   PT Goals (Current goals can be found in the Care Plan section) Acute Rehab PT Goals Patient Stated Goal: pt did not say, wife and daughter want some answers to his present problems. PT Goal Formulation: With patient/family Time For Goal Achievement: 11/11/14 Potential to Achieve Goals: Fair    Frequency Min 3X/week   Barriers to discharge        Co-evaluation               End of Session   Activity Tolerance: Patient tolerated treatment well Patient left: in bed;with call bell/phone within reach;with family/visitor present Nurse Communication: Mobility status         Time: 1047-1130 PT Time Calculation (min): 43 min   Charges:   PT Evaluation $Initial PT Evaluation Tier I: 1 Procedure PT Treatments $Gait Training: 8-22 mins $Therapeutic Activity: 8-22 mins   PT G Codes:          Crystian Frith, Tessie Fass 10/28/2014, 11:50 AM 10/28/2014  Donnella Sham,  PT [phone]  (pager)

## 2014-10-28 NOTE — Progress Notes (Signed)
*  PRELIMINARY RESULTS* Vascular Ultrasound Carotid Duplex (Doppler) has been completed. Findings suggest 1-39% internal carotid artery stenosis bilaterally. Vertebral arteries are patent with antegrade flow.  10/28/2014 1:12 PM Maudry Mayhew, RVT, RDCS, RDMS

## 2014-10-28 NOTE — Progress Notes (Signed)
  Echocardiogram 2D Echocardiogram has been performed.  Diamond Nickel 10/28/2014, 9:49 AM

## 2014-10-28 NOTE — Plan of Care (Signed)
Problem: Discharge/Transitional Outcomes Goal: Barriers To Progression Addressed/Resolved Outcome: Completed/Met Date Met:  10/28/14 Goal: Educational Plan Complete Outcome: Completed/Met Date Met:  10/28/14 Goal: Hemodynamically stable Outcome: Completed/Met Date Met:  10/28/14 Goal: Independent mobility/functioning independent or with min Independent mobility/functioning independently or with minimal assistance  Outcome: Completed/Met Date Met:  10/28/14 Goal: Tolerating diet/TF at goal rate-PEG if inadequate intake Outcome: Completed/Met Date Met:  10/28/14 Goal: INR monitor plan established Outcome: Completed/Met Date Met:  10/28/14 Goal: Family and patient agree upon discharge plan Outcome: Completed/Met Date Met:  10/28/14 Goal: Family/Caregiver willing and able to support plan Family/Caregiver willing and able to support plan for self-management after transition home  Outcome: Completed/Met Date Met:  10/28/14 Goal: PCP appointment made and transportation plan in place Outcome: Completed/Met Date Met:  10/28/14 Goal: Ability to attain medications upon leaving hospital Outcome: Completed/Met Date Met:  10/28/14 Goal: Other Discharge Outcomes/Goals Outcome: Completed/Met Date Met:  10/28/14

## 2014-10-28 NOTE — Progress Notes (Signed)
OT Cancellation Note  Patient Details Name: Christopher Atkinson MRN: [id number] DOB: Feb 16, 1941   Cancelled Treatment:    Reason Eval/Treat Not Completed: Patient at procedure or test/ unavailable (attempted x 2, first with PT, then having test in room). Will check back for eval later time.  Dontarious Schaum A 10/28/2014, 11:20 AM

## 2014-10-28 NOTE — Discharge Summary (Signed)
Physician Discharge Summary  Rakesh Dutko QHU:[id number] DOB: [birth year] DOA: 10/27/2014  PCP: [name], MD  Admit date: 10/27/2014 Discharge date: 10/28/2014  Time spent: 45 minutes  Recommendations for Outpatient Follow-up:  1. PCP in 1 week 2. Dr.Ahern with Neurology in 2-3weeks  Discharge Diagnoses:    Expressive Aphasia   Headache   Cognitive deficits   Thoracic aortic aneurysm   Aortic insufficiency   Obstructive sleep apnea   Discharge Condition: stable  Diet recommendation: low sodium  Filed Weights   10/27/14 1346  Weight: 83.915 kg (185 lb)    History of present illness:  Bellamy Judson is a 73 y.o. male with a Past Medical History ofmigraine headaches, hypertension, known 5 cm thoracic aortic aneurysm, aortic insufficiency, obstructive sleep apnea on CPAP who presented 11/5 with speech diffculty . Patient woke up 11/5 with a headache and was just not feeling "good". He subsequently went to work, where his family and other coworker snoted that patient had expressive aphasia and did not seem his usual self.family apparently also noted some difficulty with gait, claimed that the patient walked into a car at work. 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  - MRI brain negative for CVA, EEG negative - ECHO and carotid duplex unremarkable - Neurology following, started on Topamax and Fioricet and advised FU with Outpatient Neurology -Pt/OT eval completed, [place] services recommended  . Thoracic aortic aneurysm:Being followed in the outpatient by both cardiothoracic surgery and cardiology-apparently stable 5 cm aneurysm for the past 3 years, and defer  further management to the outpatient setting.  . Obstructive sleep apnea:Continue CPAP  . Aortic insufficiency:  - Chronic issue, mild AR on ECHO   Consultations:  Neurology  Discharge Exam: Filed Vitals:   10/28/14 1159  BP: 97/59  Pulse: 52  Temp: 97.5 F (36.4 C)  Resp: 18    General: alert, awake, oriented to self and place only Cardiovascular: S1S2/RRR Respiratory: CTAB  Discharge Instructions You were cared for by a hospitalist during your hospital stay. If you have any questions about your discharge medications or the care you received while you were in the hospital after you are discharged, you can call the unit and asked to speak with the hospitalist on call if the hospitalist that took care of you is not available. Once you are discharged, your primary care physician will handle any further medical issues. Please note that NO REFILLS for any discharge medications will be authorized once you are discharged, as it is imperative that you return to your primary care physician (or establish a relationship with a primary care physician if you do not have one) for your aftercare needs so that they can reassess your need for medications and monitor your lab values.   Current Discharge Medication List    CONTINUE these medications which have CHANGED   Details  aspirin 325 MG tablet Take 1 tablet (325 mg total) by mouth daily.      CONTINUE these medications which have NOT CHANGED   Details  Coenzyme Q10 (CO Q-10) 100 MG CAPS Take 100 mg by mouth daily.    diphenhydrAMINE (SOMINEX) 25 MG tablet Take 25 mg by mouth daily as needed for sleep.    gabapentin (  NEURONTIN) 100 MG capsule Take 200 mg by mouth 2 (two) times daily.    MAGNESIUM PO Take 1 tablet by mouth daily.    Multiple Vitamins-Minerals (CVS SPECTRAVITE ADULT 50+ PO) Take 1 tablet by mouth daily.    Nutritional Supplements (PROSTAMEN PO) Take 1 capsule by mouth daily.    Omega-3 Fatty Acids (OMEGA-3 PLUS  PO) Take 1 tablet by mouth daily.    Potassium 99 MG TABS Take 99 mg by mouth daily.    Saw Palmetto 450 MG CAPS Take 450 mg by mouth daily.       No Known Allergies Follow-up Information    Follow up with LITTLE,JAMES, MD. Schedule an appointment as soon as possible for a visit in 1 week.   Specialty:  Family Medicine   Contact information:   [address] [place] [address] [phone]       Follow up with Xu,Jindong, MD. Schedule an appointment as soon as possible for a visit in 1 month.   Specialty:  Neurology   Contact information:   [address] [place]  [id number] [phone]        The results of significant diagnostics from this hospitalization (including imaging, microbiology, ancillary and laboratory) are listed below for reference.    Significant Diagnostic Studies: Dg Chest 2 View  10/28/2014   CLINICAL DATA:  Weakness  EXAM: CHEST  2 VIEW  COMPARISON:  09/13/2014  FINDINGS: Cardiac shadow is within normal limits. The lungs are clear bilaterally. No focal infiltrate or sizable effusion is seen. No acute bony abnormality is noted.  IMPRESSION: No active cardiopulmonary disease.   Electronically Signed   By: Inez Catalina M.D.   On: 10/28/2014 07:36   Ct Head Wo Contrast  10/27/2014   CLINICAL DATA:  Cerebral vascular accident. Headache, confusion and unsteady gait. Altered mental status.  EXAM: CT HEAD WITHOUT CONTRAST  TECHNIQUE: Contiguous axial images were obtained from the base of the skull through the vertex without intravenous contrast.  COMPARISON:  08/02/2014  FINDINGS: No mass lesion. No midline shift. No acute hemorrhage or hematoma. No extra-axial fluid collections. No evidence of acute infarction. Dilated perivascular space at the base of the left basal ganglia, unchanged. Tiny old white matter infarct in the left posterior parietal region is new since the prior study.  There is slight diffuse atrophy.  No osseous abnormality.   IMPRESSION: No acute intracranial abnormality. 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   Electronically Signed   By: Rozetta Nunnery M.D.   On: 10/27/2014 14:04   Mr Brain Wo Contrast  10/27/2014   CLINICAL DATA:  Initial valuation for expressive a aphasia. Evaluate for stroke.  EXAM: MRI HEAD WITHOUT CONTRAST  MRA HEAD WITHOUT CONTRAST  TECHNIQUE: Multiplanar, multiecho pulse sequences of the brain and surrounding structures were obtained without intravenous contrast. Angiographic images of the head were obtained using MRA technique without contrast.  COMPARISON:  Prior CT from earlier the same day.  FINDINGS: MRI HEAD FINDINGS  Mild diffuse prominence of the CSF containing spaces is compatible with age-appropriate cerebral atrophy. Patchy T2/FLAIR hyperintensity within the periventricular and deep white matter both cerebral hemispheres is most compatible with chronic small vessel ischemic changes, mild to moderate for patient age.  No mass lesion, midline shift, or extra-axial fluid collection. Ventricles are normal in size without evidence of hydrocephalus. Few scattered prominent perivascular spaces noted within the periventricular and deep white matter.  No diffusion-weighted signal abnormality is identified  to suggest acute intracranial infarct. Tiny punctate hyperdensity seen at the periphery of the posterior left frontal lobe on axial DWI sequence (series 4, image 23) is favored to be artifactual in nature, and is not definitely seen on corresponding coronal sequence. Gray-white matter differentiation is maintained. Normal flow voids are seen within the intracranial vasculature. No intracranial hemorrhage identified.  The cervicomedullary junction is normal. Pituitary gland is within normal limits. Pituitary stalk is midline. The globes and optic nerves demonstrate a normal appearance with normal signal intensity.  The bone marrow signal intensity is normal. Calvarium is intact.  Visualized upper cervical spine is within normal limits.  Scalp soft tissues are unremarkable.  Paranasal sinuses are clear.  No mastoid effusion.  MRA HEAD FINDINGS  Anterior circulation: Normal flow related enhancement of the included cervical, petrous, cavernous and supra clinoid internal carotid arteries. Patent anterior communicating artery. Normal flow related enhancement of the anterior and middle cerebral arteries, including more distal segments.  Posterior circulation: Vertebral arteries are codominant. Basilar artery is patent, with normal flow related enhancement of the main branch vessels. There is a moderate short-segment stenosis within the right P2 segment (series 505, image 11). Posterior cerebral arteries are otherwise well opacified. Superior cerebral arteries within normal limits.  No large vessel occlusion, hemodynamically significant stenosis, abnormal luminal irregularity, aneurysm within the anterior nor posterior circulation.  IMPRESSION: MRI HEAD IMPRESSION:  1. No acute intracranial infarct or other abnormality identified. 2. Age-related atrophy with mild to moderate chronic microvascular ischemic disease.  MRA HEAD IMPRESSION:  1. Moderate short-segment stenosis within the mid right P2 segment. Right PCA is well opacified distally. 2. Otherwise unremarkable MRA of the intracranial circulation with no proximal branch occlusion or hemodynamically significant stenosis identified.   Electronically Signed   By: Jeannine Boga M.D.   On: 10/27/2014 22:51   [name] Head/brain Wo Cm  10/27/2014   CLINICAL DATA:  Initial valuation for expressive a aphasia. Evaluate for stroke.  EXAM: MRI HEAD WITHOUT CONTRAST  MRA HEAD WITHOUT CONTRAST  TECHNIQUE: Multiplanar, multiecho pulse sequences of the brain and surrounding structures were obtained without intravenous contrast. Angiographic images of the head were obtained using MRA technique without contrast.  COMPARISON:  Prior CT from earlier the  same day.  FINDINGS: MRI HEAD FINDINGS  Mild diffuse prominence of the CSF containing spaces is compatible with age-appropriate cerebral atrophy. Patchy T2/FLAIR hyperintensity within the periventricular and deep white matter both cerebral hemispheres is most compatible with chronic small vessel ischemic changes, mild to moderate for patient age.  No mass lesion, midline shift, or extra-axial fluid collection. Ventricles are normal in size without evidence of hydrocephalus. Few scattered prominent perivascular spaces noted within the periventricular and deep white matter.  No diffusion-weighted signal abnormality is identified to suggest acute intracranial infarct. Tiny punctate hyperdensity seen at the periphery of the posterior left frontal lobe on axial DWI sequence (series 4, image 23) is favored to be artifactual in nature, and is not definitely seen on corresponding coronal sequence. Gray-white matter differentiation is maintained. Normal flow voids are seen within the intracranial vasculature. No intracranial hemorrhage identified.  The cervicomedullary junction is normal. Pituitary gland is within normal limits. Pituitary stalk is midline. The globes and optic nerves demonstrate a normal appearance with normal signal intensity.  The bone marrow signal intensity is normal. Calvarium is intact. Visualized upper cervical spine is within normal limits.  Scalp soft tissues are unremarkable.  Paranasal sinuses are clear.  No mastoid effusion.  MRA  HEAD FINDINGS  Anterior circulation: Normal flow related enhancement of the included cervical, petrous, cavernous and supra clinoid internal carotid arteries. Patent anterior communicating artery. Normal flow related enhancement of the anterior and middle cerebral arteries, including more distal segments.  Posterior circulation: Vertebral arteries are codominant. Basilar artery is patent, with normal flow related enhancement of the main branch vessels. There is a  moderate short-segment stenosis within the right P2 segment (series 505, image 11). Posterior cerebral arteries are otherwise well opacified. Superior cerebral arteries within normal limits.  No large vessel occlusion, hemodynamically significant stenosis, abnormal luminal irregularity, aneurysm within the anterior nor posterior circulation.  IMPRESSION: MRI HEAD IMPRESSION:  1. No acute intracranial infarct or other abnormality identified. 2. Age-related atrophy with mild to moderate chronic microvascular ischemic disease.  MRA HEAD IMPRESSION:  1. Moderate short-segment stenosis within the mid right P2 segment. Right PCA is well opacified distally. 2. Otherwise unremarkable MRA of the intracranial circulation with no proximal branch occlusion or hemodynamically significant stenosis identified.   Electronically Signed   By: Jeannine Boga M.D.   On: 10/27/2014 22:51    Microbiology: No results found for this or any previous visit (from the past 240 hour(s)).   Labs: Basic Metabolic Panel:  Recent Labs Lab 10/27/14 1403 10/27/14 1416  NA 142 141  K 4.1 3.8  CL 103 102  CO2 25  --   GLUCOSE 136* 140*  BUN 18 18  CREATININE 0.64 0.70  CALCIUM 9.3  --    Liver Function Tests:  Recent Labs Lab 10/27/14 1403  AST 32  ALT 27  ALKPHOS 75  BILITOT 1.2  PROT 7.3  ALBUMIN 3.9   No results for input(s): LIPASE, AMYLASE in the last 168 hours. No results for input(s): AMMONIA in the last 168 hours. CBC:  Recent Labs Lab 10/27/14 1403 10/27/14 1416  WBC 9.1  --   NEUTROABS 7.8*  --   HGB 15.1 16.0  HCT 43.3 47.0  MCV 86.8  --   PLT 199  --    Cardiac Enzymes: No results for input(s): CKTOTAL, CKMB, CKMBINDEX, TROPONINI in the last 168 hours. BNP: BNP (last 3 results) No results for input(s): PROBNP in the last 8760 hours. CBG:  Recent Labs Lab 10/27/14 1345  GLUCAP 124*       Signed:  Thula Stewart  [facility] 10/28/2014, 2:42 PM

## 2014-10-29 ENCOUNTER — Other Ambulatory Visit: Payer: Self-pay | Admitting: Neurology

## 2014-10-29 DIAGNOSIS — Z8669 Personal history of other diseases of the nervous system and sense organs: Secondary | ICD-10-CM | POA: Insufficient documentation

## 2014-10-29 DIAGNOSIS — I639 Cerebral infarction, unspecified: Secondary | ICD-10-CM

## 2014-10-29 DIAGNOSIS — R4182 Altered mental status, unspecified: Secondary | ICD-10-CM | POA: Insufficient documentation

## 2014-11-01 NOTE — Progress Notes (Addendum)
Late Entry Addendum for Initial Evaluation and Discharge    10/28/14 1134  PT Time Calculation  PT Start Time (ACUTE ONLY) 1047  PT Stop Time (ACUTE ONLY) 1130  PT Time Calculation (min) (ACUTE ONLY) 43 min  PT G-Codes **NOT FOR INPATIENT CLASS**  Functional Assessment Tool Used clinical judgement  Functional Limitation Mobility: Walking and moving around  Mobility: Walking and Moving Around Current Status (K4818) CI  Mobility: Walking and Moving Around Goal Status (H6314) CH  Mobility: Walking and Moving Around Discharge Status (H7026) CI  PT General Charges  $$ ACUTE PT VISIT 1 Procedure  PT Evaluation  $Initial PT Evaluation Tier I 1 Procedure  PT Treatments  $Gait Training 8-22 mins  $Therapeutic Activity 8-22 mins   11/01/2014  Donnella Sham, PT [phone]  (pager)

## 2014-11-02 ENCOUNTER — Ambulatory Visit (INDEPENDENT_AMBULATORY_CARE_PROVIDER_SITE_OTHER): Payer: Medicare HMO | Admitting: Neurology

## 2014-11-02 ENCOUNTER — Encounter: Payer: Self-pay | Admitting: Neurology

## 2014-11-02 VITALS — BP 119/70 | HR 54 | Ht 69.0 in | Wt 182.0 lb

## 2014-11-02 DIAGNOSIS — R5382 Chronic fatigue, unspecified: Secondary | ICD-10-CM

## 2014-11-02 DIAGNOSIS — F919 Conduct disorder, unspecified: Secondary | ICD-10-CM

## 2014-11-02 DIAGNOSIS — G43109 Migraine with aura, not intractable, without status migrainosus: Secondary | ICD-10-CM

## 2014-11-02 DIAGNOSIS — R4689 Other symptoms and signs involving appearance and behavior: Secondary | ICD-10-CM

## 2014-11-02 DIAGNOSIS — R4189 Other symptoms and signs involving cognitive functions and awareness: Secondary | ICD-10-CM

## 2014-11-02 DIAGNOSIS — G43909 Migraine, unspecified, not intractable, without status migrainosus: Secondary | ICD-10-CM

## 2014-11-02 DIAGNOSIS — F0390 Unspecified dementia without behavioral disturbance: Secondary | ICD-10-CM

## 2014-11-02 DIAGNOSIS — E538 Deficiency of other specified B group vitamins: Secondary | ICD-10-CM

## 2014-11-02 MED ORDER — BUTALBITAL-APAP-CAFFEINE 50-325-40 MG PO TABS: 1.0000 | ORAL_TABLET | Freq: Four times a day (QID) | ORAL | Status: DC | PRN

## 2014-11-02 MED ORDER — TOPIRAMATE 50 MG PO TABS: 50.0000 mg | ORAL_TABLET | Freq: Two times a day (BID) | ORAL | Status: DC

## 2014-11-02 MED ORDER — DONEPEZIL HCL 5 MG PO TABS: 5.0000 mg | ORAL_TABLET | Freq: Every day | ORAL | Status: DC
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
## 2014-11-02 NOTE — Patient Instructions (Addendum)
Overall you are doing fairly well but I do want to suggest a few things today:   Remember to drink plenty of fluid, eat healthy meals and do not skip any meals. Try to eat protein with a every meal and eat a healthy snack such as fruit or nuts in between meals. Try to keep a regular sleep-wake schedule and try to exercise daily, particularly in the form of walking, 20-30 minutes a day, if you can.   As far as your medications are concerned, I would like to suggest: Continue the Topamax. Will start Aricept. Common side effects include nausea, diarrhea, GI upset. Also fioricet sparingly at start of severe headache.  As far as diagnostic testing: Labs today  I would like to see you back in 3 months, sooner if we need to. Please call us with any interim questions, concerns, problems, updates or refill requests.   Please also call us for any test results so we can go over those with you on the phone.  My clinical assistant and will answer any of your questions and relay your messages to me and also relay most of my messages to you.   Our phone number is [phone]. We also have an after hours call service for urgent matters and there is a physician on-call for urgent questions. For any emergencies you know to call 911 or go to the nearest emergency room

## 2014-11-03 LAB — B12 AND FOLATE PANEL: Vitamin B-12: 1103 pg/mL — ABNORMAL HIGH (ref 211–946)

## 2014-11-03 LAB — TSH: TSH: 1.53 u[IU]/mL (ref 0.450–4.500)

## 2014-11-05 ENCOUNTER — Encounter: Payer: Self-pay | Admitting: Neurology

## 2014-11-05 DIAGNOSIS — R4189 Other symptoms and signs involving cognitive functions and awareness: Secondary | ICD-10-CM | POA: Insufficient documentation

## 2014-11-05 DIAGNOSIS — G43109 Migraine with aura, not intractable, without status migrainosus: Secondary | ICD-10-CM | POA: Insufficient documentation

## 2014-11-05 DIAGNOSIS — R4689 Other symptoms and signs involving appearance and behavior: Secondary | ICD-10-CM

## 2014-11-07 ENCOUNTER — Other Ambulatory Visit: Payer: Self-pay | Admitting: Neurology

## 2014-11-07 ENCOUNTER — Telehealth: Payer: Self-pay | Admitting: Neurology

## 2014-11-07 MED ORDER — DIVALPROEX SODIUM ER 250 MG PO TB24: 250.0000 mg | ORAL_TABLET | Freq: Every evening | ORAL | Status: DC

## 2014-11-07 NOTE — Telephone Encounter (Signed)
Discussed labs, normal B12 and TSH. Patient is on topamax and headaches are much better, has not had any since appointment. The Topamax seems to be working however may be making his cognitive issues worse. He was given the Topamax when discharged from the hospital. Discussed with wife and daughter that Topamax can be causing the worsening cognition and they would like to change medication. Don't want to start a calcium-channel blocker or b-blocker because his pulse and BP are sometimes low (in the office his pulse was 54). No great options when treating migraines in the elderly. Will try very low dose Depakote. Instructed wife how to slowly titrate the Topamax off. Also discussed to stop the Depakote if any significant side effects occur on Depakote such as rash, altered mental status, significant tremor or dizziness or any concerning symptom and to call me.   Georgia Dom, MD

## 2014-11-09 ENCOUNTER — Ambulatory Visit: Payer: Medicare HMO | Admitting: Neurology

## 2014-11-16 ENCOUNTER — Encounter: Payer: Self-pay | Admitting: Neurology

## 2014-11-16 ENCOUNTER — Ambulatory Visit (INDEPENDENT_AMBULATORY_CARE_PROVIDER_SITE_OTHER): Payer: Self-pay | Admitting: Neurology

## 2014-11-16 ENCOUNTER — Ambulatory Visit (INDEPENDENT_AMBULATORY_CARE_PROVIDER_SITE_OTHER): Payer: Medicare HMO | Admitting: Neurology

## 2014-11-16 VITALS — BP 150/82 | HR 65 | Ht 69.0 in | Wt 183.0 lb

## 2014-11-16 DIAGNOSIS — G441 Vascular headache, not elsewhere classified: Secondary | ICD-10-CM

## 2014-11-16 DIAGNOSIS — G43909 Migraine, unspecified, not intractable, without status migrainosus: Secondary | ICD-10-CM

## 2014-11-16 DIAGNOSIS — G43109 Migraine with aura, not intractable, without status migrainosus: Secondary | ICD-10-CM

## 2014-11-16 DIAGNOSIS — F919 Conduct disorder, unspecified: Secondary | ICD-10-CM

## 2014-11-16 DIAGNOSIS — R4189 Other symptoms and signs involving cognitive functions and awareness: Secondary | ICD-10-CM

## 2014-11-16 DIAGNOSIS — R451 Restlessness and agitation: Secondary | ICD-10-CM

## 2014-11-16 DIAGNOSIS — R4689 Other symptoms and signs involving appearance and behavior: Principal | ICD-10-CM

## 2014-11-16 DIAGNOSIS — Z0289 Encounter for other administrative examinations: Secondary | ICD-10-CM

## 2014-11-16 MED ORDER — DIVALPROEX SODIUM 500 MG PO DR TAB: 500.0000 mg | DELAYED_RELEASE_TABLET | Freq: Every day | ORAL | Status: DC

## 2014-11-16 NOTE — Patient Instructions (Signed)
Remember to drink plenty of fluid, eat healthy meals and do not skip any meals. Try to eat protein with a every meal and eat a healthy snack such as fruit or nuts in between meals. Try to keep a regular sleep-wake schedule and try to exercise daily, particularly in the form of walking, 20-30 minutes a day, if you can.   As far as your medications are concerned, I would like to suggest: Increase Depakote to 500mg  once daily. Continue current dose of Aricept.  I would like to see you back in 3 months, sooner if we need to. Please call us with any interim questions, concerns, problems, updates or refill requests.   Please also call us for any test results so we can go over those with you on the phone.  My clinical assistant and will answer any of your questions and relay your messages to me and also relay most of my messages to you.   Our phone number is [phone]. We also have an after hours call service for urgent matters and there is a physician on-call for urgent questions. For any emergencies you know to call 911 or go to the nearest emergency room
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
## 2014-11-18 DIAGNOSIS — R451 Restlessness and agitation: Secondary | ICD-10-CM | POA: Insufficient documentation

## 2014-11-18 NOTE — Patient Instructions (Signed)
  [facility]    Provider:  Dr Jaynee Eagles Referring Provider: [name], MD Primary Care Physician:  [name], MD  CC:  Left-sided numbness and tingling  HPI:  Christopher Atkinson is an 73 y.o. male with a history of thoracic aortic aneurysm, aortic insufficiency and obstructive sleep apnea. Patient was recently admitted to [facility] for, per notes, acute onset of speech output difficulty and confusion as well as complaint of severe headache. He was taking ASA 81mg . NIH stroke score was 5, including moderately severe expressive aphasia with minimal receptive aphasia. Family members gave a history of similar episodes in the past with speech output difficulty associated with complaint of severe headache, the last of which occurred in September 2015. He had an MRI study at that time which was unremarkable.CT scan in the ED did not show any acute abnormalities ane he was admitted for workup. Dxed with complicated migraines. TPA was not administered. He describes left-sided numbness and tingling. Poor historian.   Summary  Nerve conduction studies were performed on the left upper and left lower extremities:  The left Median motor nerve showed normal conductions with normal F Wave latency The left Ulnar motor nerve showed normal conductions with normal F Wave latency The left Peroneal motor nerve showed normal conductions with normal F Wave latency The left Tibial motor nerve showed normal conductions with normal F Wave latency The left second-digit Median sensory nerve conduction was within normal limits The left fifth-digit Ulnar sensory nerve conduction was within normal limits The left Radial sensory nerve conduction was within normal limits The left Sural sensory nerve conduction was within normal limits Bilateral H Reflexes showed normal latencies The left Median/Ulnar (palm) comparison nerve showed normal peak latency difference  EMG Needle study was performed on  selected left upper and left lower extremity muscles:   The Deltoid, Triceps, Pronator Teres, Opponens Pollicis, First Dorsal interosseous, Vastus Medialis, Anterior Tibialis, Medial Gastrocnemius, Extensor Hallucis Longus and Abductor Hallucis muscles were within normal limits.  Conclusion: This is a normal study. No electrophysiologic evidence for ulnar or median neuropathy, peripheral polyneuropathy or radiculopathy.. There is no evidence of polyneuropathy however a small fiber neuropathy could be responsible for such symptoms and still evade detection by this study. Clinical correlation recommended.     Assessment/Plan:    Sarina Ill, MD  [facility] [address] [place], [place] [address]  Phone [phone] Fax [phone]

## 2014-12-27 ENCOUNTER — Ambulatory Visit (INDEPENDENT_AMBULATORY_CARE_PROVIDER_SITE_OTHER): Payer: Medicare HMO | Admitting: Cardiovascular Disease

## 2014-12-27 ENCOUNTER — Encounter: Payer: Self-pay | Admitting: Cardiovascular Disease

## 2014-12-27 VITALS — BP 120/72 | HR 60 | Ht 70.0 in | Wt 187.3 lb

## 2014-12-27 DIAGNOSIS — I351 Nonrheumatic aortic (valve) insufficiency: Secondary | ICD-10-CM

## 2014-12-27 DIAGNOSIS — I712 Thoracic aortic aneurysm, without rupture, unspecified: Secondary | ICD-10-CM

## 2014-12-27 NOTE — Patient Instructions (Signed)
Non-Cardiac CT Angiography (CTA), is a special type of CT scan that uses a computer to produce multi-dimensional views of major blood vessels throughout the body. In CT angiography, a contrast material is injected through an IV to help visualize the blood vessels  Your physician wants you to follow-up in 1 year with Dr. Gwenlyn Found. You will receive a reminder letter in the mail 2 months in advance. If you do not receive a letter, please call our office to schedule the follow-up appointment.

## 2014-12-27 NOTE — Assessment & Plan Note (Signed)
History of thoracic aortic aneurysm last assessed November 2014 at 5 cm. I'm going to obtain a CT angiogram of his chest to continue to follow this.

## 2014-12-27 NOTE — Progress Notes (Signed)
12/27/2014 Christopher Atkinson Alert   08/14/1941  [id number]  Primary Physician [name], MD Primary Cardiologist: Lorretta Harp MD Renae Gloss   HPI:  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 His cardiac risk factor profile is essentially benign. He was followed for thoracic aortic aneurysm by CT angiography annually, aortic insufficiency by 2-D echocardiography. He also has obstructive sleep apnea on CPAP. He denies chest pain or shortness of breath. His most recent lipid profile performed 10/28/14 revealed a total cholesterol of 128, LDL 79 and HDL of 35. His last CT angiogram performed in November 2014 revealed his thoracic aortic aneurysm measured 5 cm.   Current Outpatient Prescriptions  Medication Sig Dispense Refill  . aspirin 81 MG tablet Take 81 mg by mouth daily.    . butalbital-acetaminophen-caffeine (FIORICET) 50-325-40 MG per tablet Take 1-2 tablets by mouth every 6 (six) hours as needed for headache. 10 tablet 3  . Coenzyme Q10 (CO Q-10) 100 MG CAPS Take 100 mg by mouth daily.    . divalproex (DEPAKOTE) 500 MG DR tablet Take 1 tablet (500 mg total) by mouth daily. 30 tablet 2  . donepezil (ARICEPT) 5 MG tablet Take 1 tablet (5 mg total) by mouth at bedtime. 30 tablet 5  . MAGNESIUM PO Take 1 tablet by mouth daily.    . Multiple Vitamins-Minerals (CVS SPECTRAVITE ADULT 50+ PO) Take 1 tablet by mouth daily.    . Nutritional Supplements (PROSTAMEN PO) Take 1 capsule by mouth daily.    . Potassium 99 MG TABS Take 99 mg by mouth daily.     No current facility-administered medications for this visit.    No Known Allergies  History   Social History  . Marital Status: Married    Spouse Name: Benjamine Atkinson     Number of Children: 2  . Years of Education: 12   Occupational History  . Not on file.   Social History  Main Topics  . Smoking status: Former Research scientist (life sciences)  . Smokeless tobacco: Never Used     Comment: quit smoking when he was 74 years old  . Alcohol Use: No  . Drug Use: No  . Sexual Activity: Not on file   Other Topics Concern  . Not on file   Social History Narrative   Patient lives at home with wife Benjamine Atkinson    Patient as 2 children.    Patient is right handed.    Patient has a high school education.         Review of Systems: General: negative for chills, fever, night sweats or weight changes.  Cardiovascular: negative for chest pain, dyspnea on exertion, edema, orthopnea, palpitations, paroxysmal nocturnal dyspnea or shortness of breath Dermatological: negative for rash Respiratory: negative for cough or wheezing Urologic: negative for hematuria Abdominal: negative for nausea, vomiting, diarrhea, bright red blood per rectum, melena, or hematemesis Neurologic: negative for visual changes, syncope, or dizziness All other systems reviewed and are otherwise negative except as noted above.    Blood pressure 120/72, pulse 60, height 5\' 10"  (1.778 m), weight 187 lb 4.8 oz (84.959 kg).  General appearance: alert and no distress Neck: no adenopathy, no carotid bruit, no JVD, supple, symmetrical, trachea midline and thyroid not enlarged, symmetric, no tenderness/mass/nodules Lungs: clear to auscultation bilaterally Heart: soft diastolic murmur at the left sternal border consistent with aortic insufficiency  Extremities: extremities normal, atraumatic, no cyanosis or edema  EKG normal sinus rhythm at 60 with inferior Q waves and anterior biphasic T waves unchanged from prior EKGs  ASSESSMENT AND PLAN:   Thoracic aortic aneurysm History of thoracic aortic aneurysm last assessed November 2014 at 5 cm. I'm going to obtain a CT angiogram of his chest to continue to follow this.  Aortic insufficiency History of mild aortic insufficiency recently checked by 2-D echo and was  unchanged.      Lorretta Harp MD FACP,FACC,FAHA, [facility] 12/27/2014 3:11 PM

## 2014-12-27 NOTE — Assessment & Plan Note (Signed)
History of mild aortic insufficiency recently checked by 2-D echo and was unchanged.

## 2014-12-28 ENCOUNTER — Telehealth: Payer: Self-pay | Admitting: *Deleted

## 2014-12-28 DIAGNOSIS — Z79899 Other long term (current) drug therapy: Secondary | ICD-10-CM

## 2014-12-28 NOTE — Telephone Encounter (Signed)
Patient will arrive to CT scan early to have STAT BUN and SCr.

## 2014-12-28 NOTE — Telephone Encounter (Signed)
-----   Message from Corinna Lines sent at 12/28/2014  9:59 AM EST ----- Regarding: labs for CT on 12-29-14   [place]  CT called me and said pt needs a BUN and creatinine before his CT.  Can you put the order in and let them know this.  Thanks [facility]

## 2014-12-29 ENCOUNTER — Telehealth: Payer: Self-pay | Admitting: Cardiovascular Disease

## 2014-12-29 ENCOUNTER — Ambulatory Visit (INDEPENDENT_AMBULATORY_CARE_PROVIDER_SITE_OTHER)
Admission: RE | Admit: 2014-12-29 | Discharge: 2014-12-29 | Disposition: A | Discharge status: Home / Self Care | Payer: Medicare HMO | Source: Ambulatory Visit | Attending: Cardiovascular Disease | Admitting: Cardiovascular Disease

## 2014-12-29 ENCOUNTER — Other Ambulatory Visit (INDEPENDENT_AMBULATORY_CARE_PROVIDER_SITE_OTHER): Payer: Medicare HMO

## 2014-12-29 DIAGNOSIS — I351 Nonrheumatic aortic (valve) insufficiency: Secondary | ICD-10-CM

## 2014-12-29 DIAGNOSIS — I712 Thoracic aortic aneurysm, without rupture, unspecified: Secondary | ICD-10-CM

## 2014-12-29 LAB — CREATININE, SERUM: Creatinine, Ser: 0.7 mg/dL (ref 0.4–1.5)

## 2014-12-29 LAB — BUN: BUN: 21 mg/dL (ref 6–23)

## 2014-12-29 MED ORDER — IOHEXOL 350 MG/ML SOLN
100.0000 mL | Freq: Once | INTRAVENOUS | Status: AC | PRN
  Administered 2014-12-29: 100 mL via INTRAVENOUS

## 2014-12-29 NOTE — Telephone Encounter (Signed)
Lab orders placed.  

## 2015-01-02 ENCOUNTER — Telehealth: Payer: Self-pay | Admitting: Cardiovascular Disease

## 2015-01-02 DIAGNOSIS — I712 Thoracic aortic aneurysm, without rupture, unspecified: Secondary | ICD-10-CM

## 2015-01-02 NOTE — Telephone Encounter (Signed)
Spoke to Pt's wife. Discussed recent CT Angio results. Advised will repeat in 12 months. She voiced understanding. Order placed to rpt CT Angio in 12 months.

## 2015-01-02 NOTE — Telephone Encounter (Signed)
Returning your call from Friday. °

## 2015-02-14 ENCOUNTER — Ambulatory Visit (INDEPENDENT_AMBULATORY_CARE_PROVIDER_SITE_OTHER): Payer: Medicare HMO | Admitting: Neurology

## 2015-02-14 VITALS — BP 118/78 | HR 54 | Ht 70.0 in | Wt 183.0 lb

## 2015-02-14 DIAGNOSIS — R4189 Other symptoms and signs involving cognitive functions and awareness: Secondary | ICD-10-CM

## 2015-02-14 DIAGNOSIS — G43909 Migraine, unspecified, not intractable, without status migrainosus: Secondary | ICD-10-CM

## 2015-02-14 DIAGNOSIS — G43109 Migraine with aura, not intractable, without status migrainosus: Secondary | ICD-10-CM

## 2015-02-14 DIAGNOSIS — F919 Conduct disorder, unspecified: Secondary | ICD-10-CM

## 2015-02-14 DIAGNOSIS — R4689 Other symptoms and signs involving appearance and behavior: Secondary | ICD-10-CM

## 2015-02-14 MED ORDER — BUTALBITAL-APAP-CAFFEINE 50-325-40 MG PO TABS: 1.0000 | ORAL_TABLET | Freq: Four times a day (QID) | ORAL | Status: AC | PRN

## 2015-02-14 MED ORDER — DONEPEZIL HCL 10 MG PO TABS: 10.0000 mg | ORAL_TABLET | Freq: Every day | ORAL | Status: DC

## 2015-02-14 MED ORDER — DIVALPROEX SODIUM 500 MG PO DR TAB: 500.0000 mg | DELAYED_RELEASE_TABLET | Freq: Every day | ORAL | Status: DC

## 2015-02-14 NOTE — Patient Instructions (Signed)
Overall you are doing fairly well but I do want to suggest a few things today:   Remember to drink plenty of fluid, eat healthy meals and do not skip any meals. Try to eat protein with a every meal and eat a healthy snack such as fruit or nuts in between meals. Try to keep a regular sleep-wake schedule and try to exercise daily, particularly in the form of walking, 20-30 minutes a day, if you can.   As far as your medications are concerned, I would like to suggest: continue current medications  I would like to see you back in 6 months, sooner if we need to. Please call us with any interim questions, concerns, problems, updates or refill requests.   Please also call us for any test results so we can go over those with you on the phone.  My clinical assistant and will answer any of your questions and relay your messages to me and also relay most of my messages to you.   Our phone number is [phone]. We also have an after hours call service for urgent matters and there is a physician on-call for urgent questions. For any emergencies you know to call 911 or go to the nearest emergency room   
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
## 2015-02-15 ENCOUNTER — Encounter: Payer: Self-pay | Admitting: Neurology

## 2015-02-21 ENCOUNTER — Telehealth: Payer: Self-pay | Admitting: Neurology

## 2015-02-21 NOTE — Telephone Encounter (Signed)
Pt is calling to states that she needs to have a prior auth on butalbital-acetaminophen-caffeine (FIORICET) 50-325-40 MG per tablet before the pharmacy will fill it.  Please call and adivse

## 2015-02-21 NOTE — Telephone Encounter (Signed)
All requested clinical info has been provided to ins.  Request is currently under review.  I called back. Got no answer.

## 2015-02-23 ENCOUNTER — Telehealth: Payer: Self-pay

## 2015-02-23 NOTE — Telephone Encounter (Signed)
Holland Falling has approved the request for coverage on Fioricet effective until 12/23/2015 Ref Member # [id number] Ref Key: [id number]

## 2015-06-05 ENCOUNTER — Telehealth: Payer: Self-pay | Admitting: Neurology

## 2015-06-05 NOTE — Telephone Encounter (Signed)
Spoke with daughter, "Barnett Applebaum" Rollene Fare) and told her Dr Jaynee Eagles doubts the Aricept is causing pt symptoms but she can have him stop medication to see if there are any changes. Told her Dr. Jaynee Eagles recommends he makes appt with PCP to evaluate for other causes of somnolence (sleepiness/drowsiness). She said she will let his wife know and get that appt scheduled. Told her to call if she had any more concerns. She verbalized understanding.

## 2015-06-05 NOTE — Telephone Encounter (Signed)
Thanks

## 2015-06-05 NOTE — Telephone Encounter (Signed)
Spoke with Rollene Fare, patient's daughter who is listed on DPR form and she is stating her father is very drowsy and tired, wanting to sleep all the time. She stated last time they were in to see Dr. Jaynee Eagles, she increased pt Aricept from 5mg  to 10mg  and she is wondering if this is the cause. If so, she wants to know if they can decrease back down to 5mg . She stated he has taken "abort" medication one time since last in the office for a headache, but otherwise much improved and does not have headaches before bed and is headache free most of the time. I told her I would let Dr. Jaynee Eagles know and we would call her back and to call us if she had any more questions. Regina verbalized understanding.

## 2015-06-05 NOTE — Telephone Encounter (Signed)
I doubt the Aricept is causing these symptoms. She can stop it and see if anything changes. But I do recommend going to his primary care as soon as posisble for evaluation of other causes of somnolence. Please let her know thanks.

## 2015-06-05 NOTE — Telephone Encounter (Signed)
Patients daughter called and requested to speak with Dr. Jaynee Eagles concerning her father. She has some questions about his medication Rx. donepezil (ARICEPT) 10 MG tablet and Rx. divalproex (DEPAKOTE) 500 MG DR tablet. She is worried about side effects of the medication. Please call and advise.

## 2015-07-26 ENCOUNTER — Emergency Department (HOSPITAL_COMMUNITY)
Admission: EM | Admit: 2015-07-26 | Discharge: 2015-07-26 | Disposition: A | Discharge status: Home / Self Care | Payer: Medicare HMO | Attending: Emergency Medicine | Admitting: Emergency Medicine

## 2015-07-26 ENCOUNTER — Emergency Department (HOSPITAL_COMMUNITY): Payer: Medicare HMO

## 2015-07-26 ENCOUNTER — Encounter (HOSPITAL_COMMUNITY): Payer: Self-pay | Admitting: *Deleted

## 2015-07-26 DIAGNOSIS — Z8669 Personal history of other diseases of the nervous system and sense organs: Secondary | ICD-10-CM

## 2015-07-26 DIAGNOSIS — Z79899 Other long term (current) drug therapy: Secondary | ICD-10-CM | POA: Insufficient documentation

## 2015-07-26 DIAGNOSIS — Z9981 Dependence on supplemental oxygen: Secondary | ICD-10-CM | POA: Diagnosis not present

## 2015-07-26 DIAGNOSIS — G43909 Migraine, unspecified, not intractable, without status migrainosus: Secondary | ICD-10-CM | POA: Diagnosis not present

## 2015-07-26 DIAGNOSIS — Z8679 Personal history of other diseases of the circulatory system: Secondary | ICD-10-CM | POA: Diagnosis not present

## 2015-07-26 DIAGNOSIS — G4733 Obstructive sleep apnea (adult) (pediatric): Secondary | ICD-10-CM | POA: Insufficient documentation

## 2015-07-26 DIAGNOSIS — R05 Cough: Secondary | ICD-10-CM | POA: Diagnosis not present

## 2015-07-26 DIAGNOSIS — R51 Headache: Secondary | ICD-10-CM | POA: Diagnosis present

## 2015-07-26 DIAGNOSIS — R404 Transient alteration of awareness: Secondary | ICD-10-CM | POA: Insufficient documentation

## 2015-07-26 DIAGNOSIS — Z87891 Personal history of nicotine dependence: Secondary | ICD-10-CM | POA: Diagnosis not present

## 2015-07-26 DIAGNOSIS — Z7982 Long term (current) use of aspirin: Secondary | ICD-10-CM | POA: Insufficient documentation

## 2015-07-26 LAB — COMPREHENSIVE METABOLIC PANEL
ALK PHOS: 60 U/L (ref 38–126)
ALT: 27 U/L (ref 17–63)
ANION GAP: 6 (ref 5–15)
AST: 29 U/L (ref 15–41)
Albumin: 3.5 g/dL (ref 3.5–5.0)
BUN: 18 mg/dL (ref 6–20)
CO2: 26 mmol/L (ref 22–32)
Calcium: 9 mg/dL (ref 8.9–10.3)
Chloride: 110 mmol/L (ref 101–111)
Creatinine, Ser: 0.77 mg/dL (ref 0.61–1.24)
GFR calc Af Amer: >60 mL/min (ref >60)
Glucose, Bld: 107 mg/dL — ABNORMAL HIGH (ref 65–99)
Potassium: 4.3 mmol/L (ref 3.5–5.1)
Sodium: 142 mmol/L (ref 135–145)
Total Bilirubin: 0.6 mg/dL (ref 0.3–1.2)
Total Protein: 6.5 g/dL (ref 6.5–8.1)

## 2015-07-26 LAB — CBC WITH DIFFERENTIAL/PLATELET
Basophils Absolute: 0 10*3/uL (ref 0.0–0.1)
Basophils Relative: 0 % (ref 0–1)
Eosinophils Absolute: 0.2 10*3/uL (ref 0.0–0.7)
Eosinophils Relative: 3 % (ref 0–5)
HEMATOCRIT: 42 % (ref 39.0–52.0)
Hemoglobin: 14.4 g/dL (ref 13.0–17.0)
LYMPHS ABS: 1.8 10*3/uL (ref 0.7–4.0)
Lymphocytes Relative: 31 % (ref 12–46)
MCH: 31.2 pg (ref 26.0–34.0)
MCHC: 34.3 g/dL (ref 30.0–36.0)
MCV: 90.9 fL (ref 78.0–100.0)
Monocytes Absolute: 0.7 10*3/uL (ref 0.1–1.0)
Monocytes Relative: 11 % (ref 3–12)
Neutro Abs: 3.2 10*3/uL (ref 1.7–7.7)
Neutrophils Relative %: 55 % (ref 43–77)
Platelets: 191 10*3/uL (ref 150–400)
RBC: 4.62 MIL/uL (ref 4.22–5.81)
RDW: 12.4 % (ref 11.5–15.5)
WBC: 5.8 10*3/uL (ref 4.0–10.5)

## 2015-07-26 LAB — TROPONIN I

## 2015-07-26 NOTE — ED Notes (Signed)
Called to lab to check on status of troponin and CMP. Lab states in process.

## 2015-07-26 NOTE — ED Notes (Signed)
Pt here for evlauation of acute onset "slowness" per his wife. Pt wife states about 1pm he was unable to correctly answer questions and was slow to respond. Wife did not note any other deficits. Pt reported a headache and a cough at the time. Pt is now alert and oriented X4. Pt reports his headache is gone.

## 2015-07-26 NOTE — ED Notes (Signed)
Some dizziness also

## 2015-07-26 NOTE — ED Notes (Signed)
The pt has had a headache for the past few days with n v.  He haf n v last week.  He was ent here by his doctor  To have xrays

## 2015-07-26 NOTE — ED Notes (Signed)
Patient transported to CT 

## 2015-07-26 NOTE — Discharge Instructions (Signed)

## 2015-07-26 NOTE — ED Provider Notes (Signed)
CSN: [id number]     Arrival date & time 07/26/15  1729 History   First MD Initiated Contact with Patient 07/26/15 1851     Chief Complaint  Patient presents with  . Headache     (Consider location/radiation/quality/duration/timing/severity/associated sxs/prior Treatment) Patient is a 74 y.o. male presenting with headaches. The history is provided by the patient and the spouse.  Headache Pain location:  Generalized Quality:  Dull Radiates to:  Does not radiate Onset quality:  Gradual Duration:  1 day Timing:  Intermittent Progression:  Resolved Chronicity:  Recurrent Similar to prior headaches: yes   Context comment:  At home with slowed cognitive abilities according to his wife Relieved by:  Nothing Worsened by:  Nothing Ineffective treatments:  None tried Associated symptoms: cough (Intermittent)   Associated symptoms: no fever, no hearing loss, no loss of balance and no weakness   Risk factors comment:  History of complicated migraines with cognitive impairment   Past Medical History  Diagnosis Date  . Thoracic aortic aneurysm   . Aortic insufficiency   . Obstructive sleep apnea     on CPAP  . Headache    Past Surgical History  Procedure Laterality Date  . Hernia repair    . Hernia repair    . Gallbladder surgery    . Hernia repair     Family History  Problem Relation Age of Onset  . Heart Problems Father     CHF  . Stroke Brother   . Dementia Brother   . Dementia Mother    History  Substance Use Topics  . Smoking status: Former Research scientist (life sciences)  . Smokeless tobacco: Never Used     Comment: quit smoking when he was 74 years old  . Alcohol Use: No    Review of Systems  Constitutional: Negative for fever.  HENT: Negative for hearing loss.   Respiratory: Positive for cough (Intermittent).   Neurological: Positive for headaches. Negative for weakness and loss of balance.  All other systems reviewed and are negative.     Allergies  Review of patient's  allergies indicates no known allergies.  Home Medications   Prior to Admission medications   Medication Sig Start Date End Date Taking? Authorizing Provider  aspirin 81 MG tablet Take 81 mg by mouth daily.    Historical Provider, MD  butalbital-acetaminophen-caffeine (FIORICET) 50-325-40 MG per tablet Take 1 tablet by mouth every 6 (six) hours as needed for headache. Do not take more than 10x a month. This is a 90 day refill. 02/14/15 02/14/16  Melvenia Beam, MD  Coenzyme Q10 (CO Q-10) 100 MG CAPS Take 100 mg by mouth daily.    Historical Provider, MD  divalproex (DEPAKOTE) 500 MG DR tablet Take 1 tablet (500 mg total) by mouth daily. 02/14/15   Melvenia Beam, MD  donepezil (ARICEPT) 10 MG tablet Take 1 tablet (10 mg total) by mouth at bedtime. 02/14/15   Melvenia Beam, MD  MAGNESIUM PO Take 1 tablet by mouth daily.    Historical Provider, MD  Multiple Vitamins-Minerals ([employer]S SPECTRAVITE ADULT 50+ PO) Take 1 tablet by mouth daily.    Historical Provider, MD  Nutritional Supplements (PROSTAMEN PO) Take 1 capsule by mouth daily.    Historical Provider, MD  Potassium 99 MG TABS Take 99 mg by mouth daily.    Historical Provider, MD   BP 126/75 mmHg  Pulse 55  Temp(Src) 97.8 F (36.6 C)  Resp 16  SpO2 97% Physical Exam  Constitutional: He is  oriented to person, place, and time. He appears well-developed and well-nourished. No distress.  HENT:  Head: Normocephalic and atraumatic.  Eyes: Conjunctivae are normal.  Neck: Neck supple. No tracheal deviation present.  Cardiovascular: Normal rate and regular rhythm.   Pulmonary/Chest: Effort normal. No respiratory distress.  Abdominal: Soft. He exhibits no distension.  Neurological: He is alert and oriented to person, place, and time. He has normal strength. No cranial nerve deficit or sensory deficit. Coordination normal. GCS eye subscore is 4. GCS verbal subscore is 5. GCS motor subscore is 6.  Normal finger to nose testing, coordination, and  speech  Skin: Skin is warm and dry.  Psychiatric: He has a normal mood and affect.    ED Course  Procedures (including critical care time) Labs Review Labs Reviewed  COMPREHENSIVE METABOLIC PANEL - Abnormal; Notable for the following:    Glucose, Bld 107 (*)    All other components within normal limits  CBC WITH DIFFERENTIAL/PLATELET  TROPONIN I    Imaging Review Dg Chest 2 View  07/26/2015   CLINICAL DATA:  74 year old male with dizziness and headache for 1 day. Bilateral chest pain intermittently over the past week.  EXAM: CHEST  2 VIEW  COMPARISON:  Chest x-ray 10/28/2014.  FINDINGS: Lung volumes are normal. No consolidative airspace disease. No pleural effusions. No pneumothorax. No pulmonary nodule or mass noted. Pulmonary vasculature and the cardiomediastinal silhouette are within normal limits. Mild bilateral apical pleuroparenchymal thickening, most compatible with chronic post infectious or inflammatory scarring, similar prior studies. Atherosclerosis in the thoracic aorta.  IMPRESSION: 1. No radiographic evidence of acute cardiopulmonary disease. 2. Atherosclerosis.   Electronically Signed   By: Vinnie Langton M.D.   On: 07/26/2015 18:29   Ct Head Wo Contrast  07/26/2015   CLINICAL DATA:  Dizziness. Headache. Symptoms for the last few days. Initial encounter.  EXAM: CT HEAD WITHOUT CONTRAST  TECHNIQUE: Contiguous axial images were obtained from the base of the skull through the vertex without intravenous contrast.  COMPARISON:  MRI 10/27/2014.  FINDINGS: No mass lesion, mass effect, midline shift, hydrocephalus, hemorrhage. No acute territorial cortical ischemia/infarct. Atrophy and chronic ischemic white matter disease is present. Large dilated perivascular space in the LEFT subinsular region is unchanged compared to prior MRI. Intracranial atherosclerosis is present.  IMPRESSION: No acute intracranial abnormality. Mild atrophy and chronic ischemic white matter disease.    Electronically Signed   By: Dereck Ligas M.D.   On: 07/26/2015 19:43   I independently viewed and interpreted the above radiology studies and agree with radiologist report.   EKG Interpretation   Date/Time:  Wednesday July 26 2015 17:37:58 EDT Ventricular Rate:  52 PR Interval:  184 QRS Duration: 92 QT Interval:  452 QTC Calculation: 420 R Axis:   68 Text Interpretation:  Sinus bradycardia Otherwise no significant change  anterolateral biphasic  t wave inversion persistent and unchanged from   prior study from 10/27/2014 Confirmed by Zailyn Rowser MD, Quillian Quince [phone]) on  07/26/2015 7:49:31 PM      MDM   Final diagnoses:  History of migraine headaches  Transient alteration of awareness    74 year old male presents with an episode earlier today where he had some slow ability to answer questions, was noted to have a headache on the way to his primary care office at 2 PM. They recommended that he come here for head CT to rule out acute pathology. On record review he sees Dr. Jaynee Eagles of neurology for couple Migraines where he has cognitive  slowing and he is on multiple medications to combat this. His symptoms have completely resolved and appear typical for his previous episodes of complicated migraine. He had no focal deficits during the episode or any abnormal findings on exam here today. At this time I do not feel that an emergent MRI is indicated nor is emergent consultation with neuro hospitalist. The family agreed to make an expedited appointment with his neurologist for close follow-up and return precautions were discussed for recurrent episodes, other concerning signs or symptoms.    Leo Grosser, MD 07/26/15 2133

## 2015-08-15 ENCOUNTER — Ambulatory Visit (INDEPENDENT_AMBULATORY_CARE_PROVIDER_SITE_OTHER): Payer: Medicare HMO | Admitting: Neurology

## 2015-08-15 ENCOUNTER — Encounter: Payer: Self-pay | Admitting: Neurology

## 2015-08-15 VITALS — BP 138/75 | HR 55 | Ht 70.0 in | Wt 174.0 lb

## 2015-08-15 DIAGNOSIS — F0391 Unspecified dementia with behavioral disturbance: Secondary | ICD-10-CM

## 2015-08-15 DIAGNOSIS — G43909 Migraine, unspecified, not intractable, without status migrainosus: Secondary | ICD-10-CM

## 2015-08-15 DIAGNOSIS — G43109 Migraine with aura, not intractable, without status migrainosus: Secondary | ICD-10-CM

## 2015-08-15 DIAGNOSIS — F03918 Unspecified dementia, unspecified severity, with other behavioral disturbance: Secondary | ICD-10-CM

## 2015-08-15 MED ORDER — DIVALPROEX SODIUM 500 MG PO DR TAB: 500.0000 mg | DELAYED_RELEASE_TABLET | Freq: Every day | ORAL | Status: DC

## 2015-08-15 MED ORDER — MEMANTINE HCL ER 28 MG PO CP24: 28.0000 mg | ORAL_CAPSULE | Freq: Every day | ORAL | Status: DC

## 2015-08-15 NOTE — Patient Instructions (Signed)
Overall you are doing fairly well but I do want to suggest a few things today:   Remember to drink plenty of fluid, eat healthy meals and do not skip any meals. Try to eat protein with a every meal and eat a healthy snack such as fruit or nuts in between meals. Try to keep a regular sleep-wake schedule and try to exercise daily, particularly in the form of walking, 20-30 minutes a day, if you can.   As far as your medications are concerned, I would like to suggest; Adding Namenda to Aricept  I would like to see you back as needed, sooner if we need to. Please call us with any interim questions, concerns, problems, updates or refill requests.   Our phone number is [phone]. We also have an after hours call service for urgent matters and there is a physician on-call for urgent questions. For any emergencies you know to call 911 or go to the nearest emergency room
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
## 2015-12-27 ENCOUNTER — Ambulatory Visit: Payer: Medicare HMO | Admitting: Cardiovascular Disease

## 2015-12-29 ENCOUNTER — Ambulatory Visit: Payer: Medicare HMO | Admitting: Cardiovascular Disease

## 2016-01-17 DIAGNOSIS — S199XXA Unspecified injury of neck, initial encounter: Secondary | ICD-10-CM | POA: Diagnosis not present

## 2016-01-17 DIAGNOSIS — S0993XA Unspecified injury of face, initial encounter: Secondary | ICD-10-CM | POA: Diagnosis not present

## 2016-01-17 DIAGNOSIS — S0081XA Abrasion of other part of head, initial encounter: Secondary | ICD-10-CM | POA: Diagnosis not present

## 2016-01-17 DIAGNOSIS — S20219A Contusion of unspecified front wall of thorax, initial encounter: Secondary | ICD-10-CM | POA: Diagnosis not present

## 2016-01-17 DIAGNOSIS — S3993XA Unspecified injury of pelvis, initial encounter: Secondary | ICD-10-CM | POA: Diagnosis not present

## 2016-01-17 DIAGNOSIS — S022XXA Fracture of nasal bones, initial encounter for closed fracture: Secondary | ICD-10-CM | POA: Diagnosis not present

## 2016-01-17 DIAGNOSIS — S3982XA Other specified injuries of lower back, initial encounter: Secondary | ICD-10-CM | POA: Diagnosis not present

## 2016-01-17 DIAGNOSIS — M25559 Pain in unspecified hip: Secondary | ICD-10-CM | POA: Diagnosis not present

## 2016-01-17 DIAGNOSIS — W01198 Fall on same level from slipping, tripping and stumbling with subsequent striking against other object: Secondary | ICD-10-CM | POA: Diagnosis not present

## 2016-01-17 DIAGNOSIS — S60512 Abrasion of left hand: Secondary | ICD-10-CM | POA: Diagnosis not present

## 2016-01-17 DIAGNOSIS — S134XXA Sprain of ligaments of cervical spine, initial encounter: Secondary | ICD-10-CM | POA: Diagnosis not present

## 2016-01-17 DIAGNOSIS — S0990XA Unspecified injury of head, initial encounter: Secondary | ICD-10-CM | POA: Diagnosis not present

## 2016-01-17 DIAGNOSIS — T148 Other injury of unspecified body region: Secondary | ICD-10-CM | POA: Diagnosis not present

## 2016-01-22 DIAGNOSIS — S022XXA Fracture of nasal bones, initial encounter for closed fracture: Secondary | ICD-10-CM | POA: Diagnosis not present

## 2016-01-22 DIAGNOSIS — J342 Deviated nasal septum: Secondary | ICD-10-CM | POA: Diagnosis not present

## 2016-01-22 DIAGNOSIS — H6121 Impacted cerumen, right ear: Secondary | ICD-10-CM | POA: Diagnosis not present

## 2016-01-23 ENCOUNTER — Encounter: Payer: Self-pay | Admitting: Cardiovascular Disease

## 2016-01-23 ENCOUNTER — Ambulatory Visit (INDEPENDENT_AMBULATORY_CARE_PROVIDER_SITE_OTHER): Payer: PPO | Admitting: Cardiovascular Disease

## 2016-01-23 VITALS — BP 130/72 | HR 65 | Ht 70.0 in | Wt 163.0 lb

## 2016-01-23 DIAGNOSIS — I712 Thoracic aortic aneurysm, without rupture, unspecified: Secondary | ICD-10-CM

## 2016-01-23 DIAGNOSIS — E785 Hyperlipidemia, unspecified: Secondary | ICD-10-CM

## 2016-01-23 NOTE — Assessment & Plan Note (Signed)
History of mild aortic insufficiency noted by 2-D echo as recently as 10/28/14 with normal LV size and function.

## 2016-01-23 NOTE — Progress Notes (Signed)
01/23/2016 Tia Alert   June 20, 1941  [id number]  Primary Physician [name], MD Primary Cardiologist: Lorretta Harp MD Renae Gloss   HPI:  Mr. Sturgell is a very pleasant 75 year old mildly overweight married Caucasian male father of 2, grandfather and 6 grandchildren who recently retired along with his wife 04/23/15. His cardiac risk factor profile is essentially benign. He was followed for thoracic aortic aneurysm by CT angiography annually, aortic insufficiency by 2-D echocardiography. He also has obstructive sleep apnea on CPAP. He denies chest pain or shortness of breath. His most recent lipid profile performed 10/28/14 revealed a total cholesterol of 128, LDL 79 and HDL of 35. His last CT angiogram performed 12/29/14 revealed a maximum dimension of 5 cm.   Current Outpatient Prescriptions  Medication Sig Dispense Refill  . aspirin 81 MG tablet Take 81 mg by mouth daily.    . butalbital-acetaminophen-caffeine (FIORICET) 50-325-40 MG per tablet Take 1 tablet by mouth every 6 (six) hours as needed for headache. Do not take more than 10x a month. This is a 90 day refill. 30 tablet 3  . divalproex (DEPAKOTE) 500 MG DR tablet Take 1 tablet (500 mg total) by mouth daily. 90 tablet 3  . donepezil (ARICEPT) 10 MG tablet Take 1 tablet (10 mg total) by mouth at bedtime. 90 tablet 3  . MAGNESIUM PO Take 1 tablet by mouth daily.    . memantine (NAMENDA XR) 28 MG CP24 24 hr capsule Take 1 capsule (28 mg total) by mouth daily. 30 capsule 11  . Multiple Vitamins-Minerals ([employer]S SPECTRAVITE ADULT 50+ PO) Take 1 tablet by mouth daily.    Marland Kitchen tobramycin-dexamethasone (TOBRADEX) ophthalmic solution Place 1 drop into both eyes as needed.  0   No current facility-administered medications for this visit.    No Known Allergies  Social History   Social History  . Marital Status: Married    Spouse Name: [name]   . Number of Children: 2  . Years of Education: 12   Occupational History    . Not on file.   Social History Main Topics  . Smoking status: Former Research scientist (life sciences)  . Smokeless tobacco: Never Used     Comment: quit smoking when he was 75 years old  . Alcohol Use: No  . Drug Use: No  . Sexual Activity: Not on file   Other Topics Concern  . Not on file   Social History Narrative   Patient lives at home with wife [name]    Patient as 2 children.    Patient is right handed.    Patient has a high school education.         Review of Systems: General: negative for chills, fever, night sweats or weight changes.  Cardiovascular: negative for chest pain, dyspnea on exertion, edema, orthopnea, palpitations, paroxysmal nocturnal dyspnea or shortness of breath Dermatological: negative for rash Respiratory: negative for cough or wheezing Urologic: negative for hematuria Abdominal: negative for nausea, vomiting, diarrhea, bright red blood per rectum, melena, or hematemesis Neurologic: negative for visual changes, syncope, or dizziness All other systems reviewed and are otherwise negative except as noted above.    Blood pressure 130/72, pulse 65, height 5\' 10"  (1.778 m), weight 163 lb (73.936 kg).  General appearance: alert and no distress Neck: no adenopathy, no carotid bruit, no JVD, supple, symmetrical, trachea midline and thyroid not enlarged, symmetric, no tenderness/mass/nodules Lungs: clear to auscultation bilaterally Heart: regular rate and rhythm, S1, S2 normal, no murmur, click, rub or gallop  Extremities: extremities normal, atraumatic, no cyanosis or edema  EKG normal sinus rhythm at 65 with nonspecific ST and T-wave changes. There were Q waves noted in the inferior leads. I personally reviewed this EKG  ASSESSMENT AND PLAN:   Thoracic aortic aneurysm History of thoracic aortic aneurysm measuring 5 cm by CTA performed 12/29/14. We will recheck a CTA  Aortic insufficiency History of mild aortic insufficiency noted by 2-D echo as recently as 10/28/14 with  normal LV size and function.      Lorretta Harp MD FACP,FACC,FAHA, [facility] 01/23/2016 9:20 AM

## 2016-01-23 NOTE — Patient Instructions (Signed)
Medication Instructions:  Your physician recommends that you continue on your current medications as directed. Please refer to the Current Medication list given to you today.   Labwork: Your physician recommends that you return for lab work in: FASTING (lipid/liver) The lab can be found on the FIRST FLOOR of out building in [address]   Testing/Procedures: Non-Cardiac CT Angiography (CTA), is a special type of CT scan that uses a computer to produce multi-dimensional views of major blood vessels throughout the body. In CT angiography, a contrast material is injected through an IV to help visualize the blood vessels  Follow-Up: Your physician wants you to follow-up in: 12 months with Dr. Gwenlyn Found. You will receive a reminder letter in the mail two months in advance. If you don't receive a letter, please call our office to schedule the follow-up appointment.   Any Other Special Instructions Will Be Listed Below (If Applicable).     If you need a refill on your cardiac medications before your next appointment, please call your pharmacy.

## 2016-01-23 NOTE — Assessment & Plan Note (Signed)
History of thoracic aortic aneurysm measuring 5 cm by CTA performed 12/29/14. We will recheck a CTA

## 2016-01-25 ENCOUNTER — Telehealth: Payer: Self-pay | Admitting: Cardiovascular Disease

## 2016-01-25 NOTE — Telephone Encounter (Signed)
Notified wife test not needed for tomorrow.  notified scheduler  Jeanene Erb to cancel  CT FOR tomorrow.

## 2016-01-25 NOTE — Telephone Encounter (Signed)
Patient had CT SCAN AT [facility]  BELOW ARE THE RESULTS:   Result Narrative  CLINICAL DATA:  Left-sided chest and neck pain radiating into the left arm for 2 hours.  EXAM: CT ANGIOGRAPHY CHEST, ABDOMEN AND PELVIS  TECHNIQUE: Multidetector CT imaging through the chest, abdomen and pelvis was performed using the standard protocol during bolus administration of intravenous contrast. Multiplanar reconstructed images and MIPs were obtained and reviewed to evaluate the vascular anatomy.  CONTRAST:  100 mL Omnipaque 350 intravenous  COMPARISON:  None.  FINDINGS: CTA CHEST FINDINGS  There is no change in the ascending aortic aneurysm, maximum diameter 5.0 cm. The aorta rapidly tapers, with the proximal descending measuring 4.0 cm in the mid descending measuring 3.0 cm. Distal descending thoracic aorta measures 2.5 cm. The aorta is normal caliber in the abdomen. Great vessel origins are all widely patent. There is no aneurysm rupture or dissection in the chest.  Review of the MIP images confirms the above findings.  There is no mediastinal or hilar adenopathy. The lungs are clear. Central airways are patent. There is no effusion.  CTA ABDOMEN AND PELVIS FINDINGS  In the abdomen the major branches of the aorta are all widely patent. The common iliac and external iliac arteries are normal in caliber and widely patent.  Review of the MIP images confirms the above findings.  There is mild fatty infiltration of the liver. There are otherwise unremarkable arterial phase appearances of the liver, spleen, pancreas and adrenals. There are benign cysts of the kidneys, the largest measuring 3.2 cm in the right midpole region. Bowel is remarkable only for extensive colonic diverticulosis.  No significant musculoskeletal lesions are evident.  IMPRESSION: 1. Unchanged 5.0 cm ascending aortic aneurysm. No evidence of dissection or aneurysm rupture. 2. Mild fatty infiltration  of the liver. 3. Colonic diverticulosis.   Electronically Signed   By: Andreas Newport M.D.   On: 11/25/2015 04:24  SPOKE TO WIFE , INFORMED HER THAT DR BERRY WILL REVIEW THE ABOVE INFORMATION ,WILL CALL BACK TO LET HER KNOW IF CT SCAN IS NEEDED TOMORROW. IF NOT IT WILL BE CANCELLED

## 2016-01-25 NOTE — Telephone Encounter (Signed)
New message      Pt is due to have a CT scan tomorrow.  Pt was in the hosp in dec at [facility].  He had a CT at that time. Will he need another CT tomorrow?

## 2016-01-25 NOTE — Telephone Encounter (Signed)
No change in TAA dimensions (5.0 cm). Repeat in 12 months

## 2016-01-26 ENCOUNTER — Inpatient Hospital Stay: Admission: RE | Admit: 2016-01-26 | Payer: PPO | Source: Ambulatory Visit

## 2016-01-26 DIAGNOSIS — H9313 Tinnitus, bilateral: Secondary | ICD-10-CM | POA: Diagnosis not present

## 2016-01-26 DIAGNOSIS — H903 Sensorineural hearing loss, bilateral: Secondary | ICD-10-CM | POA: Diagnosis not present

## 2016-01-26 DIAGNOSIS — J342 Deviated nasal septum: Secondary | ICD-10-CM | POA: Diagnosis not present

## 2016-01-26 DIAGNOSIS — S022XXG Fracture of nasal bones, subsequent encounter for fracture with delayed healing: Secondary | ICD-10-CM | POA: Diagnosis not present

## 2016-02-12 DIAGNOSIS — I10 Essential (primary) hypertension: Secondary | ICD-10-CM | POA: Diagnosis not present

## 2016-02-12 DIAGNOSIS — Z79899 Other long term (current) drug therapy: Secondary | ICD-10-CM | POA: Diagnosis not present

## 2016-02-12 DIAGNOSIS — I259 Chronic ischemic heart disease, unspecified: Secondary | ICD-10-CM | POA: Diagnosis not present

## 2016-02-12 DIAGNOSIS — G20 Parkinson's disease: Secondary | ICD-10-CM | POA: Diagnosis not present

## 2016-02-12 DIAGNOSIS — J069 Acute upper respiratory infection, unspecified: Secondary | ICD-10-CM | POA: Diagnosis not present

## 2016-02-12 DIAGNOSIS — Z125 Encounter for screening for malignant neoplasm of prostate: Secondary | ICD-10-CM | POA: Diagnosis not present

## 2016-02-12 DIAGNOSIS — D649 Anemia, unspecified: Secondary | ICD-10-CM | POA: Diagnosis not present

## 2016-02-12 DIAGNOSIS — R42 Dizziness and giddiness: Secondary | ICD-10-CM | POA: Diagnosis not present

## 2016-02-12 DIAGNOSIS — E559 Vitamin D deficiency, unspecified: Secondary | ICD-10-CM | POA: Diagnosis not present

## 2016-02-13 ENCOUNTER — Telehealth: Payer: Self-pay | Admitting: Neurology

## 2016-02-13 NOTE — Telephone Encounter (Signed)
Pt's daughter called sts he has been having tremors in bil hands and arms. This is a new symptom. Will she need to get referral? Daughter is frustrated.

## 2016-02-13 NOTE — Telephone Encounter (Signed)
Spoke to [employer] and we do need a new referral since its a new problem. Called daughter back. Relayed message. She verbalized understanding.

## 2016-02-15 NOTE — Telephone Encounter (Signed)
error 

## 2016-02-21 ENCOUNTER — Encounter: Payer: Self-pay | Admitting: Neurology

## 2016-02-21 ENCOUNTER — Ambulatory Visit (INDEPENDENT_AMBULATORY_CARE_PROVIDER_SITE_OTHER): Payer: PPO | Admitting: Neurology

## 2016-02-21 VITALS — BP 109/64 | HR 55 | Ht 70.0 in | Wt 161.0 lb

## 2016-02-21 DIAGNOSIS — R269 Unspecified abnormalities of gait and mobility: Secondary | ICD-10-CM | POA: Diagnosis not present

## 2016-02-21 DIAGNOSIS — R251 Tremor, unspecified: Secondary | ICD-10-CM

## 2016-02-21 MED ORDER — DIVALPROEX SODIUM ER 250 MG PO TB24: 250.0000 mg | ORAL_TABLET | Freq: Every day | ORAL | Status: DC
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
## 2016-02-21 NOTE — Patient Instructions (Signed)
Either medication effect or essential tremor.  Remember to drink plenty of fluid, eat healthy meals and do not skip any meals. Try to eat protein with a every meal and eat a healthy snack such as fruit or nuts in between meals. Try to keep a regular sleep-wake schedule and try to exercise daily, particularly in the form of walking, 20-30 minutes a day, if you can.   As far as your medications are concerned, I would like to suggest: Decrease Depakote to 250mg  a night.   As far as diagnostic testing: labs  I would like to see you back in 6 weeks, sooner if we need to. Please call us with any interim questions, concerns, problems, updates or refill requests.   Our phone number is [phone]. We also have an after hours call service for urgent matters and there is a physician on-call for urgent questions. For any emergencies you know to call 911 or go to the nearest emergency room

## 2016-02-23 ENCOUNTER — Telehealth: Payer: Self-pay | Admitting: Neurology

## 2016-02-23 LAB — COMPREHENSIVE METABOLIC PANEL
ALK PHOS: 71 IU/L (ref 39–117)
ALT: 21 IU/L (ref 0–44)
AST: 23 IU/L (ref 0–40)
Albumin/Globulin Ratio: 1.4 (ref 1.1–2.5)
Albumin: 4 g/dL (ref 3.5–4.8)
BILIRUBIN TOTAL: 0.7 mg/dL (ref 0.0–1.2)
BUN / CREAT RATIO: 29 — AB (ref 10–22)
BUN: 17 mg/dL (ref 8–27)
CHLORIDE: 103 mmol/L (ref 96–106)
CO2: 25 mmol/L (ref 18–29)
Calcium: 9 mg/dL (ref 8.6–10.2)
Creatinine, Ser: 0.58 mg/dL — ABNORMAL LOW (ref 0.76–1.27)
GFR calc non Af Amer: 100 mL/min/{1.73_m2} (ref >59)
GFR, EST AFRICAN AMERICAN: 115 mL/min/{1.73_m2} (ref >59)
GLOBULIN, TOTAL: 2.9 g/dL (ref 1.5–4.5)
GLUCOSE: 87 mg/dL (ref 65–99)
Potassium: 4.7 mmol/L (ref 3.5–5.2)
SODIUM: 144 mmol/L (ref 134–144)
TOTAL PROTEIN: 6.9 g/dL (ref 6.0–8.5)

## 2016-02-23 LAB — VALPROIC ACID LEVEL: Valproic Acid Lvl: 43 ug/mL — ABNORMAL LOW (ref 50–100)

## 2016-02-23 LAB — THYROID PANEL WITH TSH
Free Thyroxine Index: 2.1 (ref 1.2–4.9)
T3 Uptake Ratio: 28 % (ref 24–39)
T4, Total: 7.5 ug/dL (ref 4.5–12.0)
TSH: 1.44 u[IU]/mL (ref 0.450–4.500)

## 2016-02-23 NOTE — Telephone Encounter (Signed)
I hadn't placed it yet my apologies, I placed the referral just now. Please let her know thanks.

## 2016-02-23 NOTE — Telephone Encounter (Signed)
Pt's wife called sts at last OV it was discussed about pt starting PT for not picking up his feet and "bent over walking". She is inquiring when referral done.

## 2016-02-26 ENCOUNTER — Telehealth: Payer: Self-pay | Admitting: *Deleted

## 2016-02-26 DIAGNOSIS — I10 Essential (primary) hypertension: Secondary | ICD-10-CM | POA: Diagnosis not present

## 2016-02-26 DIAGNOSIS — Z0000 Encounter for general adult medical examination without abnormal findings: Secondary | ICD-10-CM | POA: Diagnosis not present

## 2016-02-26 DIAGNOSIS — G43909 Migraine, unspecified, not intractable, without status migrainosus: Secondary | ICD-10-CM | POA: Diagnosis not present

## 2016-02-26 DIAGNOSIS — F0390 Unspecified dementia without behavioral disturbance: Secondary | ICD-10-CM | POA: Diagnosis not present

## 2016-02-26 NOTE — Telephone Encounter (Signed)
Pt's wife returned call , may call [phone]. Thank you

## 2016-02-26 NOTE — Telephone Encounter (Signed)
LVM for pt to call about results. Gave [facility] phone number.  

## 2016-02-26 NOTE — Telephone Encounter (Signed)
Neuro Rehab scheduled appt for 02/28/16

## 2016-02-26 NOTE — Telephone Encounter (Signed)
Called and spoke to wife about lab results per Dr Jaynee Eagles note. Advised he needs to drink more water throughout the day. She verbalized understanding.

## 2016-02-26 NOTE — Telephone Encounter (Signed)
-----   Message from Melvenia Beam, MD sent at 02/23/2016  8:08 AM EST ----- Labs look fine except he is a little dehydrated. You can ask him to drink a little more water during the day. thanks

## 2016-02-28 ENCOUNTER — Ambulatory Visit: Payer: PPO | Admitting: Physical Therapy

## 2016-02-29 ENCOUNTER — Ambulatory Visit: Payer: PPO | Attending: Neurology | Admitting: Physical Therapy

## 2016-02-29 DIAGNOSIS — R29898 Other symptoms and signs involving the musculoskeletal system: Secondary | ICD-10-CM | POA: Diagnosis not present

## 2016-02-29 DIAGNOSIS — R293 Abnormal posture: Secondary | ICD-10-CM | POA: Diagnosis not present

## 2016-02-29 DIAGNOSIS — Z9181 History of falling: Secondary | ICD-10-CM | POA: Insufficient documentation

## 2016-02-29 DIAGNOSIS — R269 Unspecified abnormalities of gait and mobility: Secondary | ICD-10-CM | POA: Diagnosis not present

## 2016-02-29 NOTE — Therapy (Signed)
[facility] [address] [place], [place], [address] Phone: [phone]   Fax:  [phone]  Physical Therapy Evaluation  Patient Details  Name: Christopher Atkinson MRN: [id number] Date of Birth: 03/14/1941 Referring Provider: Melvenia Beam  Encounter Date: 02/29/2016      PT End of Session - 02/29/16 0849    Visit Number 1   Number of Visits 17   Date for PT Re-Evaluation 04/25/16   PT Start Time 0750   PT Stop Time 0830   PT Time Calculation (min) 40 min   Activity Tolerance Patient tolerated treatment well   Behavior During Therapy [facility] for tasks assessed/performed      Past Medical History  Diagnosis Date  . Thoracic aortic aneurysm ([place])   . Aortic insufficiency   . Obstructive sleep apnea     on CPAP  . Headache     Past Surgical History  Procedure Laterality Date  . Hernia repair    . Hernia repair    . Gallbladder surgery    . Hernia repair      There were no vitals filed for this visit.  Visit Diagnosis:  Abnormality of gait - Plan: PT plan of care cert/re-cert  Weakness of both lower extremities - Plan: PT plan of care cert/re-cert  Risk for falls - Plan: PT plan of care cert/re-cert  Abnormal posture - Plan: PT plan of care cert/re-cert      Subjective Assessment - 02/29/16 0753    Subjective Pt is a 75 y/o male who presents to OPPT due to a fall and gait instability.  Pt's wife provides most history.  Fall reports in Jan 2017 resulting in broken nose.  Pt with forward flexed posture with ambulation; and states this is ongoing for "several years" with worsening of symptoms in past 6-8 months.   Patient is accompained by: Family member   Pertinent History dementia, memory loss, lumbar surgery with removal of large blood clot   Patient Stated Goals improve walking and standing posture   Currently in Pain? No/denies  c/o "backache"            [facility] PT Assessment - 02/29/16 0758     Assessment   Medical Diagnosis abnormality of gait; fall risk   Referring Provider Sarina Ill B   Onset Date/Surgical Date --  "years"   Next MD Visit April 2017   Prior Therapy none   Precautions   Precautions Fall   Restrictions   Weight Bearing Restrictions No   Balance Screen   Has the patient fallen in the past 6 months Yes   How many times? 6   Has the patient had a decrease in activity level because of a fear of falling?  No   Is the patient reluctant to leave their home because of a fear of falling?  No   Home Environment   Living Environment Private residence   Living Arrangements Spouse/significant other   Available Help at Discharge Family   Type of [place] to enter   Entrance Stairs-Number of Steps [place] One level   [place] - single point   Prior Function   Level of Independence Independent  supervision   Vocation Retired   Leisure has home at [employer]; "live," fish   Cognition   Overall Cognitive Status History of cognitive impairments - at baseline   Observation/Other Assessments  Focus on Therapeutic Outcomes (FOTO)  60 (40% limited; predicted 30% limited)   Activities of Balance Confidence Scale (ABC Scale)  65.6%   Strength   Strength Assessment Site Hip;Knee;Ankle   Right Hip Flexion 3+/5   Left Hip Flexion 3+/5   Right/Left Knee Right;Left   Right Knee Flexion 4/5   Right Knee Extension 4/5   Left Knee Flexion 4/5   Left Knee Extension 4/5   Right Ankle Dorsiflexion 5/5   Left Ankle Dorsiflexion 4/5   Ambulation/Gait   Ambulation/Gait Yes   Ambulation/Gait Assistance 4: Min guard   Ambulation Distance (Feet) 120 Feet   Assistive device None   Gait Pattern Decreased dorsiflexion - right;Decreased dorsiflexion - left;Shuffle;Trunk flexed;Poor foot clearance - left;Poor foot clearance - right  bil foot slap   Ambulation Surface Level;Indoor   Standardized Balance  Assessment   Standardized Balance Assessment Berg Balance Test;Timed Up and Go Test;10 meter walk test   10 Meter Walk 3.18 ft/sec  10.31 sec   Berg Balance Test   Sit to Stand Able to stand without using hands and stabilize independently   Standing Unsupported Able to stand safely 2 minutes   Sitting with Back Unsupported but Feet Supported on Floor or Stool Able to sit safely and securely 2 minutes   Stand to Sit Sits safely with minimal use of hands   Transfers Able to transfer safely, minor use of hands   Standing Unsupported with Eyes Closed Able to stand 10 seconds with supervision   Standing Ubsupported with Feet Together Able to place feet together independently but unable to hold for 30 seconds   From Standing, Reach Forward with Outstretched Arm Can reach forward >12 cm safely (5")   From Standing Position, Pick up Object from Floor Able to pick up shoe, needs supervision   From Standing Position, Turn to Look Behind Over each Shoulder Needs supervision when turning   Turn 360 Degrees Needs close supervision or verbal cueing   Standing Unsupported, Alternately Place Feet on Step/Stool Able to complete 4 steps without aid or supervision   Standing Unsupported, One Foot in [place] to take small step independently and hold 30 seconds   Standing on One Leg Able to lift leg independently and hold equal to or more than 3 seconds   Total Score 39   Timed Up and Go Test   Normal TUG (seconds) 14.53                           PT Education - 02/29/16 0849    Education provided Yes   Education Details clinical findings, POC   Person(s) Educated Patient;Spouse   Methods Explanation   Comprehension Verbalized understanding          PT Short Term Goals - 02/29/16 0903    PT SHORT TERM GOAL #1   Title independent with HEP (03/28/16)   Time 4   Period Weeks   Status New   PT SHORT TERM GOAL #2   Title improve BERG balance score to >/= 44/56 for improved  function and balance (03/28/16)   Time 4   Period Weeks   Status New   PT SHORT TERM GOAL #3   Title ambulate > 300' with LRAD on indoor/paved outdoor surfaces with supervision for improved function and mobility (03/28/16)   Time 4   Period Weeks   Status New           PT Long  Term Goals - 2016/03/09 0904    PT LONG TERM GOAL #1   Title verbalize understanding of posture/body mechanics to decrease risk of reinjury (04/25/16)   Time 8   Period Weeks   Status New   PT LONG TERM GOAL #2   Title improve BERG balance score to >/= 48/56 for improved function and mobility (04/25/16)   Time 8   Period Weeks   Status New   PT LONG TERM GOAL #3   Title improve timed up and go to < 13 sec for improved mobility and decreased fall risk (04/25/16)   Time 8   Period Weeks   Status New   PT LONG TERM GOAL #4   Title ambulate > 500' on various indoor/outdoor surfaces modified independent and LRAD for improved mobility and independence (04/25/16)   Time 8   Period Weeks   Status New               Plan - Mar 09, 2016 0849    Clinical Impression Statement Pt is a 75 y/o male who presents to OPPT for moderate complexity evaluation of gait instability and high fall risk.  Pt is a high fall risk as indicated by BERG and timed up and go.  Pt demonstrates postural and gait abnormalities contributing to fall risk. Pt with hx of cognitive deficits which are likely contributing to abnormalities and discussed implications and rehab potential with pt and wife.  Will benefit from PT to maximize function, independence and decrease fall risk.    Pt will benefit from skilled therapeutic intervention in order to improve on the following deficits Abnormal gait;Postural dysfunction;Decreased strength;Difficulty walking;Decreased balance;Decreased mobility;Decreased knowledge of precautions  will monitor pain in back however will not directly address   Rehab Potential Good   PT Frequency 2x / week   PT Duration 8 weeks    PT Treatment/Interventions ADLs/Self Care Home Management;Patient/family education;Neuromuscular re-education;Balance training;Therapeutic exercise;Therapeutic activities;Functional mobility training;Stair training;Gait training;Vestibular   PT Next Visit Plan [facility]TAG[facility] HEP; lower extremity strengthening/stretching   Consulted and Agree with Plan of Care Patient;Family member/caregiver   Family Member Consulted wife          G-Codes - 03/09/2016 0907    Functional Assessment Tool Used BERG 39/56   Functional Limitation Mobility: Walking and moving around   Mobility: Walking and Moving Around Current Status [phone]) At least 20 percent but less than 40 percent impaired, limited or restricted   Mobility: Walking and Moving Around Goal Status [phone]) At least 1 percent but less than 20 percent impaired, limited or restricted       Problem List Patient Active Problem List   Diagnosis Date Noted  . Agitation 11/18/2014  . 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 Cognitive and behavioral changes 11/05/2014  . Altered mental status   . History of migraine headaches   . Acute CVA (cerebrovascular accident) ([place]) 10/27/2014  . Aphasia   . Thoracic aortic aneurysm ([place]) 11/04/2013  . Aortic insufficiency 11/04/2013  . Obstructive sleep apnea 11/04/2013   Laureen Abrahams, PT, DPT 03-09-2016 9:09 AM  [facility] [address] [place], [place], [address] Phone: [phone]   Fax:  [phone]  Name: Christopher Atkinson Current MRN: [id number] Date of Birth: 24-Mar-1941

## 2016-03-06 ENCOUNTER — Ambulatory Visit: Payer: PPO | Admitting: Physical Therapy

## 2016-03-06 DIAGNOSIS — R293 Abnormal posture: Secondary | ICD-10-CM

## 2016-03-06 DIAGNOSIS — R269 Unspecified abnormalities of gait and mobility: Secondary | ICD-10-CM | POA: Diagnosis not present

## 2016-03-06 DIAGNOSIS — R29898 Other symptoms and signs involving the musculoskeletal system: Secondary | ICD-10-CM

## 2016-03-06 DIAGNOSIS — Z9181 History of falling: Secondary | ICD-10-CM

## 2016-03-06 NOTE — Therapy (Signed)
[facility] [address] [place], [place], [address] Phone: [phone]   Fax:  [phone]  Physical Therapy Treatment  Patient Details  Name: Christopher Atkinson MRN: [id number] Date of Birth: September 01, 1941 Referring Provider: Melvenia Beam  Encounter Date: 03/06/2016      PT End of Session - 03/06/16 0916    Visit Number 2   Number of Visits 17   Date for PT Re-Evaluation 04/25/16   PT Start Time 0834   PT Stop Time 0912   PT Time Calculation (min) 38 min   Activity Tolerance Patient tolerated treatment well   Behavior During Therapy [facility] for tasks assessed/performed      Past Medical History  Diagnosis Date  . Thoracic aortic aneurysm ([place])   . Aortic insufficiency   . Obstructive sleep apnea     on CPAP  . Headache     Past Surgical History  Procedure Laterality Date  . Hernia repair    . Hernia repair    . Gallbladder surgery    . Hernia repair      There were no vitals filed for this visit.  Visit Diagnosis:  Abnormality of gait  Weakness of both lower extremities  Risk for falls  Abnormal posture      Subjective Assessment - 03/06/16 0836    Subjective Has been walking some at home; wife still has to remind pt to stand up straight.    Patient is accompained by: Family member   Pertinent History dementia, memory loss, lumbar surgery with removal of large blood clot   Patient Stated Goals improve walking and standing posture   Currently in Pain? No/denies                              Balance Exercises - 03/06/16 0837    OTAGO PROGRAM   Head Movements Standing;5 reps   Neck Movements Standing;5 reps   Back Extension Standing;5 reps   Trunk Movements Standing;5 reps   Ankle Movements Sitting;10 reps   Knee Extensor 10 reps;Weight (comment)  3   Knee Flexor 10 reps;Weight (comment)  3   Hip ABductor 10 reps;Weight (comment)  3   Ankle Plantorflexors 20 reps,  support   Ankle Dorsiflexors 20 reps, support   Knee Bends 10 reps, support   Backwards Walking Support   Walking and Turning Around No assistive device   Sideways Walking Assistive device   Tandem Stance 10 seconds, support   Tandem Walk Support             PT Short Term Goals - 03/06/16 [id number]    PT SHORT TERM GOAL #1   Title independent with HEP (03/28/16)   Status On-going   PT SHORT TERM GOAL #2   Title improve BERG balance score to >/= 44/56 for improved function and balance (03/28/16)   Status On-going   PT SHORT TERM GOAL #3   Title ambulate > 300' with LRAD on indoor/paved outdoor surfaces with supervision for improved function and mobility (03/28/16)   Status On-going           PT Long Term Goals - 03/06/16 [id number]    PT LONG TERM GOAL #1   Title verbalize understanding of posture/body mechanics to decrease risk of reinjury (04/25/16)   Status On-going   PT LONG TERM GOAL #2   Title improve BERG balance score to >/= 48/56 for improved function and mobility (04/25/16)  Status On-going   PT LONG TERM GOAL #3   Title improve timed up and go to < 13 sec for improved mobility and decreased fall risk (04/25/16)   Status On-going               Plan - 03/06/16 0917    Clinical Impression Statement Initiated OTAGO balance program and pt able to perform with min cues for posture.  Pt's wife very supportive and will assist at home.   PT Next Visit Plan continue OTAGO; lower ext strengthening/stretching. balance   Consulted and Agree with Plan of Care Patient;Family member/caregiver   Family Member Consulted wife        Problem List Patient Active Problem List   Diagnosis Date Noted  . Agitation 11/18/2014  . 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 Cognitive and behavioral changes 11/05/2014  . Altered mental status   . History of migraine headaches   . Acute CVA (cerebrovascular accident) ([place]) 10/27/2014  . Aphasia   . Thoracic aortic aneurysm ([place]) 11/04/2013  .  Aortic insufficiency 11/04/2013  . Obstructive sleep apnea 11/04/2013   Laureen Abrahams, PT, DPT 03/06/2016 9:23 AM  [facility] [address] [place], [place], [address] Phone: [phone]   Fax:  [phone]  Name: Christopher Atkinson MRN: [id number] Date of Birth: May 15, 1941

## 2016-03-11 ENCOUNTER — Other Ambulatory Visit: Payer: Self-pay | Admitting: Neurology

## 2016-03-11 ENCOUNTER — Ambulatory Visit: Payer: PPO | Admitting: Physical Therapy

## 2016-03-11 DIAGNOSIS — R293 Abnormal posture: Secondary | ICD-10-CM

## 2016-03-11 DIAGNOSIS — R269 Unspecified abnormalities of gait and mobility: Secondary | ICD-10-CM | POA: Diagnosis not present

## 2016-03-11 DIAGNOSIS — R29898 Other symptoms and signs involving the musculoskeletal system: Secondary | ICD-10-CM

## 2016-03-11 DIAGNOSIS — Z9181 History of falling: Secondary | ICD-10-CM

## 2016-03-11 NOTE — Therapy (Signed)
[facility] [address] [place], [place], [address] Phone: [phone]   Fax:  [phone]  Physical Therapy Treatment  Patient Details  Name: Christopher Atkinson MRN: [id number] Date of Birth: 1941/06/09 Referring Provider: Melvenia Beam  Encounter Date: 03/11/2016      PT End of Session - 03/11/16 0906    Visit Number --   Number of Visits 17   Date for PT Re-Evaluation 04/25/16   PT Start Time [id number]  pt arrived late   PT Stop Time 0911   PT Time Calculation (min) 29 min   Activity Tolerance Patient tolerated treatment well   Behavior During Therapy [facility] for tasks assessed/performed      Past Medical History  Diagnosis Date  . Thoracic aortic aneurysm ([place])   . Aortic insufficiency   . Obstructive sleep apnea     on CPAP  . Headache     Past Surgical History  Procedure Laterality Date  . Hernia repair    . Hernia repair    . Gallbladder surgery    . Hernia repair      There were no vitals filed for this visit.  Visit Diagnosis:  Abnormality of gait  Weakness of both lower extremities  Risk for falls  Abnormal posture      Subjective Assessment - 03/11/16 0842    Subjective doing well; no falls this weekend   Patient Stated Goals improve walking and standing posture   Currently in Pain? No/denies  "I'm alright"                         [facility] Adult PT Treatment/Exercise - 03/11/16 0901    Exercises   Exercises Knee/Hip   Knee/Hip Exercises: Supine   Short Arc Quad Sets Both;10 reps   Short Arc Quad Sets Limitations 2#   Bridges Both;10 reps   Straight Leg Raises Both;10 reps   Straight Leg Raises Limitations 2#             Balance Exercises - 03/11/16 0844    OTAGO PROGRAM   One Leg Stand 10 seconds, support   Heel Walking Support   Toe Walk Support   Sit to Stand 10 reps, no support           PT Education - 03/11/16 0906    Education provided Yes   Education Details OTAGO   Person(s) Educated Patient;Spouse   Methods Explanation;Demonstration;Handout   Comprehension Verbalized understanding;Returned demonstration;Need further instruction          PT Short Term Goals - 03/06/16 [id number]    PT SHORT TERM GOAL #1   Title independent with HEP (03/28/16)   Status On-going   PT SHORT TERM GOAL #2   Title improve BERG balance score to >/= 44/56 for improved function and balance (03/28/16)   Status On-going   PT SHORT TERM GOAL #3   Title ambulate > 300' with LRAD on indoor/paved outdoor surfaces with supervision for improved function and mobility (03/28/16)   Status On-going           PT Long Term Goals - 03/06/16 [id number]    PT LONG TERM GOAL #1   Title verbalize understanding of posture/body mechanics to decrease risk of reinjury (04/25/16)   Status On-going   PT LONG TERM GOAL #2   Title improve BERG balance score to >/= 48/56 for improved function and mobility (04/25/16)   Status On-going   PT LONG TERM  GOAL #3   Title improve timed up and go to < 13 sec for improved mobility and decreased fall risk (04/25/16)   Status On-going               Plan - 03/11/16 0906    Clinical Impression Statement Completed OTAGO and provided for home exercises.  Instructed to perform every other day with walking on off days.  Pt need mod cues for technique with exercises at mat level.   PT Next Visit Plan lower ext strengthening/stretching. balance   Consulted and Agree with Plan of Care Patient;Family member/caregiver   Family Member Consulted wife        Problem List Patient Active Problem List   Diagnosis Date Noted  . Agitation 11/18/2014  . 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 Cognitive and behavioral changes 11/05/2014  . Altered mental status   . History of migraine headaches   . Acute CVA (cerebrovascular accident) ([place]) 10/27/2014  . Aphasia   . Thoracic aortic aneurysm ([place]) 11/04/2013  . Aortic insufficiency 11/04/2013  .  Obstructive sleep apnea 11/04/2013   Laureen Abrahams, PT, DPT 03/11/2016 9:13 AM   [facility] [address] [place], [place], [address] Phone: [phone]   Fax:  [phone]  Name: Christopher Atkinson MRN: [id number] Date of Birth: 29-Dec-1940

## 2016-03-13 ENCOUNTER — Encounter: Payer: Self-pay | Admitting: *Deleted

## 2016-03-13 NOTE — Progress Notes (Signed)
Faxed printed rx fioricet to pt pharmacy. [phone]. Received confirmation.

## 2016-03-14 ENCOUNTER — Ambulatory Visit: Payer: PPO | Admitting: Physical Therapy

## 2016-03-14 DIAGNOSIS — Z9181 History of falling: Secondary | ICD-10-CM

## 2016-03-14 DIAGNOSIS — R269 Unspecified abnormalities of gait and mobility: Secondary | ICD-10-CM

## 2016-03-14 DIAGNOSIS — R293 Abnormal posture: Secondary | ICD-10-CM

## 2016-03-14 DIAGNOSIS — R29898 Other symptoms and signs involving the musculoskeletal system: Secondary | ICD-10-CM

## 2016-03-14 NOTE — Patient Instructions (Signed)
HIP: Hamstrings - Short Sitting    Rest leg on raised surface. Keep knee straight. Lift chest. Hold _30__ seconds. _2-3__ reps per set, _1-2__ sets per day, _7__ days per week  Copyright  VHI. All rights reserved.   ANKLE: Dorsiflexion - Sitting    Sitting, place strap around foot. Pull foot toward body, keeping heel on floor. Keep foot straight. Hold _30__ seconds. _2-3__ reps per set, _1-2__ sets per day, _7__ days per week  Copyright  VHI. All rights reserved.

## 2016-03-14 NOTE — Therapy (Signed)
[facility] [address] [place], [place], [address] Phone: [phone]   Fax:  [phone]  Physical Therapy Treatment  Patient Details  Name: Christopher Atkinson MRN: [id number] Date of Birth: [birth year] Referring Provider: Melvenia Beam  Encounter Date: 03/14/2016      PT End of Session - 03/14/16 0912    Visit Number 4   Number of Visits 17   Date for PT Re-Evaluation 04/25/16   PT Start Time 0830   PT Stop Time 0909   PT Time Calculation (min) 39 min   Activity Tolerance Patient tolerated treatment well   Behavior During Therapy [facility] for tasks assessed/performed      Past Medical History  Diagnosis Date  . Thoracic aortic aneurysm ([place])   . Aortic insufficiency   . Obstructive sleep apnea     on CPAP  . Headache     Past Surgical History  Procedure Laterality Date  . Hernia repair    . Hernia repair    . Gallbladder surgery    . Hernia repair      There were no vitals filed for this visit.  Visit Diagnosis:  Abnormality of gait  Weakness of both lower extremities  Risk for falls  Abnormal posture      Subjective Assessment - 03/14/16 0836    Subjective no pain today; doing well   Pertinent History dementia, memory loss, lumbar surgery with removal of large blood clot   Patient Stated Goals improve walking and standing posture   Currently in Pain? No/denies                         [facility] Adult PT Treatment/Exercise - 03/14/16 0836    Ambulation/Gait   Ambulation/Gait Yes   Ambulation/Gait Assistance 5: Supervision   Ambulation/Gait Assistance Details cues for posture; heel strike and step length - multiple cues needed due to decreased attention to task   Ambulation Distance (Feet) 200 Feet   Assistive device None   Exercises   Exercises Lumbar;Knee/Hip   Lumbar Exercises: Stretches   Lower Trunk Rotation 3 reps;20 seconds   Knee/Hip Exercises: Stretches   Passive  Hamstring Stretch Both;2 reps;30 seconds   Passive Hamstring Stretch Limitations seated   Gastroc Stretch Both;2 reps;30 seconds   Gastroc Stretch Limitations seated with towel   Knee/Hip Exercises: Aerobic   Nustep L6 four extremities x 8 min   Knee/Hip Exercises: Supine   Bridges Both;10 reps             Balance Exercises - 03/14/16 0910    Balance Exercises: Standing   Stepping Strategy Anterior;10 reps  with noodle behind back for posture; cues for weight shift           PT Education - 03/14/16 0912    Education provided Yes   Education Details hamstring/gastroc stretching   Person(s) Educated Patient   Methods Explanation;Demonstration;Handout   Comprehension Verbalized understanding;Returned demonstration;Need further instruction          PT Short Term Goals - 03/06/16 [id number]    PT SHORT TERM GOAL #1   Title independent with HEP (03/28/16)   Status On-going   PT SHORT TERM GOAL #2   Title improve BERG balance score to >/= 44/56 for improved function and balance (03/28/16)   Status On-going   PT SHORT TERM GOAL #3   Title ambulate > 300' with LRAD on indoor/paved outdoor surfaces with supervision for improved function and mobility (  03/28/16)   Status On-going           PT Long Term Goals - 03/14/16 0913    PT LONG TERM GOAL #1   Title verbalize understanding of posture/body mechanics to decrease risk of reinjury (04/25/16)   Status On-going   PT LONG TERM GOAL #2   Title improve BERG balance score to >/= 48/56 for improved function and mobility (04/25/16)   Status On-going   PT LONG TERM GOAL #3   Title improve timed up and go to < 13 sec for improved mobility and decreased fall risk (04/25/16)   Status On-going   PT LONG TERM GOAL #4   Title ambulate > 500' on various indoor/outdoor surfaces modified independent and LRAD for improved mobility and independence (04/25/16)   Status On-going               Plan - 03/14/16 0912    Clinical Impression  Statement Pt needs frequent cues for step length to decrease shuffling with ambulation.  Session focued on posture and strengthening.  Will continue to benefit from PT to maximize function and independence.   PT Next Visit Plan lower ext strengthening/stretching. balance, gait   Consulted and Agree with Plan of Care Patient        Problem List Patient Active Problem List   Diagnosis Date Noted  . Agitation 11/18/2014  . 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 Cognitive and behavioral changes 11/05/2014  . Altered mental status   . History of migraine headaches   . Acute CVA (cerebrovascular accident) ([place]) 10/27/2014  . Aphasia   . Thoracic aortic aneurysm ([place]) 11/04/2013  . Aortic insufficiency 11/04/2013  . Obstructive sleep apnea 11/04/2013   Laureen Abrahams, PT, DPT 03/14/2016 9:15 AM   [facility] [address] [place], [place], [address] Phone: [phone]   Fax:  [phone]  Name: Christopher Atkinson MRN: [id number] Date of Birth: [birth year]

## 2016-03-15 ENCOUNTER — Telehealth: Payer: Self-pay | Admitting: Neurology

## 2016-03-15 NOTE — Telephone Encounter (Signed)
Faxed completed/signed form back to [facility] as requested. Fax: [phone]. Received confirmation.

## 2016-03-15 NOTE — Telephone Encounter (Signed)
I received PA form. Awaiting Dr Cathren Laine signature.

## 2016-03-15 NOTE — Telephone Encounter (Signed)
Butch Penny with [facility] [phone] called and will fax over PA form. Please use ref # [id number]- 1) is pt continuing or initial therapy  2) whether she has tension or muscle HA's or short term pain or other. You can call with the information

## 2016-03-18 ENCOUNTER — Ambulatory Visit: Payer: PPO | Admitting: Physical Therapy

## 2016-03-18 DIAGNOSIS — R293 Abnormal posture: Secondary | ICD-10-CM

## 2016-03-18 DIAGNOSIS — R269 Unspecified abnormalities of gait and mobility: Secondary | ICD-10-CM

## 2016-03-18 DIAGNOSIS — R29898 Other symptoms and signs involving the musculoskeletal system: Secondary | ICD-10-CM

## 2016-03-18 DIAGNOSIS — Z9181 History of falling: Secondary | ICD-10-CM

## 2016-03-18 NOTE — Telephone Encounter (Signed)
Received update from [employer]. ([id number] *HTA) Fioricet approved from 03/17/16 to 12/22/16. Limitations include 180 tablets per 30 days. An additional approval may be required if future prescriptions exceed this quantity.

## 2016-03-18 NOTE — Therapy (Signed)
[facility] [address] [place], [place], [address] Phone: [phone]   Fax:  [phone]  Physical Therapy Treatment  Patient Details  Name: Christopher Atkinson MRN: [id number] Date of Birth: 04-25-1941 Referring Provider: Melvenia Beam  Encounter Date: 03/18/2016      PT End of Session - 03/18/16 0912    Visit Number 5   Number of Visits 17   Date for PT Re-Evaluation 04/25/16   PT Start Time 0831   PT Stop Time 0910   PT Time Calculation (min) 39 min   Equipment Utilized During Treatment Gait belt   Activity Tolerance Patient tolerated treatment well   Behavior During Therapy [facility] for tasks assessed/performed      Past Medical History  Diagnosis Date  . Thoracic aortic aneurysm ([place])   . Aortic insufficiency   . Obstructive sleep apnea     on CPAP  . Headache     Past Surgical History  Procedure Laterality Date  . Hernia repair    . Hernia repair    . Gallbladder surgery    . Hernia repair      There were no vitals filed for this visit.  Visit Diagnosis:  Abnormality of gait  Weakness of both lower extremities  Risk for falls  Abnormal posture      Subjective Assessment - 03/18/16 0828    Subjective no complaints; no falls. doing "some" exercises   Patient is accompained by: Family member   Pertinent History dementia, memory loss, lumbar surgery with removal of large blood clot   Patient Stated Goals improve walking and standing posture   Currently in Pain? No/denies                         [facility] Adult PT Treatment/Exercise - 03/18/16 0834    Knee/Hip Exercises: Stretches   Passive Hamstring Stretch Both;2 reps;30 seconds   Passive Hamstring Stretch Limitations seated   Knee/Hip Exercises: Aerobic   Nustep L6 four extremities x 8 min   Knee/Hip Exercises: Supine   Bridges Both;10 reps   Straight Leg Raises Both;10 reps   Straight Leg Raises Limitations 2#   Other  Supine Knee/Hip Exercises single limb hip abdct with green tband x 10 bil             Balance Exercises - 03/18/16 0911    Balance Exercises: Standing   Stepping Strategy Anterior;Posterior;10 reps  with noodle behind back and mod cues for sequencing              PT Short Term Goals - 03/06/16 [id number]    PT SHORT TERM GOAL #1   Title independent with HEP (03/28/16)   Status On-going   PT SHORT TERM GOAL #2   Title improve BERG balance score to >/= 44/56 for improved function and balance (03/28/16)   Status On-going   PT SHORT TERM GOAL #3   Title ambulate > 300' with LRAD on indoor/paved outdoor surfaces with supervision for improved function and mobility (03/28/16)   Status On-going           PT Long Term Goals - 03/14/16 0913    PT LONG TERM GOAL #1   Title verbalize understanding of posture/body mechanics to decrease risk of reinjury (04/25/16)   Status On-going   PT LONG TERM GOAL #2   Title improve BERG balance score to >/= 48/56 for improved function and mobility (04/25/16)   Status On-going  PT LONG TERM GOAL #3   Title improve timed up and go to < 13 sec for improved mobility and decreased fall risk (04/25/16)   Status On-going   PT LONG TERM GOAL #4   Title ambulate > 500' on various indoor/outdoor surfaces modified independent and LRAD for improved mobility and independence (04/25/16)   Status On-going               Plan - 03/18/16 0912    Clinical Impression Statement Pt easily distracted with difficulty maintaining attention to task.  Wife feels pt is walking more upright now.   PT Next Visit Plan lower ext strengthening/stretching. balance, gait   Consulted and Agree with Plan of Care Patient   Family Member Consulted wife        Problem List Patient Active Problem List   Diagnosis Date Noted  . Agitation 11/18/2014  . 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 Cognitive and behavioral changes 11/05/2014  . Altered mental status   . History of  migraine headaches   . Acute CVA (cerebrovascular accident) ([place]) 10/27/2014  . Aphasia   . Thoracic aortic aneurysm ([place]) 11/04/2013  . Aortic insufficiency 11/04/2013  . Obstructive sleep apnea 11/04/2013   Laureen Abrahams, PT, DPT 03/18/2016 9:17 AM  [facility] [address] [place], [place], [address] Phone: [phone]   Fax:  [phone]  Name: Christopher Atkinson MRN: [id number] Date of Birth: 10/28/1941

## 2016-03-20 ENCOUNTER — Ambulatory Visit: Payer: PPO | Admitting: Physical Therapy

## 2016-03-20 DIAGNOSIS — R269 Unspecified abnormalities of gait and mobility: Secondary | ICD-10-CM

## 2016-03-20 DIAGNOSIS — Z9181 History of falling: Secondary | ICD-10-CM

## 2016-03-20 DIAGNOSIS — R29898 Other symptoms and signs involving the musculoskeletal system: Secondary | ICD-10-CM

## 2016-03-20 DIAGNOSIS — R293 Abnormal posture: Secondary | ICD-10-CM

## 2016-03-20 NOTE — Therapy (Signed)
[facility] [address] [place], [place], [address] Phone: [phone]   Fax:  [phone]  Physical Therapy Treatment  Patient Details  Name: Christopher Atkinson MRN: [id number] Date of Birth: 27-Mar-1941 Referring Provider: Melvenia Beam  Encounter Date: 03/20/2016      PT End of Session - 03/20/16 0903    Visit Number 6   Number of Visits 17   Date for PT Re-Evaluation 04/25/16   PT Start Time 0825   PT Stop Time 0907   PT Time Calculation (min) 42 min   Equipment Utilized During Treatment Gait belt   Activity Tolerance Patient tolerated treatment well   Behavior During Therapy [facility] for tasks assessed/performed      Past Medical History  Diagnosis Date  . Thoracic aortic aneurysm ([place])   . Aortic insufficiency   . Obstructive sleep apnea     on CPAP  . Headache     Past Surgical History  Procedure Laterality Date  . Hernia repair    . Hernia repair    . Gallbladder surgery    . Hernia repair      There were no vitals filed for this visit.  Visit Diagnosis:  Abnormality of gait  Weakness of both lower extremities  Risk for falls  Abnormal posture      Subjective Assessment - 03/20/16 0828    Subjective doing fine today.    Patient Stated Goals improve walking and standing posture   Currently in Pain? No/denies              In // bars with min A and intermittent UE support: Rockerboard ant/post and laterally: weight shifting; horizontal/vertical head turns x 10 Standing on red balance beam: horizontal/vertical head turns; alt taps forward x 10; alt kicks forwards/backwards x 10  Single limb stance activities with min A: single tap and double tap to 8" cones   NuStep L6 x 8 min; 4 extremities with cues for big reciprocal movement  (Mod cues needed throughout for attention to task and reminders of current activity)                   PT Short Term Goals - 03/06/16  [id number]    PT SHORT TERM GOAL #1   Title independent with HEP (03/28/16)   Status On-going   PT SHORT TERM GOAL #2   Title improve BERG balance score to >/= 44/56 for improved function and balance (03/28/16)   Status On-going   PT SHORT TERM GOAL #3   Title ambulate > 300' with LRAD on indoor/paved outdoor surfaces with supervision for improved function and mobility (03/28/16)   Status On-going           PT Long Term Goals - 03/14/16 0913    PT LONG TERM GOAL #1   Title verbalize understanding of posture/body mechanics to decrease risk of reinjury (04/25/16)   Status On-going   PT LONG TERM GOAL #2   Title improve BERG balance score to >/= 48/56 for improved function and mobility (04/25/16)   Status On-going   PT LONG TERM GOAL #3   Title improve timed up and go to < 13 sec for improved mobility and decreased fall risk (04/25/16)   Status On-going   PT LONG TERM GOAL #4   Title ambulate > 500' on various indoor/outdoor surfaces modified independent and LRAD for improved mobility and independence (04/25/16)   Status On-going  Plan - 03/20/16 0903    Clinical Impression Statement Pt continues to need mod cues for attention to task; but demonstrated good hip strategy with rockerboard activity.  Will continue to benefit from PT to maximize function.   PT Next Visit Plan lower ext strengthening/stretching. balance, gait; begin checking STGs        Problem List Patient Active Problem List   Diagnosis Date Noted  . Agitation 11/18/2014  . 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 Cognitive and behavioral changes 11/05/2014  . Altered mental status   . History of migraine headaches   . Acute CVA (cerebrovascular accident) ([place]) 10/27/2014  . Aphasia   . Thoracic aortic aneurysm ([place]) 11/04/2013  . Aortic insufficiency 11/04/2013  . Obstructive sleep apnea 11/04/2013   Laureen Abrahams, PT, DPT 03/20/2016 9:11 AM  [facility] [address] [place], [place], [address] Phone: [phone]   Fax:  [phone]  Name: Christopher Atkinson MRN: [id number] Date of Birth: [birth year]

## 2016-03-26 ENCOUNTER — Ambulatory Visit: Payer: PPO | Attending: Neurology | Admitting: Physical Therapy

## 2016-03-26 DIAGNOSIS — R269 Unspecified abnormalities of gait and mobility: Secondary | ICD-10-CM | POA: Insufficient documentation

## 2016-03-26 DIAGNOSIS — R293 Abnormal posture: Secondary | ICD-10-CM | POA: Diagnosis not present

## 2016-03-26 DIAGNOSIS — Z9181 History of falling: Secondary | ICD-10-CM | POA: Diagnosis not present

## 2016-03-26 DIAGNOSIS — R2689 Other abnormalities of gait and mobility: Secondary | ICD-10-CM | POA: Diagnosis not present

## 2016-03-26 DIAGNOSIS — R29898 Other symptoms and signs involving the musculoskeletal system: Secondary | ICD-10-CM | POA: Insufficient documentation

## 2016-03-26 DIAGNOSIS — M6281 Muscle weakness (generalized): Secondary | ICD-10-CM | POA: Insufficient documentation

## 2016-03-26 NOTE — Therapy (Signed)
[place] [address] [place], [place], [address] Phone: [phone]   Fax:  [phone]  Physical Therapy Evaluation  Patient Details  Name: Christopher Atkinson MRN: [id number] Date of Birth: August 15, 1941 Referring Provider: Melvenia Beam  Encounter Date: 03/26/2016      PT End of Session - 03/26/16 0918    Visit Number 7   Number of Visits 17   Date for PT Re-Evaluation 04/25/16   PT Start Time 0830   PT Stop Time 0908   PT Time Calculation (min) 38 min   Equipment Utilized During Treatment Gait belt   Activity Tolerance Patient tolerated treatment well   Behavior During Therapy [facility] for tasks assessed/performed      Past Medical History  Diagnosis Date  . Thoracic aortic aneurysm ([place])   . Aortic insufficiency   . Obstructive sleep apnea     on CPAP  . Headache     Past Surgical History  Procedure Laterality Date  . Hernia repair    . Hernia repair    . Gallbladder surgery    . Hernia repair      There were no vitals filed for this visit.  Visit Diagnosis:  Abnormality of gait  Weakness of both lower extremities  Risk for falls  Abnormal posture      Subjective Assessment - 03/26/16 0832    Subjective doing well, no complaints   Currently in Pain? No/denies            [facility] PT Assessment - 03/26/16 0837    Ambulation/Gait   Ambulation/Gait Yes   Ambulation/Gait Assistance 5: Supervision   Ambulation Distance (Feet) 500 Feet   Assistive device None   Gait Pattern Trunk flexed   Ambulation Surface Level;Unlevel;Indoor;Outdoor;Paved;Grass;Gravel   Berg Balance Test   Sit to Stand Able to stand without using hands and stabilize independently   Standing Unsupported Able to stand safely 2 minutes   Sitting with Back Unsupported but Feet Supported on Floor or Stool Able to sit safely and securely 2 minutes   Stand to Sit Sits safely with minimal use of hands   Transfers Able to transfer  safely, minor use of hands   Standing Unsupported with Eyes Closed Able to stand 10 seconds safely   Standing Ubsupported with Feet Together Able to place feet together independently and stand 1 minute safely   From Standing, Reach Forward with Outstretched Arm Can reach forward >12 cm safely (5")   From Standing Position, Pick up Object from Floor Able to pick up shoe, needs supervision   From Standing Position, Turn to Look Behind Over each Shoulder Looks behind from both sides and weight shifts well   Turn 360 Degrees Able to turn 360 degrees safely but slowly   Standing Unsupported, Alternately Place Feet on Step/Stool Able to stand independently and safely and complete 8 steps in 20 seconds   Standing Unsupported, One Foot in Front Able to plae foot ahead of the other independently and hold 30 seconds   Standing on One Leg Able to lift leg independently and hold equal to or more than 3 seconds   Total Score 49                   [facility] Adult PT Treatment/Exercise - 03/26/16 0837    Self-Care   Self-Care Other Self-Care Comments   Other Self-Care Comments  fall prevention strategies  PT Education - 03/26/16 [phone]    Education provided Yes   Education Details fall prevention   Person(s) Educated Patient   Methods Explanation   Comprehension Verbalized understanding          PT Short Term Goals - 03/26/16 0919    PT SHORT TERM GOAL #1   Title independent with HEP (03/28/16)   Status On-going   PT SHORT TERM GOAL #2   Title improve BERG balance score to >/= 44/56 for improved function and balance (03/28/16)   Status Achieved   PT SHORT TERM GOAL #3   Title ambulate > 300' with LRAD on indoor/paved outdoor surfaces with supervision for improved function and mobility (03/28/16)   Status Achieved           PT Long Term Goals - 03/26/16 0920    PT LONG TERM GOAL #1   Title verbalize understanding of posture/body mechanics to decrease risk of  reinjury (04/25/16)   Status On-going   PT LONG TERM GOAL #2   Title improve BERG balance score to >/= 48/56 for improved function and mobility (04/25/16)   Status Achieved   PT LONG TERM GOAL #3   Title improve timed up and go to < 13 sec for improved mobility and decreased fall risk (04/25/16)   Status On-going   PT LONG TERM GOAL #4   Title ambulate > 500' on various indoor/outdoor surfaces modified independent and LRAD for improved mobility and independence (04/25/16)   Status On-going               Plan - 03/26/16 0918    Clinical Impression Statement Pt progressing well and is meeting goals (short term and long term).  Pt and wife ready for d/c at end of scheduled visits so anticipate d/c next 2 visits.     PT Next Visit Plan pt to bring OTAGO book to review; continue checking goals and plan for d/c         Problem List Patient Active Problem List   Diagnosis Date Noted  . Agitation 11/18/2014  . 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 Cognitive and behavioral changes 11/05/2014  . Altered mental status   . History of migraine headaches   . Acute CVA (cerebrovascular accident) ([place]) 10/27/2014  . Aphasia   . Thoracic aortic aneurysm ([place]) 11/04/2013  . Aortic insufficiency 11/04/2013  . Obstructive sleep apnea 11/04/2013   Laureen Abrahams, PT, DPT 03/26/2016 9:21 AM  [place] [address] [place], [place], [address] Phone: [phone]   Fax:  [phone]  Name: Deral Christians MRN: [id number] Date of Birth: [birth year]

## 2016-03-26 NOTE — Patient Instructions (Signed)

## 2016-03-29 ENCOUNTER — Ambulatory Visit: Payer: PPO | Admitting: Physical Therapy

## 2016-03-29 DIAGNOSIS — R2689 Other abnormalities of gait and mobility: Secondary | ICD-10-CM

## 2016-03-29 DIAGNOSIS — R293 Abnormal posture: Secondary | ICD-10-CM

## 2016-03-29 DIAGNOSIS — M6281 Muscle weakness (generalized): Secondary | ICD-10-CM

## 2016-03-29 NOTE — Addendum Note (Signed)
Addended by: Laureen Abrahams on: 03/29/2016 07:24 AM   Modules accepted: Orders

## 2016-03-29 NOTE — Therapy (Signed)
[place] [address] [place], [place], [address] Phone: [phone]   Fax:  [phone]  Physical Therapy Treatment  Patient Details  Name: Christopher Atkinson MRN: [id number] Date of Birth: [birth year] Referring Provider: Melvenia Beam  Encounter Date: 03/29/2016      PT End of Session - 03/29/16 0917    Visit Number 8   PT Start Time 0831   PT Stop Time 0913   PT Time Calculation (min) 42 min   Activity Tolerance Patient tolerated treatment well   Behavior During Therapy [facility] for tasks assessed/performed      Past Medical History  Diagnosis Date  . Thoracic aortic aneurysm ([place])   . Aortic insufficiency   . Obstructive sleep apnea     on CPAP  . Headache     Past Surgical History  Procedure Laterality Date  . Hernia repair    . Hernia repair    . Gallbladder surgery    . Hernia repair      There were no vitals filed for this visit.      Subjective Assessment - 03/29/16 0835    Subjective no complaints; doing well   Patient Stated Goals improve walking and standing posture   Currently in Pain? No/denies            [facility] PT Assessment - 03/29/16 0908    Ambulation/Gait   Ambulation/Gait Yes   Ambulation/Gait Assistance 5: Supervision   Ambulation Distance (Feet) 525 Feet   Assistive device None   Gait Pattern Trunk flexed   Ambulation Surface Level;Unlevel;Indoor;Outdoor;Gravel;Paved   Gait Comments modified independent on level and paved surfaces; supervision for compliant surfaces   Timed Up and Go Test   Normal TUG (seconds) 12.41                          Balance Exercises - 03/29/16 0835    OTAGO PROGRAM   Head Movements Standing;5 reps   Neck Movements Standing;5 reps   Back Extension Standing;5 reps   Trunk Movements Standing;5 reps   Ankle Movements Sitting;10 reps   Knee Extensor 10 reps;Weight (comment)  3   Knee Flexor 10 reps;Weight (comment)  3   Hip  ABductor 10 reps;Weight (comment)  3   Ankle Plantorflexors 20 reps, support   Ankle Dorsiflexors 20 reps, support   Knee Bends 10 reps, support   Backwards Walking Support   Walking and Turning Around No assistive device   Sideways Walking Assistive device   Tandem Stance 10 seconds, support   Tandem Walk Support   One Leg Stand 10 seconds, support   Heel Walking Support   Toe Walk Support   Sit to Stand 10 reps, no support           PT Education - 03/29/16 0917    Education provided Yes   Education Details posture, use of timers to remind about posture   Person(s) Educated Patient;Spouse   Methods Explanation;Demonstration   Comprehension Verbalized understanding          PT Short Term Goals - 03/29/16 0919    PT SHORT TERM GOAL #1   Title independent with HEP (03/28/16)   Status Achieved   PT SHORT TERM GOAL #2   Title improve BERG balance score to >/= 44/56 for improved function and balance (03/28/16)   Status Achieved   PT SHORT TERM GOAL #3   Title ambulate > 300' with LRAD on indoor/paved  outdoor surfaces with supervision for improved function and mobility (03/28/16)   Status Achieved           PT Long Term Goals - 04-28-2016 0919    PT LONG TERM GOAL #1   Title verbalize understanding of posture/body mechanics to decrease risk of reinjury (04/25/16)   Status Achieved   PT LONG TERM GOAL #2   Title improve BERG balance score to >/= 48/56 for improved function and mobility (04/25/16)   Status Achieved   PT LONG TERM GOAL #3   Title improve timed up and go to < 13 sec for improved mobility and decreased fall risk (04/25/16)   Status Achieved   PT LONG TERM GOAL #4   Title ambulate > 500' on various indoor/outdoor surfaces modified independent and LRAD for improved mobility and independence (04/25/16)   Baseline supervision without device on compliant surface; independent on paved and indoor surfaces   Status Partially Met               Plan - 2016/04/28 0918     Clinical Impression Statement Pt is ready for d/c today and has met almost all LTGs, continues to be supervision on compliant surfaces but independent on indoor and paved surfaces.   PT Next Visit Plan d/c PT today   Consulted and Agree with Plan of Care Patient   Family Member Consulted wife      Patient will benefit from skilled therapeutic intervention in order to improve the following deficits and impairments:     Visit Diagnosis: Other abnormalities of gait and mobility  Muscle weakness (generalized)  Abnormal posture       G-Codes - 2016/04/28 0920    Functional Assessment Tool Used BERG 49/56   Functional Limitation Mobility: Walking and moving around   Mobility: Walking and Moving Around Goal Status [phone]) At least 1 percent but less than 20 percent impaired, limited or restricted   Mobility: Walking and Moving Around Discharge Status [phone]) At least 1 percent but less than 20 percent impaired, limited or restricted      Problem List Patient Active Problem List   Diagnosis Date Noted  . Agitation 11/18/2014  . Complicated migraine 54/27/0623  . Cognitive and behavioral changes 11/05/2014  . Altered mental status   . History of migraine headaches   . Acute CVA (cerebrovascular accident) ([place]) 10/27/2014  . Aphasia   . Thoracic aortic aneurysm ([place]) 11/04/2013  . Aortic insufficiency 11/04/2013  . Obstructive sleep apnea 11/04/2013   Laureen Abrahams, PT, DPT Apr 28, 2016 9:21 AM  [place] [facility] [address] [place], [place], [address] Phone: [phone]   Fax:  [phone]  Name: Christopher Atkinson MRN: [id number] Date of Birth: 12-26-1940       PHYSICAL THERAPY DISCHARGE SUMMARY  Visits from Start of Care: 8  Current functional level related to goals / functional outcomes: See above   Remaining deficits: Pt continues to be supervision on compliant surfaces and needs frequent reminders for  posture due to cognition and decreased short term memory.   Education / Equipment: HEP  Plan: Patient agrees to discharge.  Patient goals were partially met. Patient is being discharged due to meeting the stated rehab goals.  ?????     Laureen Abrahams, PT, DPT 28-Apr-2016 9:22 AM  [facility] [address] [place], [place] [address]  [phone] (office) [phone] (fax)

## 2016-04-02 ENCOUNTER — Ambulatory Visit: Payer: Self-pay | Admitting: Physical Therapy

## 2016-04-10 ENCOUNTER — Ambulatory Visit: Payer: Self-pay | Admitting: Physical Therapy

## 2016-04-11 ENCOUNTER — Ambulatory Visit: Payer: Self-pay | Admitting: Physical Therapy

## 2016-04-15 ENCOUNTER — Ambulatory Visit: Payer: Self-pay | Admitting: Physical Therapy

## 2016-04-17 ENCOUNTER — Ambulatory Visit: Payer: Self-pay | Admitting: Physical Therapy

## 2016-04-23 ENCOUNTER — Ambulatory Visit: Payer: Self-pay | Admitting: Physical Therapy

## 2016-04-25 ENCOUNTER — Ambulatory Visit: Payer: Self-pay | Admitting: Physical Therapy

## 2016-04-30 ENCOUNTER — Ambulatory Visit (INDEPENDENT_AMBULATORY_CARE_PROVIDER_SITE_OTHER): Payer: PPO | Admitting: Neurology

## 2016-04-30 ENCOUNTER — Encounter: Payer: Self-pay | Admitting: Neurology

## 2016-04-30 VITALS — BP 133/74 | HR 49 | Ht 70.0 in | Wt 165.0 lb

## 2016-04-30 DIAGNOSIS — R251 Tremor, unspecified: Secondary | ICD-10-CM

## 2016-04-30 DIAGNOSIS — G43909 Migraine, unspecified, not intractable, without status migrainosus: Secondary | ICD-10-CM | POA: Diagnosis not present

## 2016-04-30 DIAGNOSIS — F03918 Unspecified dementia, unspecified severity, with other behavioral disturbance: Secondary | ICD-10-CM | POA: Insufficient documentation

## 2016-04-30 DIAGNOSIS — F0391 Unspecified dementia with behavioral disturbance: Secondary | ICD-10-CM | POA: Diagnosis not present

## 2016-04-30 DIAGNOSIS — G43109 Migraine with aura, not intractable, without status migrainosus: Secondary | ICD-10-CM

## 2016-04-30 MED ORDER — DIVALPROEX SODIUM ER 500 MG PO TB24: 500.0000 mg | ORAL_TABLET | Freq: Every day | ORAL | Status: DC
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
## 2016-04-30 NOTE — Patient Instructions (Signed)
Remember to drink plenty of fluid, eat healthy meals and do not skip any meals. Try to eat protein with a every meal and eat a healthy snack such as fruit or nuts in between meals. Try to keep a regular sleep-wake schedule and try to exercise daily, particularly in the form of walking, 20-30 minutes a day, if you can.   As far as your medications are concerned, I would like to suggest: Increase Depakote back to 500mg  at night  I would like to see you back in 6 months, sooner if we need to. Please call us with any interim questions, concerns, problems, updates or refill requests.   Our phone number is [phone]. We also have an after hours call service for urgent matters and there is a physician on-call for urgent questions. For any emergencies you know to call 911 or go to the nearest emergency room

## 2016-05-18 ENCOUNTER — Other Ambulatory Visit: Payer: Self-pay | Admitting: Neurology

## 2016-05-27 DIAGNOSIS — R42 Dizziness and giddiness: Secondary | ICD-10-CM | POA: Diagnosis not present

## 2016-05-27 DIAGNOSIS — I10 Essential (primary) hypertension: Secondary | ICD-10-CM | POA: Diagnosis not present

## 2016-05-27 DIAGNOSIS — G44209 Tension-type headache, unspecified, not intractable: Secondary | ICD-10-CM | POA: Diagnosis not present

## 2016-06-19 DIAGNOSIS — L719 Rosacea, unspecified: Secondary | ICD-10-CM | POA: Diagnosis not present

## 2016-08-20 ENCOUNTER — Other Ambulatory Visit: Payer: Self-pay | Admitting: Neurology

## 2016-08-27 DIAGNOSIS — F0390 Unspecified dementia without behavioral disturbance: Secondary | ICD-10-CM | POA: Diagnosis not present

## 2016-08-27 DIAGNOSIS — I10 Essential (primary) hypertension: Secondary | ICD-10-CM | POA: Diagnosis not present

## 2016-08-27 DIAGNOSIS — G43909 Migraine, unspecified, not intractable, without status migrainosus: Secondary | ICD-10-CM | POA: Diagnosis not present

## 2016-08-27 DIAGNOSIS — E782 Mixed hyperlipidemia: Secondary | ICD-10-CM | POA: Diagnosis not present

## 2016-09-10 ENCOUNTER — Encounter: Payer: Self-pay | Admitting: Neurology

## 2016-10-16 DIAGNOSIS — L719 Rosacea, unspecified: Secondary | ICD-10-CM | POA: Diagnosis not present

## 2016-10-16 DIAGNOSIS — H01019 Ulcerative blepharitis unspecified eye, unspecified eyelid: Secondary | ICD-10-CM | POA: Diagnosis not present

## 2016-11-05 ENCOUNTER — Ambulatory Visit: Payer: PPO | Admitting: Neurology

## 2016-11-06 ENCOUNTER — Encounter: Payer: Self-pay | Admitting: Neurology

## 2016-11-06 ENCOUNTER — Ambulatory Visit (INDEPENDENT_AMBULATORY_CARE_PROVIDER_SITE_OTHER): Payer: PPO | Admitting: Neurology

## 2016-11-06 VITALS — BP 122/68 | HR 48 | Ht 70.0 in | Wt 173.4 lb

## 2016-11-06 DIAGNOSIS — G43009 Migraine without aura, not intractable, without status migrainosus: Secondary | ICD-10-CM

## 2016-11-06 DIAGNOSIS — F0391 Unspecified dementia with behavioral disturbance: Secondary | ICD-10-CM

## 2016-11-06 MED ORDER — DONEPEZIL HCL 10 MG PO TABS: 10.0000 mg | ORAL_TABLET | Freq: Every day | ORAL | 5 refills | Status: DC

## 2016-11-06 MED ORDER — BUTALBITAL-APAP-CAFFEINE 50-325-40 MG PO TABS: ORAL_TABLET | ORAL | 5 refills | Status: AC

## 2016-11-06 MED ORDER — DIVALPROEX SODIUM ER 500 MG PO TB24: 500.0000 mg | ORAL_TABLET | Freq: Every day | ORAL | 5 refills | Status: DC

## 2016-11-06 MED ORDER — MEMANTINE HCL ER 28 MG PO CP24: 28.0000 mg | ORAL_CAPSULE | Freq: Every day | ORAL | 5 refills | Status: DC
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
## 2016-11-06 NOTE — Patient Instructions (Signed)
  Remember to drink plenty of fluid, eat healthy meals and do not skip any meals. Try to eat protein with a every meal and eat a healthy snack such as fruit or nuts in between meals. Try to keep a regular sleep-wake schedule and try to exercise daily, particularly in the form of walking, 20-30 minutes a day, if you can.   As far as your medications are concerned, I would like to suggest: continue current medications  I would like to see you back in 9 months, sooner if we need to. Please call us with any interim questions, concerns, problems, updates or refill requests.   Our phone number is [phone]. We also have an after hours call service for urgent matters and there is a physician on-call for urgent questions. For any emergencies you know to call 911 or go to the nearest emergency room

## 2016-12-11 ENCOUNTER — Telehealth: Payer: Self-pay | Admitting: Neurology

## 2016-12-11 NOTE — Telephone Encounter (Signed)
Christopher Atkinson, I think she can wait on the Namenda until the new year comes around and she can get the medication again, I don;t think stopping it for a few weeks will make a big difference. But if she wants two weeks that is fine please call it in thanks

## 2016-12-11 NOTE — Telephone Encounter (Signed)
Dr Ahern- please advise 

## 2016-12-11 NOTE — Telephone Encounter (Signed)
Pt's wife advised he has a new PCP at [place] and they will come tomorrow to sign medical release to send records to [place]. She also said he is out of memantine (NAMENDA XR) 28 MG CP24 24 hr capsule , he is in the donut hole and it is going to cost $165. She wants to know if maybe he could get a 2 wk supply sent to pharmacy. Pls call her

## 2016-12-12 DIAGNOSIS — D485 Neoplasm of uncertain behavior of skin: Secondary | ICD-10-CM | POA: Diagnosis not present

## 2016-12-12 DIAGNOSIS — L821 Other seborrheic keratosis: Secondary | ICD-10-CM | POA: Diagnosis not present

## 2016-12-12 DIAGNOSIS — Z08 Encounter for follow-up examination after completed treatment for malignant neoplasm: Secondary | ICD-10-CM | POA: Diagnosis not present

## 2016-12-12 DIAGNOSIS — L820 Inflamed seborrheic keratosis: Secondary | ICD-10-CM | POA: Diagnosis not present

## 2016-12-12 DIAGNOSIS — L570 Actinic keratosis: Secondary | ICD-10-CM | POA: Diagnosis not present

## 2016-12-12 DIAGNOSIS — Z85828 Personal history of other malignant neoplasm of skin: Secondary | ICD-10-CM | POA: Diagnosis not present

## 2016-12-12 NOTE — Telephone Encounter (Signed)
Pt returned Christopher Atkinson's call. She can be reached at [phone]

## 2016-12-12 NOTE — Telephone Encounter (Signed)
Christopher Atkinson called office back. She is agreeable to wait until the first of the year to get medication. [facility]  Advised that she will have to come to office to signed release form to have records sent to [facility] and we will fax everything. She verbalized understanding.

## 2016-12-12 NOTE — Telephone Encounter (Signed)
LVM returning call from wife. Gave [facility] phone number for call back.

## 2016-12-12 NOTE — Telephone Encounter (Signed)
Called and LVM for wife again. Gave [facility] phone number.

## 2017-01-15 ENCOUNTER — Encounter: Payer: Self-pay | Admitting: Cardiovascular Disease

## 2017-01-15 ENCOUNTER — Ambulatory Visit (INDEPENDENT_AMBULATORY_CARE_PROVIDER_SITE_OTHER): Payer: PPO | Admitting: Cardiovascular Disease

## 2017-01-15 VITALS — BP 110/56 | HR 57 | Ht 70.5 in | Wt 171.4 lb

## 2017-01-15 DIAGNOSIS — I712 Thoracic aortic aneurysm, without rupture, unspecified: Secondary | ICD-10-CM

## 2017-01-15 DIAGNOSIS — Z13220 Encounter for screening for lipoid disorders: Secondary | ICD-10-CM | POA: Diagnosis not present

## 2017-01-15 NOTE — Assessment & Plan Note (Signed)
Christopher Atkinson has a moderate sized thoracic aortic aneurysm last checked by CT angiogram 2 years ago at 5 cm. We will recheck a thoracic CTA

## 2017-01-15 NOTE — Patient Instructions (Signed)
Medication Instructions: Your physician recommends that you continue on your current medications as directed. Please refer to the Current Medication list given to you today.   Labwork: Your physician recommends that you return for a FASTING lipid profile and hepatic function panel.   Testing/Procedures: Non-Cardiac CT Angiography (CTA), is a special type of CT scan that uses a computer to produce multi-dimensional views of major blood vessels throughout the body. In CT angiography, a contrast material is injected through an IV to help visualize the blood vessels   Follow-Up: Your physician wants you to follow-up in: 1 year with Dr. Gwenlyn Found. You will receive a reminder letter in the mail two months in advance. If you don't receive a letter, please call our office to schedule the follow-up appointment.  If you need a refill on your cardiac medications before your next appointment, please call your pharmacy.

## 2017-01-15 NOTE — Progress Notes (Signed)
01/15/2017 Christopher Christopher Atkinson   01-19-1941  [id number]  Primary Physician [name], MD Primary Cardiologist: Lorretta Harp MD Renae Gloss  HPI:  Christopher Christopher Atkinson is a very pleasant 76 year old mildly overweight married Caucasian male father of 2, grandfather and 6 grandchildren who recently retired along with his wife 01/23/16. His cardiac risk factor profile is essentially benign. He was followed for thoracic aortic aneurysm by CT angiography annually, aortic insufficiency by 2-D echocardiography. He also has obstructive sleep apnea on CPAP. He denies chest pain or shortness of breath. His most recent lipid profile performed 10/28/14 revealed a total cholesterol of 128, LDL 79 and HDL of 35. His last CT angiogram performed 12/29/14 revealed a maximum dimension of 5 cm.   Current Outpatient Prescriptions  Medication Sig Dispense Refill  . aspirin 81 MG tablet Take 81 mg by mouth daily.    . butalbital-acetaminophen-caffeine (FIORICET, ESGIC) 50-325-40 MG tablet TAKE 1 TABLET BY MOUTH EVERY 6 HOURS AS NEEDED FOR HEADACHE( DO NOT TAKE MORE THAN 10 TABLETS PER MONTH) 10 tablet 5  . divalproex (DEPAKOTE ER) 500 MG 24 hr tablet Take 1 tablet (500 mg total) by mouth daily. 90 tablet 5  . donepezil (ARICEPT) 10 MG tablet Take 1 tablet (10 mg total) by mouth at bedtime. 90 tablet 5  . MAGNESIUM PO Take 1 tablet by mouth daily.    . Multiple Vitamins-Minerals (CVS SPECTRAVITE ADULT 50+ PO) Take 1 tablet by mouth daily.    . Omega-3 Fatty Acids (FISH OIL) 1000 MG CAPS Take 1 tablet by mouth 3 (three) times daily.     . Saw Palmetto 450 MG CAPS Take 1 capsule by mouth daily.    Marland Kitchen TAMSULOSIN HCL PO Take 10 mg by mouth daily.    Marland Kitchen tobramycin-dexamethasone (TOBRADEX) ophthalmic solution Place 1 drop into both eyes as needed.  0   No current facility-administered medications for this visit.     No Known Allergies  Social History   Social History  . Marital status: Married    Spouse name:  Christopher Christopher Atkinson   . Number of children: 2  . Years of education: 12   Occupational History  . Not on file.   Social History Main Topics  . Smoking status: Former Research scientist (life sciences)  . Smokeless tobacco: Never Used     Comment: quit smoking when he was 76 years old  . Alcohol use No  . Drug use: No  . Sexual activity: Not on file   Other Topics Concern  . Not on file   Social History Narrative   Patient lives at home with wife Christopher Christopher Atkinson    Patient as 2 children.    Patient is right handed.    Patient has a high school education.         Review of Systems: General: negative for chills, fever, night sweats or weight changes.  Cardiovascular: negative for chest pain, dyspnea on exertion, edema, orthopnea, palpitations, paroxysmal nocturnal dyspnea or shortness of breath Dermatological: negative for rash Respiratory: negative for cough or wheezing Urologic: negative for hematuria Abdominal: negative for nausea, vomiting, diarrhea, bright red blood per rectum, melena, or hematemesis Neurologic: negative for visual changes, syncope, or dizziness All other systems reviewed and are otherwise negative except as noted above.    Blood pressure (!) 110/56, pulse (!) 57, height 5' 10.5" (1.791 m), weight 171 lb 6.4 oz (77.7 kg).  General appearance: Christopher Atkinson and no distress Neck: no adenopathy, no carotid bruit, no JVD, supple, symmetrical,  trachea midline and thyroid not enlarged, symmetric, no tenderness/mass/nodules Lungs: clear to auscultation bilaterally Heart: regular rate and rhythm, S1, S2 normal, no murmur, click, rub or gallop Extremities: extremities normal, atraumatic, no cyanosis or edema  EKG sinus bradycardia at 57 with small inferior Q waves and occasional PVC. I personally reviewed this EKG.  ASSESSMENT AND PLAN:   Thoracic aortic aneurysm Christopher Christopher Atkinson has a moderate sized thoracic aortic aneurysm last checked by CT angiogram 2 years ago at 5 cm. We will recheck a thoracic CTA  Aortic  insufficiency Mild aortic insufficiency by 2-D echo last checked 10/28/14.      Lorretta Harp MD FACP,FACC,FAHA, [facility] 01/15/2017 1:45 PM

## 2017-01-15 NOTE — Addendum Note (Signed)
Addended by: Zebedee Iba on: 01/15/2017 02:30 PM   Modules accepted: Orders

## 2017-01-15 NOTE — Addendum Note (Signed)
Addended by: Therisa Doyne on: 01/15/2017 02:02 PM   Modules accepted: Orders

## 2017-01-15 NOTE — Assessment & Plan Note (Signed)
Mild aortic insufficiency by 2-D echo last checked 10/28/14.

## 2017-01-16 DIAGNOSIS — I712 Thoracic aortic aneurysm, without rupture: Secondary | ICD-10-CM | POA: Diagnosis not present

## 2017-01-16 LAB — LIPID PANEL
CHOLESTEROL: 133 mg/dL (ref <200)
HDL: 45 mg/dL (ref >40)
LDL Cholesterol: 76 mg/dL (ref <100)
Total CHOL/HDL Ratio: 3 Ratio (ref <5.0)
Triglycerides: 62 mg/dL (ref <150)
VLDL: 12 mg/dL (ref <30)

## 2017-01-16 LAB — HEPATIC FUNCTION PANEL
ALBUMIN: 3.6 g/dL (ref 3.6–5.1)
ALK PHOS: 65 U/L (ref 40–115)
ALT: 23 U/L (ref 9–46)
AST: 25 U/L (ref 10–35)
BILIRUBIN TOTAL: 1.1 mg/dL (ref 0.2–1.2)
Bilirubin, Direct: 0.3 mg/dL — ABNORMAL HIGH (ref <=0.2)
Indirect Bilirubin: 0.8 mg/dL (ref 0.2–1.2)
Total Protein: 6.6 g/dL (ref 6.1–8.1)

## 2017-01-16 LAB — BUN: BUN: 15 mg/dL (ref 7–25)

## 2017-01-16 LAB — CREATININE, SERUM: CREATININE: 0.6 mg/dL — AB (ref 0.70–1.18)

## 2017-01-30 ENCOUNTER — Ambulatory Visit (INDEPENDENT_AMBULATORY_CARE_PROVIDER_SITE_OTHER)
Admission: RE | Admit: 2017-01-30 | Discharge: 2017-01-30 | Disposition: A | Discharge status: Home / Self Care | Payer: PPO | Source: Ambulatory Visit | Attending: Cardiovascular Disease | Admitting: Cardiovascular Disease

## 2017-01-30 DIAGNOSIS — I712 Thoracic aortic aneurysm, without rupture, unspecified: Secondary | ICD-10-CM

## 2017-01-30 MED ORDER — IOPAMIDOL (ISOVUE-370) INJECTION 76%
100.0000 mL | Freq: Once | INTRAVENOUS | Status: AC | PRN
  Administered 2017-01-30: 100 mL via INTRAVENOUS

## 2017-03-14 ENCOUNTER — Telehealth: Payer: Self-pay | Admitting: Neurology

## 2017-03-14 DIAGNOSIS — W19XXXS Unspecified fall, sequela: Secondary | ICD-10-CM

## 2017-03-14 DIAGNOSIS — R269 Unspecified abnormalities of gait and mobility: Secondary | ICD-10-CM

## 2017-03-14 NOTE — Telephone Encounter (Signed)
Patients daughter Rollene Fare (listed on DPR) called office in reference to patient being really rigid following a fall.  Patient stumbling needing support to walk, unable to get in the bed by himself.  Rollene Fare would like to know if there is any type of herapy he can do to help with strengthen and flexibility.   Rollene Fare also requesting to see if she can get a prescription for a walker for her to bring to the [place].

## 2017-03-14 NOTE — Telephone Encounter (Signed)
Pt last seen in Nov for dementia and migraine. He has a follow-up scheduled in August.

## 2017-03-14 NOTE — Telephone Encounter (Signed)
Called daughter and let her know that PT was reordered for pt and the therapist can also evaluate pt for a walker. Dtr mentioned that pt did not remember falling last night, seemed more confused and having increased difficulty w/ walking.  Said that somehow his [place] was not refilled and was stopped 5-6 months ago. [facility] did not [facility] to [facility] but dtr requests new printed rx to be filled at the [place]. He has continued donepezil. Family did speak to [place] doctor who ordered antibx for possible UTI and they are going to pick up med now. [facility] told them if he didn't seem better over the weekend that he could be seen on Monday for OV.

## 2017-03-14 NOTE — Addendum Note (Signed)
Addended by: Monte Fantasia on: 03/14/2017 02:05 PM   Modules accepted: Orders

## 2017-03-14 NOTE — Telephone Encounter (Signed)
Yes please order thank yoU!

## 2017-03-16 ENCOUNTER — Other Ambulatory Visit: Payer: Self-pay | Admitting: Neurology

## 2017-03-16 MED ORDER — MEMANTINE HCL ER 28 MG PO CP24: 28.0000 mg | ORAL_CAPSULE | Freq: Every day | ORAL | 5 refills | Status: DC

## 2017-03-16 MED ORDER — MEMANTINE HCL 28 X 5 MG & 21 X 10 MG PO TABS: ORAL_TABLET | ORAL | 12 refills | Status: DC

## 2017-03-16 NOTE — Telephone Encounter (Signed)
Christopher Atkinson - I ordered a Namenda titration packet. When they are done with that they can switch to the 28mg  daily ER. I placed the orders can you print them tomorrow and fax them where they need to go? Thanks!

## 2017-03-17 NOTE — Telephone Encounter (Signed)
Returned call to pt's daughter. Reports that he is much better since starting antibx. Informed her that new rx for Namenda has been placed up front for pick-up. Verbalized understanding and appreciation for call.

## 2017-03-18 ENCOUNTER — Ambulatory Visit: Payer: PPO | Admitting: Physical Therapy

## 2017-03-24 ENCOUNTER — Ambulatory Visit: Payer: PPO | Attending: Neurology | Admitting: Physical Therapy

## 2017-03-24 DIAGNOSIS — R2689 Other abnormalities of gait and mobility: Secondary | ICD-10-CM | POA: Insufficient documentation

## 2017-03-24 DIAGNOSIS — M6281 Muscle weakness (generalized): Secondary | ICD-10-CM | POA: Diagnosis not present

## 2017-03-24 DIAGNOSIS — R296 Repeated falls: Secondary | ICD-10-CM | POA: Insufficient documentation

## 2017-03-24 DIAGNOSIS — R2681 Unsteadiness on feet: Secondary | ICD-10-CM

## 2017-03-24 NOTE — Therapy (Signed)
[facility] [address]  [place], [place], [address] Phone: [phone]   Fax:  [phone]  Physical Therapy Evaluation  Patient Details  Name: Christopher Atkinson MRN: [id number] Date of Birth: Feb 02, 1941 Referring Provider: Dr. Balinda Quails  Encounter Date: 03/24/2017      PT End of Session - 03/24/17 1748    Visit Number 1   Number of Visits 16   Date for PT Re-Evaluation 05/19/17   PT Start Time 3149   PT Stop Time 1356   PT Time Calculation (min) 48 min   Activity Tolerance Patient tolerated treatment well   Behavior During Therapy [facility] for tasks assessed/performed      Past Medical History:  Diagnosis Date  . Aortic insufficiency   . Headache   . Obstructive sleep apnea    on CPAP  . Thoracic aortic aneurysm [facility])     Past Surgical History:  Procedure Laterality Date  . GALLBLADDER SURGERY    . HERNIA REPAIR    . HERNIA REPAIR    . HERNIA REPAIR      There were no vitals filed for this visit.       Subjective Assessment - 03/24/17 1309    Subjective Patient of Dr. Cathren Laine - patients wife reports patient is very stiff - need some strengthening and help walking. Does have a history of falls - most recent last week - slide down edge of bed, unable to get up, had to have help from daughter. No serious injuries resulting from the falls. Does reports cramping of B LE; denies N&T into LE. Does report some lightheadedness from time to time - unknown reasons.    Patient is accompained by: Family member   Pertinent History aortic valve anuerysm   Limitations Walking   Patient Stated Goals improve strength, walk better   Currently in Pain? No/denies   Multiple Pain Sites No            [facility] PT Assessment - 03/24/17 1315      Assessment   Medical Diagnosis Gait abnormality   Referring Provider Dr. Balinda Quails   Next MD Visit --  August 2018   Prior Therapy yes - spring 2017     Precautions   Precautions None     Restrictions   Weight Bearing Restrictions No     Balance Screen   Has the patient fallen in the past 6 months Yes   How many times? 3   Has the patient had a decrease in activity level because of a fear of falling?  No   Is the patient reluctant to leave their home because of a fear of falling?  No     Home Environment   Living Environment Private residence   Living Arrangements Spouse/significant other   Type of [place] to enter   Entrance Stairs-Number of Steps 5   Entrance Stairs-Rails None   Home Layout One level   [place] - single point     Prior Function   Level of [place] Retired     Associate Professor   Overall Cognitive Status Within Functional Limits for tasks assessed     Observation/Other Assessments   Focus on Therapeutic Outcomes (FOTO)  Neuro: 43 (57% limited, predicted 42% limited)     Sensation   Light Touch Appears Intact     Coordination   Gross Motor Movements are Fluid and Coordinated  Yes     ROM / Strength   AROM / PROM / Strength AROM;Strength     AROM   Overall AROM  Within functional limits for tasks performed     Strength   Strength Assessment Site Hip;Knee;Ankle   Right/Left Hip Right;Left   Right Hip Flexion 4-/5   Left Hip Flexion 4-/5   Right/Left Knee Right;Left   Right Knee Flexion 4/5   Right Knee Extension 4+/5   Left Knee Flexion 4/5   Left Knee Extension 4+/5   Right/Left Ankle Right;Left   Right Ankle Dorsiflexion 4+/5   Left Ankle Dorsiflexion 4+/5     Flexibility   Soft Tissue Assessment /Muscle Length no  suspected tight hip flexors and HS due to preferred position     Ambulation/Gait   Ambulation/Gait Yes   Ambulation/Gait Assistance 7: Independent   Ambulation Distance (Feet) 200 Feet   Assistive device None   Gait Pattern Decreased stride length;Shuffle;Trunk flexed   Ambulation Surface Level;Indoor   Gait velocity 2.76 feet/second    Gait Comments some freezing of gait with turns as well as with manual and cognitive tasks     Balance   Balance Assessed Yes     Standardized Balance Assessment   Standardized Balance Assessment Berg Balance Test;Timed Up and Go Test;Five Times Sit to Stand   Five times sit to stand comments  34.44     Berg Balance Test   Sit to Stand Able to stand  independently using hands   Standing Unsupported Able to stand safely 2 minutes   Sitting with Back Unsupported but Feet Supported on Floor or Stool Able to sit safely and securely 2 minutes   Stand to Sit Controls descent by using hands   Transfers Able to transfer safely, definite need of hands   Standing Unsupported with Eyes Closed Able to stand 10 seconds with supervision   Standing Ubsupported with Feet Together Able to place feet together independently and stand for 1 minute with supervision   From Standing, Reach Forward with Outstretched Arm Can reach forward >5 cm safely (2")   From Standing Position, Pick up Object from Floor Unable to pick up and needs supervision   From Standing Position, Turn to Look Behind Over each Shoulder Needs supervision when turning   Turn 360 Degrees Needs close supervision or verbal cueing   Standing Unsupported, Alternately Place Feet on Step/Stool Needs assistance to keep from falling or unable to try   Standing Unsupported, One Foot in Front Able to take small step independently and hold 30 seconds   Standing on One Leg Tries to lift leg/unable to hold 3 seconds but remains standing independently   Total Score 31     Timed Up and Go Test   Normal TUG (seconds) 18.72   Manual TUG (seconds) 23.94   Cognitive TUG (seconds) 34.75                           PT Education - 03/24/17 1747    Education provided Yes   Education Details exam findings, POC, HEP   Person(s) Educated Patient   Methods Explanation;Demonstration;Handout   Comprehension Verbalized understanding;Returned  demonstration;Need further instruction          PT Short Term Goals - 03/24/17 1757      PT SHORT TERM GOAL #1   Title Patient to be independent with initial HEP (04/21/16)   Status New     PT SHORT  TERM GOAL #2   Title Patient to improve Ber Balance score to >/=40/56 demonstrating improved functional mobility and balance (04/21/17)   Status New           PT Long Term Goals - 03/24/17 1758      PT LONG TERM GOAL #1   Title Patient to be independent with advanced HEP (05/19/17)   Status New     PT LONG TERM GOAL #2   Title Patient to improve Berg Balance score to >/= 51/56 demonstrating improved functional mobility and balance (05/19/17)   Status New     PT LONG TERM GOAL #3   Title Patient to improve TUG to <13 seconds demonstrating reduced fall risk (05/19/17)   Status New     PT LONG TERM GOAL #4   Title Patient to improve 5 times sit to stand by >/= 10 seconds (05/19/17)   Status New     PT LONG TERM GOAL #5   Title Patient to ambulate > 500 feet on various indoor/outdoor surfaces with Mod I with no LOB and good heel toe gait pattern demonstrating improved funcitonal mobility and overall balance (05/19/17)   Status New               Plan - 03/24/17 1748    Clinical Impression Statement Patient is a 76 y/o male presenting to [facility] today with his wife with primary concerns for general balance and weakness. Patient reporting a history of falls - most recent last week, requiring assistance. Patient today demonstrating a shuffling gait patterns with slight evidence of freezing of gait during turns as well as with cognitive or manual tasks such as carrying cup of water. Patient also with poor standing posture, preferring forward flexion, some decreased strength of B LE, as well as poor balance as evidenced from [employer] and 5 times sit to stand. Patient to benefit from skilled PT to address the above lsited deficits to allow for improved functional mobility with reduced  fall risk.    Rehab Potential Good   PT Frequency 2x / week   PT Duration 8 weeks   PT Treatment/Interventions ADLs/Self Care Home Management;Cryotherapy;Electrical Stimulation;Moist Heat;Ultrasound;Neuromuscular re-education;Balance training;Therapeutic exercise;Therapeutic activities;Functional mobility training;Stair training;Gait training;Patient/family education;Manual techniques;Vasopneumatic Device;Taping   PT Next Visit Plan possible incorporation of PWR! or OTAGO, general balance, strength   Consulted and Agree with Plan of Care Patient      Patient will benefit from skilled therapeutic intervention in order to improve the following deficits and impairments:  Abnormal gait, Decreased activity tolerance, Decreased balance, Decreased mobility, Decreased strength, Difficulty walking  Visit Diagnosis: Other abnormalities of gait and mobility - Plan: PT plan of care cert/re-cert  Unsteadiness on feet - Plan: PT plan of care cert/re-cert  Repeated falls - Plan: PT plan of care cert/re-cert  Muscle weakness (generalized) - Plan: PT plan of care cert/re-cert      G-Codes - 51/02/58 1804    Functional Assessment Tool Used (Outpatient Only) FOTO: 43 (57% limited)   Functional Limitation Mobility: Walking and moving around   Mobility: Walking and Moving Around Current Status ([id number]) At least 40 percent but less than 60 percent impaired, limited or restricted   Mobility: Walking and Moving Around Goal Status [phone]) At least 20 percent but less than 40 percent impaired, limited or restricted       Problem List Patient Active Problem List   Diagnosis Date Noted  . 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 Agitation 11/18/2014  . Complicated  migraine 11/05/2014  . Cognitive and behavioral changes 11/05/2014  . Altered mental status   . History of migraine headaches   . Acute CVA (cerebrovascular accident) ([place]) 10/27/2014  . Aphasia   . Thoracic aortic aneurysm ([place])  11/04/2013  . Aortic insufficiency 11/04/2013  . Obstructive sleep apnea 11/04/2013     Lanney Gins, PT, DPT 03/24/17 6:06 PM   [facility] [address]  [place], [place], [address] Phone: [phone]   Fax:  [phone]  Name: Christopher Atkinson MRN: [id number] Date of Birth: [birth year]

## 2017-03-24 NOTE — Patient Instructions (Signed)
Sit to Stand / Stand to Sit / Transfers    Sit on edge of a solid chair with arms, feet flat on floor. Lean forward over feet and stand up with hands on chair arms. Sit down slowly with hands on chair arms. Repeat __15__ times per session. Do _2___ sessions per day.   Hip (Front)    Begin sitting tall, both feet flat on floor. Inhale, then exhale while lifting knee as high as is comfortable, keeping upper body straight and still. Slowly return to starting position. Repeat __15__ times each leg. Do __2__ sets per session.     Long [employer]    Straighten operated leg and try to hold it _5___ seconds.  Repeat _15___ times. Do _2___ sessions a day.

## 2017-03-26 ENCOUNTER — Telehealth: Payer: Self-pay | Admitting: Behavioral Health

## 2017-03-26 ENCOUNTER — Encounter: Payer: Self-pay | Admitting: Behavioral Health

## 2017-03-26 NOTE — Telephone Encounter (Signed)
Pre-Visit Call completed with patient and chart updated.   Pre-Visit Info documented in Specialty Comments under [employer].    

## 2017-03-27 ENCOUNTER — Ambulatory Visit (INDEPENDENT_AMBULATORY_CARE_PROVIDER_SITE_OTHER): Payer: PPO | Admitting: Family Medicine

## 2017-03-27 VITALS — BP 116/72 | HR 55 | Temp 97.6°F | Ht 70.0 in | Wt 161.8 lb

## 2017-03-27 DIAGNOSIS — F0391 Unspecified dementia with behavioral disturbance: Secondary | ICD-10-CM

## 2017-03-27 DIAGNOSIS — R350 Frequency of micturition: Secondary | ICD-10-CM

## 2017-03-27 LAB — POCT URINALYSIS DIP (MANUAL ENTRY)
BILIRUBIN UA: NEGATIVE
Glucose, UA: NEGATIVE
Ketones, POC UA: NEGATIVE
LEUKOCYTES UA: NEGATIVE
Nitrite, UA: NEGATIVE
RBC UA: NEGATIVE
Spec Grav, UA: 1.03 (ref 1.030–1.035)
Urobilinogen, UA: >=8 — AB (ref ?–2.0)
pH, UA: 6 (ref 5.0–8.0)

## 2017-03-27 NOTE — Progress Notes (Signed)
Pre visit review using our clinic review tool, if applicable. No additional management support is needed unless otherwise documented below in the visit note. 

## 2017-03-27 NOTE — Patient Instructions (Addendum)
It was a pleasure to meet you and your wife today!  I will look forward to taking care of you We are going to send you urine for a culture - your urine dipstick does not look convincing for an infection so I would like to confirm an infection before we start treating you

## 2017-03-27 NOTE — Progress Notes (Signed)
Christopher Atkinson at [facility] [address] [place], [place] [address] [phone]  Date:  03/27/2017   Name:  Christopher Atkinson   DOB:  1941/07/18   MRN:  [id number]  PCP:  Christopher Blinks, MD    Chief Complaint: Establish Care (Pt here to est care. c/o feeling tired all the time, doesn't sleep well, constant runny nose, headaches and migraines, lower back pain on the right side more than the left. Recently finished antibiotics. Slow urine stream)   History of Present Illness:  Christopher Atkinson is a 76 y.o. very pleasant male patient who presents with the following:  History of dementia, tremor, headaches, aortic insuf, OSA.  He also has a thoracic AA that is at the cusp of needing repair but they have hesitated to pursue this due to his other health issues  Pt of Dr Christopher Atkinson - neurology Christopher Atkinson- cardiology  They had been patients of Dr. Rex Atkinson in [place] but they are transferring here due to more convenient location They have been married for 79 years- they have 2 children, 6 grandchildren, and 3 great grands.  Their daughter lives down the street and their son is also nearly.    Christopher Atkinson reports that he worked in several different careers, and was also in [employer] in his youth.   He has noted some concerns about urination- he notes that he has to urinate frequently, and he may have accidents (urge incont)- he has noted this for several months if not longer.  About 2 weeks ago he was having more severe urinary sx in addition to malaise and chills. [facility] and [facility]- however he did not have a urine culture at that time. The abx seemed to help but now his sx (urinary only- not systemic) have worsened again b  They think he is all UTD on his immunization through the [place] in [place]. [facility] use [facility] of his [facility]  He is on flomax at this time  He did see a urologist at some point but it has been a long time   They are not sure if he was ever told that his prostate was enlarged He is still taking tamsulosin for his urine stream He does not wear any pads or pull-ups for incont  Christopher Atkinson has significant dementia and defers to his wife to give most of the history. He is alert and cooperative, pleasant Patient Active Problem List   Diagnosis Date Noted  . 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 Agitation 11/18/2014  . Complicated migraine 85/46/2703  . Cognitive and behavioral changes 11/05/2014  . Altered mental status   . History of migraine headaches   . Acute CVA (cerebrovascular accident) ([place]) 10/27/2014  . Aphasia   . Thoracic aortic aneurysm ([place]) 11/04/2013  . Aortic insufficiency 11/04/2013  . Obstructive sleep apnea 11/04/2013    Past Medical History:  Diagnosis Date  . Aortic insufficiency   . Headache   . Obstructive sleep apnea    on CPAP  . Thoracic aortic aneurysm [facility])     Past Surgical History:  Procedure Laterality Date  . CHOLECYSTECTOMY    . GALLBLADDER SURGERY    . HERNIA REPAIR    . HERNIA REPAIR    . HERNIA REPAIR      Social History  Substance Use Topics  . Smoking status: Former Research scientist (life sciences)  . Smokeless tobacco: Never Used  Comment: quit smoking when he was 76 years old  . Alcohol use No    Family History  Problem Relation Age of Onset  . Heart Problems Father     CHF  . Dementia Mother   . Stroke Brother   . Dementia Brother   . Parkinson's disease Brother   . Alzheimer's disease Brother   . Cancer Brother     Throat cancer    No Known Allergies  Medication list has been reviewed and updated.  Current Outpatient Prescriptions on File Prior to Visit  Medication Sig Dispense Refill  . aspirin 81 MG tablet Take 81 mg by mouth daily.    . butalbital-acetaminophen-caffeine (FIORICET, ESGIC) 50-325-40 MG tablet TAKE 1 TABLET BY MOUTH EVERY 6 HOURS AS NEEDED FOR HEADACHE( DO NOT TAKE MORE THAN 10 TABLETS PER MONTH) 10 tablet 5  .  divalproex (DEPAKOTE ER) 500 MG 24 hr tablet Take 1 tablet (500 mg total) by mouth daily. 90 tablet 5  . donepezil (ARICEPT) 10 MG tablet Take 1 tablet (10 mg total) by mouth at bedtime. 90 tablet 5  . MAGNESIUM PO Take 1 tablet by mouth daily.    . memantine (NAMENDA TITRATION PAK) tablet pack 5 mg/day for =1 week; 5 mg twice daily for =1 week; 15 mg/day given in 5 mg and 10 mg separated doses for =1 week; then 10 mg twice daily 49 tablet 12  . memantine (NAMENDA XR) 28 MG CP24 24 hr capsule Take 1 capsule (28 mg total) by mouth daily. 30 capsule 5  . Multiple Vitamins-Minerals ([employer]S SPECTRAVITE ADULT 50+ PO) Take 1 tablet by mouth daily.    . Omega-3 Fatty Acids (FISH OIL) 1000 MG CAPS Take 1 tablet by mouth 3 (three) times daily.     . Saw Palmetto 450 MG CAPS Take 1 capsule by mouth daily.    Marland Kitchen TAMSULOSIN HCL PO Take 10 mg by mouth daily.    Marland Kitchen tobramycin-dexamethasone (TOBRADEX) ophthalmic solution Place 1 drop into both eyes as needed.  0   No current facility-administered medications on file prior to visit.     Review of Systems:  As per HPI- otherwise negative. He is eating normally, no belly pain, nausea, vomiting or diarrhea  BP Readings from Last 3 Encounters:  03/27/17 116/72  01/15/17 (!) 110/56  11/06/16 122/68   Pulse Readings from Last 3 Encounters:  03/27/17 (!) 55  01/15/17 (!) 57  11/06/16 (!) 48      Physical Examination: Vitals:   03/27/17 1258  BP: 116/72  Pulse: (!) 55  Temp: 97.6 F (36.4 C)   Vitals:   03/27/17 1258  Weight: 161 lb 12.8 oz (73.4 kg)  Height: 5\' 10"  (1.778 m)   Body mass index is 23.22 kg/m. Ideal Body Weight: Weight in (lb) to have BMI = 25: 173.9  GEN: WDWN, NAD, Non-toxic, A & O x 3, looks well HEENT: Atraumatic, Normocephalic. Neck supple. No masses, No LAD. Ears and Nose: No external deformity. CV: RRR, No M/G/R. No JVD. No thrill. No extra heart sounds. PULM: CTA B, no wheezes, crackles, rhonchi. No retractions. No  resp. distress. No accessory muscle use. ABD: S, NT, ND, +BS. No rebound. No HSM.  Benign belly EXTR: No c/c/e NEURO Normal gait.  PSYCH: Normally interactive. Conversant. Not depressed or anxious appearing.  Calm demeanor.  GU: normal exam of genitals.  uncirc, no evidence of ballanitis   Results for orders placed or performed in visit on 03/27/17  POCT  urinalysis dipstick  Result Value Ref Range   Color, UA yellow yellow   Clarity, UA clear clear   Glucose, UA negative negative   Bilirubin, UA negative negative   Ketones, POC UA negative negative   Spec Grav, UA 1.030 1.030 - 1.035   Blood, UA negative negative   pH, UA 6.0 5.0 - 8.0   Protein Ur, POC trace (A) negative   Urobilinogen, UA >=8.0 (A) Negative - 2.0   Nitrite, UA Negative Negative   Leukocytes, UA Negative Negative    Assessment and Plan: Urinary frequency - Plan: POCT urinalysis dipstick, Urine culture  Dementia with behavioral disturbance, unspecified dementia type  Here today as a new patient to establish care He is also treated by cardiology, neurology and the [facility] Their main concern for me today is his urinary sx- he has frequent urge to urinate and may leak at times.  He had a likely UTI which was recently treated with abx by the [place], and no urine culture taken. Will culture urine today- as his dip is benign will await this result prior to starting him on abx  Will plan further follow- up pending labs.   Signed Christopher Blinks, MD

## 2017-03-28 ENCOUNTER — Ambulatory Visit: Payer: PPO | Admitting: Physical Therapy

## 2017-03-28 DIAGNOSIS — R296 Repeated falls: Secondary | ICD-10-CM

## 2017-03-28 DIAGNOSIS — R2689 Other abnormalities of gait and mobility: Secondary | ICD-10-CM | POA: Diagnosis not present

## 2017-03-28 DIAGNOSIS — R2681 Unsteadiness on feet: Secondary | ICD-10-CM

## 2017-03-28 DIAGNOSIS — M6281 Muscle weakness (generalized): Secondary | ICD-10-CM

## 2017-03-28 LAB — URINE CULTURE: Organism ID, Bacteria: NO GROWTH

## 2017-03-28 NOTE — Therapy (Signed)
[facility] [address]  [place], [place], [address] Phone: [phone]   Fax:  [phone]  Physical Therapy Treatment  Patient Details  Name: Christopher Atkinson MRN: [id number] Date of Birth: [birth year] Referring Provider: Dr. Balinda Quails  Encounter Date: 03/28/2017      PT End of Session - 03/28/17 1101    Visit Number 2   Number of Visits 16   Date for PT Re-Evaluation 05/19/17   PT Start Time 1101   PT Stop Time 1148   PT Time Calculation (min) 47 min   Activity Tolerance Patient tolerated treatment well   Behavior During Therapy [facility] for tasks assessed/performed      Past Medical History:  Diagnosis Date  . Aortic insufficiency   . Headache   . Obstructive sleep apnea    on CPAP  . Thoracic aortic aneurysm [facility])     Past Surgical History:  Procedure Laterality Date  . CHOLECYSTECTOMY    . GALLBLADDER SURGERY    . HERNIA REPAIR    . HERNIA REPAIR    . HERNIA REPAIR      There were no vitals filed for this visit.      Subjective Assessment - 03/28/17 1105    Subjective Pt noting R flank pain today - states he's had it for 6 months. Thinks it may be related to his kidney.   Patient is accompained by: Family member   Pertinent History aortic valve anuerysm   Patient Stated Goals improve strength, walk better   Currently in Pain? Yes   Pain Score --  pt unable to rate   Pain Location Back   Pain Orientation Right;Lower   Pain Type Chronic pain   Pain Radiating Towards across low back and into R groin   Pain Onset More than a month ago  6 months   Pain Frequency Intermittent   Aggravating Factors  getting in/out of car, prolonged standing or walking   Pain Relieving Factors "straighten up"   Effect of Pain on Daily Activities limited tolerance for standing to wash dishes, limited shopping tolerance            [facility] PT Assessment - 03/28/17 1101      Flexibility   Soft Tissue Assessment  /Muscle Length yes   Hamstrings mod/severely tight R>L   Quadriceps mod/severely tight hip flexors & quad R>L   Piriformis severely tight R>L                     [facility] Adult PT Treatment/Exercise - 03/28/17 1101      Knee/Hip Exercises: Stretches   Passive Hamstring Stretch Both;30 seconds;2 reps   Passive Hamstring Stretch Limitations manual by PT   Hip Flexor Stretch Right;30 seconds;2 reps   Hip Flexor Stretch Limitations manual by PT; 2nd rep - mod thomas position for HEP   Piriformis Stretch Both;30 seconds;2 reps   Piriformis Stretch Limitations figure 4 with overpressure & KTOS     Knee/Hip Exercises: Aerobic   Nustep lvl 4 x 6'     Knee/Hip Exercises: Seated   Long Arc Quad Both;15 reps;Weights   Long Arc Quad Weight 2 lbs.   Marching Both;15 reps;Weights   Marching Weights 2 lbs.   Sit to Sand 10 reps;with UE support  emphasis on full extension & controlled descent           [facility]) - 03/28/17 1101    PWR! exercises Moves in  sitting   PWR! Up x10   PWR! Rock x10   PWR! Twist x10   PWR! Step x10             PT Education - 03/28/17 1148    Education provided Yes   Education Details Addtions to HEP - hip stretches & seated PWR! Moves   Person(s) Educated Patient;Spouse   Methods Explanation;Demonstration;Handout;Verbal cues;Tactile cues   Comprehension Verbalized understanding;Returned demonstration;Verbal cues required;Tactile cues required;Need further instruction          PT Short Term Goals - 03/28/17 1200      PT SHORT TERM GOAL #1   Title Patient to be independent with initial HEP (04/21/16)   Status On-going     PT SHORT TERM GOAL #2   Title Patient to improve Ber Balance score to >/=40/56 demonstrating improved functional mobility and balance (04/21/17)   Status On-going           PT Long Term Goals - 03/28/17 1201      PT LONG TERM GOAL #1   Title Patient to be independent with advanced HEP (05/19/17)   Status  On-going     PT LONG TERM GOAL #2   Title Patient to improve Berg Balance score to >/= 51/56 demonstrating improved functional mobility and balance (05/19/17)   Status On-going     PT LONG TERM GOAL #3   Title Patient to improve TUG to <13 seconds demonstrating reduced fall risk (05/19/17)   Status On-going     PT LONG TERM GOAL #4   Title Patient to improve 5 times sit to stand by >/= 10 seconds (05/19/17)   Status On-going     PT LONG TERM GOAL #5   Title Patient to ambulate > 500 feet on various indoor/outdoor surfaces with Mod I with no LOB and good heel toe gait pattern demonstrating improved funcitonal mobility and overall balance (05/19/17)   Status On-going               Plan - 03/28/17 1148    Clinical Impression Statement Pt able to perform return demonstration of HEP with minimal cues for posture, pacing and hold times. Pt demonstrating tendency to bounce with terminal knee extension during LAQ, therefore cued pt to try for 5 sec hold at full extension to promote active HS stretch. Flexibility assessment revealed marked tightness in majority of hip musculature, esp hip flexors, piriformis & hamstrings, therefore stretches added to HEP. Introduced seated PWR! Moves with pt having some difficulty coordinating movement, but instructions provided to pt and wife to allow them to practice at home.   Rehab Potential Good   PT Treatment/Interventions ADLs/Self Care Home Management;Cryotherapy;Electrical Stimulation;Moist Heat;Ultrasound;Neuromuscular re-education;Balance training;Therapeutic exercise;Therapeutic activities;Functional mobility training;Stair training;Gait training;Patient/family education;Manual techniques;Vasopneumatic Device;Taping   PT Next Visit Plan LE stretching; general strengthening & balance with possible incorporation of OTAGO; review seated PWR! Moves and progress to other positions as tolerated   Consulted and Agree with Plan of Care Patient;Family  member/caregiver   Family Member Consulted wife      Patient will benefit from skilled therapeutic intervention in order to improve the following deficits and impairments:  Abnormal gait, Decreased activity tolerance, Decreased balance, Decreased mobility, Decreased strength, Difficulty walking  Visit Diagnosis: Other abnormalities of gait and mobility  Unsteadiness on feet  Repeated falls  Muscle weakness (generalized)     Problem List Patient Active Problem List   Diagnosis Date Noted  . 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 Agitation 11/18/2014  .  Complicated migraine 85/50/1586  . Cognitive and behavioral changes 11/05/2014  . Altered mental status   . History of migraine headaches   . Acute CVA (cerebrovascular accident) ([place]) 10/27/2014  . Aphasia   . Thoracic aortic aneurysm ([place]) 11/04/2013  . Aortic insufficiency 11/04/2013  . Obstructive sleep apnea 11/04/2013    Percival Spanish, PT, MPT 03/28/2017, 12:12 PM  [facility] [address]  [place], [place], [address] Phone: [phone]   Fax:  [phone]  Name: Christopher Atkinson MRN: [id number] Date of Birth: 1941-09-29

## 2017-03-29 ENCOUNTER — Encounter: Payer: Self-pay | Admitting: Family Medicine

## 2017-03-31 ENCOUNTER — Encounter: Payer: Self-pay | Admitting: Family Medicine

## 2017-04-01 ENCOUNTER — Ambulatory Visit: Payer: PPO | Admitting: Physical Therapy

## 2017-04-01 DIAGNOSIS — R2689 Other abnormalities of gait and mobility: Secondary | ICD-10-CM | POA: Diagnosis not present

## 2017-04-01 DIAGNOSIS — M6281 Muscle weakness (generalized): Secondary | ICD-10-CM

## 2017-04-01 DIAGNOSIS — R296 Repeated falls: Secondary | ICD-10-CM

## 2017-04-01 DIAGNOSIS — R2681 Unsteadiness on feet: Secondary | ICD-10-CM

## 2017-04-01 NOTE — Therapy (Signed)
[facility] [address]  [place], [place], [address] Phone: [phone]   Fax:  [phone]  Physical Therapy Treatment  Patient Details  Name: Christopher Atkinson MRN: [id number] Date of Birth: 1941/09/23 Referring Provider: Dr. Balinda Quails  Encounter Date: 04/01/2017      PT End of Session - 04/01/17 1050    Visit Number 3   Number of Visits 16   Date for PT Re-Evaluation 05/19/17   PT Start Time 1050   PT Stop Time 1144   PT Time Calculation (min) 54 min   Activity Tolerance Patient tolerated treatment well   Behavior During Therapy [facility] for tasks assessed/performed      Past Medical History:  Diagnosis Date  . Aortic insufficiency   . Headache   . Obstructive sleep apnea    on CPAP  . Thoracic aortic aneurysm [facility])     Past Surgical History:  Procedure Laterality Date  . CHOLECYSTECTOMY    . GALLBLADDER SURGERY    . HERNIA REPAIR    . HERNIA REPAIR    . HERNIA REPAIR      There were no vitals filed for this visit.      Subjective Assessment - 04/01/17 1055    Subjective Pt states pain today "is nothing to speak of". Pt states a rough day on Sat, but did not elaborate.   Pertinent History aortic valve anuerysm   Patient Stated Goals improve strength, walk better   Currently in Pain? No/denies                         [facility] Adult PT Treatment/Exercise - 04/01/17 1050      Knee/Hip Exercises: Stretches   Passive Hamstring Stretch Both;30 seconds;2 reps   Passive Hamstring Stretch Limitations manual by PT   Hip Flexor Stretch Right;30 seconds;2 reps   Hip Flexor Stretch Limitations manual by PT; 2nd rep - mod thomas position for HEP   Piriformis Stretch Both;30 seconds;2 reps  each   Piriformis Stretch Limitations figure 4 with overpressure & KTOS     Knee/Hip Exercises: Aerobic   Nustep lvl 4 x 6'           [facility]) - 04/01/17 1050    PWR! exercises Moves in  sitting;Moves in standing;Functional moves   PWR! Up 2x10   PWR! Rock [employer]! Twist x20   PWR Step x20   PWR! Sit to Stand x10   PWR! Up x20   PWR! Rock [employer]! Twist x20   PWR! Step x20             PT Education - 04/01/17 1144    Education provided Yes   Education Details standing PWR! Moves   Person(s) Educated Patient   Methods Explanation;Demonstration;Handout   Comprehension Verbalized understanding;Returned demonstration;Need further instruction          PT Short Term Goals - 03/28/17 1200      PT SHORT TERM GOAL #1   Title Patient to be independent with initial HEP (04/21/16)   Status On-going     PT SHORT TERM GOAL #2   Title Patient to improve Ber Balance score to >/=40/56 demonstrating improved functional mobility and balance (04/21/17)   Status On-going           PT Long Term Goals - 03/28/17 1201      PT LONG TERM GOAL #1   Title  Patient to be independent with advanced HEP (05/19/17)   Status On-going     PT LONG TERM GOAL #2   Title Patient to improve Berg Balance score to >/= 51/56 demonstrating improved functional mobility and balance (05/19/17)   Status On-going     PT LONG TERM GOAL #3   Title Patient to improve TUG to <13 seconds demonstrating reduced fall risk (05/19/17)   Status On-going     PT LONG TERM GOAL #4   Title Patient to improve 5 times sit to stand by >/= 10 seconds (05/19/17)   Status On-going     PT LONG TERM GOAL #5   Title Patient to ambulate > 500 feet on various indoor/outdoor surfaces with Mod I with no LOB and good heel toe gait pattern demonstrating improved funcitonal mobility and overall balance (05/19/17)   Status On-going               Plan - 04/01/17 1057    Clinical Impression Statement HEP stretches reviewed with pt requiring clarification of hold times and to maintain static positioning during stretches. Continued training in [place]! Moves with review of seated moves and introduction to standing  moves. Pt has greatest difficulty with "Twist" moves in both positions, forgetting to return to neutral before transitioning to opposite side and demonstrating limited trunk rotation with movement mostly accomplished through horizontal movement of arms. Incorporated PWR! Up into functional transition for sit to stand with pt demonstrating good extension upon rise to standing. Pt witnessed self-correcting posture while ambulating in clinic, but wife states he does not do this as much at home.   Rehab Potential Good   PT Treatment/Interventions ADLs/Self Care Home Management;Cryotherapy;Electrical Stimulation;Moist Heat;Ultrasound;Neuromuscular re-education;Balance training;Therapeutic exercise;Therapeutic activities;Functional mobility training;Stair training;Gait training;Patient/family education;Manual techniques;Vasopneumatic Device;Taping   PT Next Visit Plan LE stretching; general strengthening & balance with possible incorporation of OTAGO; review seated PWR! Moves and progress to other positions as tolerated   Consulted and Agree with Plan of Care Patient;Family member/caregiver   Family Member Consulted wife      Patient will benefit from skilled therapeutic intervention in order to improve the following deficits and impairments:  Abnormal gait, Decreased activity tolerance, Decreased balance, Decreased mobility, Decreased strength, Difficulty walking  Visit Diagnosis: Other abnormalities of gait and mobility  Unsteadiness on feet  Repeated falls  Muscle weakness (generalized)     Problem List Patient Active Problem List   Diagnosis Date Noted  . 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 Agitation 11/18/2014  . Complicated migraine 41/66/0630  . Cognitive and behavioral changes 11/05/2014  . Altered mental status   . History of migraine headaches   . Acute CVA (cerebrovascular accident) ([place]) 10/27/2014  . Aphasia   . Thoracic aortic aneurysm ([place]) 11/04/2013  .  Aortic insufficiency 11/04/2013  . Obstructive sleep apnea 11/04/2013    Percival Spanish, PT, MPT 04/01/2017, 11:59 AM  [facility] [address]  [place], [place], [address] Phone: [phone]   Fax:  [phone]  Name: Cai Anfinson MRN: [id number] Date of Birth: 1941/02/10

## 2017-04-03 ENCOUNTER — Ambulatory Visit: Payer: PPO | Admitting: Physical Therapy

## 2017-04-03 DIAGNOSIS — R2689 Other abnormalities of gait and mobility: Secondary | ICD-10-CM

## 2017-04-03 DIAGNOSIS — R296 Repeated falls: Secondary | ICD-10-CM

## 2017-04-03 DIAGNOSIS — R2681 Unsteadiness on feet: Secondary | ICD-10-CM

## 2017-04-03 DIAGNOSIS — M6281 Muscle weakness (generalized): Secondary | ICD-10-CM

## 2017-04-03 NOTE — Therapy (Signed)
[facility] [address]  [place], [place], [address] Phone: [phone]   Fax:  [phone]  Physical Therapy Treatment  Patient Details  Name: Christopher Atkinson MRN: [id number] Date of Birth: 10/21/1941 Referring Provider: Dr. Balinda Quails  Encounter Date: 04/03/2017      PT End of Session - 04/03/17 1208    Visit Number 4   Number of Visits 16   Date for PT Re-Evaluation 05/19/17   PT Start Time 1100   PT Stop Time 1146   PT Time Calculation (min) 46 min   Activity Tolerance Patient tolerated treatment well   Behavior During Therapy [facility] for tasks assessed/performed      Past Medical History:  Diagnosis Date  . Aortic insufficiency   . Headache   . Obstructive sleep apnea    on CPAP  . Thoracic aortic aneurysm [facility])     Past Surgical History:  Procedure Laterality Date  . CHOLECYSTECTOMY    . GALLBLADDER SURGERY    . HERNIA REPAIR    . HERNIA REPAIR    . HERNIA REPAIR      There were no vitals filed for this visit.      Subjective Assessment - 04/03/17 1206    Subjective Doing well - Wife mentions that patient was feeling very fatigues this morning - felt better after drinking soda   Patient is accompained by: Family member   Pertinent History aortic valve anuerysm   Patient Stated Goals improve strength, walk better   Currently in Pain? No/denies   Pain Score 0-No pain                              Balance Exercises - 04/03/17 1207      OTAGO PROGRAM   Head Movements Standing;5 reps   Neck Movements Standing;5 reps   Back Extension Standing;5 reps   Trunk Movements Standing;5 reps   Ankle Movements Sitting;10 reps   Knee Extensor 10 reps   Knee Flexor 10 reps   Hip ABductor 10 reps   Ankle Plantorflexors 20 reps, support   Ankle Dorsiflexors 20 reps, support   Knee Bends 10 reps, support   Backwards Walking Support   Walking and Turning Around No assistive device    Sideways Walking No assistive device   Tandem Stance 10 seconds, support   Tandem Walk Support   One Leg Stand 10 seconds, support   Heel Walking Support   Toe Walk Support   Heel Toe Walking Backward --  support   Sit to Stand 10 reps, bilateral support   Stair Walking not done today             PT Short Term Goals - 03/28/17 1200      PT SHORT TERM GOAL #1   Title Patient to be independent with initial HEP (04/21/16)   Status On-going     PT SHORT TERM GOAL #2   Title Patient to improve Ber Balance score to >/=40/56 demonstrating improved functional mobility and balance (04/21/17)   Status On-going           PT Long Term Goals - 03/28/17 1201      PT LONG TERM GOAL #1   Title Patient to be independent with advanced HEP (05/19/17)   Status On-going     PT LONG TERM GOAL #2   Title Patient to improve Berg Balance score to >/= 51/56  demonstrating improved functional mobility and balance (05/19/17)   Status On-going     PT LONG TERM GOAL #3   Title Patient to improve TUG to <13 seconds demonstrating reduced fall risk (05/19/17)   Status On-going     PT LONG TERM GOAL #4   Title Patient to improve 5 times sit to stand by >/= 10 seconds (05/19/17)   Status On-going     PT LONG TERM GOAL #5   Title Patient to ambulate > 500 feet on various indoor/outdoor surfaces with Mod I with no LOB and good heel toe gait pattern demonstrating improved funcitonal mobility and overall balance (05/19/17)   Status On-going               Plan - 04/03/17 1208    Clinical Impression Statement OTAGO balance program introduced in entirety today - patient able to complete all task with only 1 LOB requirng CGA from PT to regain upright balance. No trouble with program otherwise. Educated both wife and patient on program and to only do outlined activities with good safety at home. Patient reporitng feeling fatigued following PWR! at home - educated to alternate days of OTAGO and  balance for hopeful reduction in fatigue with good understanding.    PT Treatment/Interventions ADLs/Self Care Home Management;Cryotherapy;Electrical Stimulation;Moist Heat;Ultrasound;Neuromuscular re-education;Balance training;Therapeutic exercise;Therapeutic activities;Functional mobility training;Stair training;Gait training;Patient/family education;Manual techniques;Vasopneumatic Device;Taping   PT Next Visit Plan LE stretching; general strengthening & balance; review seated PWR! Moves and progress to other positions as tolerated; review of OTAGO as needed   Consulted and Agree with Plan of Care Patient;Family member/caregiver   Family Member Consulted wife      Patient will benefit from skilled therapeutic intervention in order to improve the following deficits and impairments:  Abnormal gait, Decreased activity tolerance, Decreased balance, Decreased mobility, Decreased strength, Difficulty walking  Visit Diagnosis: Other abnormalities of gait and mobility  Unsteadiness on feet  Repeated falls  Muscle weakness (generalized)     Problem List Patient Active Problem List   Diagnosis Date Noted  . 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 Agitation 11/18/2014  . Complicated migraine 83/72/9021  . Cognitive and behavioral changes 11/05/2014  . Altered mental status   . History of migraine headaches   . Acute CVA (cerebrovascular accident) ([place]) 10/27/2014  . Aphasia   . Thoracic aortic aneurysm ([place]) 11/04/2013  . Aortic insufficiency 11/04/2013  . Obstructive sleep apnea 11/04/2013     Lanney Gins, PT, DPT 04/03/17 12:37 PM   [facility] [address]  [place], [place], [address] Phone: [phone]   Fax:  [phone]  Name: Christo Hain MRN: [id number] Date of Birth: 1941-05-26

## 2017-04-08 ENCOUNTER — Ambulatory Visit: Payer: PPO | Admitting: Physical Therapy

## 2017-04-08 DIAGNOSIS — R2689 Other abnormalities of gait and mobility: Secondary | ICD-10-CM | POA: Diagnosis not present

## 2017-04-08 DIAGNOSIS — R296 Repeated falls: Secondary | ICD-10-CM

## 2017-04-08 DIAGNOSIS — M6281 Muscle weakness (generalized): Secondary | ICD-10-CM

## 2017-04-08 DIAGNOSIS — R2681 Unsteadiness on feet: Secondary | ICD-10-CM

## 2017-04-08 NOTE — Patient Instructions (Signed)

## 2017-04-08 NOTE — Therapy (Signed)
[facility] [address]  [place], [place], [address] Phone: [phone]   Fax:  [phone]  Physical Therapy Treatment  Patient Details  Name: Christopher Atkinson MRN: [id number] Date of Birth: 01-26-1941 Referring Provider: Dr. Balinda Quails  Encounter Date: 04/08/2017      PT End of Session - 04/08/17 1109    Visit Number 5   Number of Visits 16   Date for PT Re-Evaluation 05/19/17   PT Start Time 1105   PT Stop Time 1148   PT Time Calculation (min) 43 min   Activity Tolerance Patient tolerated treatment well   Behavior During Therapy [facility] for tasks assessed/performed      Past Medical History:  Diagnosis Date  . Aortic insufficiency   . Headache   . Obstructive sleep apnea    on CPAP  . Thoracic aortic aneurysm [facility])     Past Surgical History:  Procedure Laterality Date  . CHOLECYSTECTOMY    . GALLBLADDER SURGERY    . HERNIA REPAIR    . HERNIA REPAIR    . HERNIA REPAIR      There were no vitals filed for this visit.      Subjective Assessment - 04/08/17 1108    Subjective Doing well today - has been practicing OTAGO and PWR! - no issues currently   Patient is accompained by: Family member   Pertinent History aortic valve anuerysm   Patient Stated Goals improve strength, walk better   Currently in Pain? Yes   Pain Score 5    Pain Location Rib cage   Pain Orientation Right;Lower   Pain Descriptors / Indicators Aching   Pain Type Chronic pain                         [facility] Adult PT Treatment/Exercise - 04/08/17 1110      Neuro Re-ed    Neuro Re-ed Details  large steps on circles 2 x 12; marching around clinic - 2 laps, heavy VC and TC for continued marching and for upright posture; marching in figure 8 pattern - improved form; high knees over bands; lateral high knees over bands;      Knee/Hip Exercises: Stretches   Passive Hamstring Stretch Both;30 seconds;2 reps   Passive  Hamstring Stretch Limitations supine with strap     Knee/Hip Exercises: Aerobic   Nustep level 5 x 6 minutes                  PT Short Term Goals - 03/28/17 1200      PT SHORT TERM GOAL #1   Title Patient to be independent with initial HEP (04/21/16)   Status On-going     PT SHORT TERM GOAL #2   Title Patient to improve Ber Balance score to >/=40/56 demonstrating improved functional mobility and balance (04/21/17)   Status On-going           PT Long Term Goals - 03/28/17 1201      PT LONG TERM GOAL #1   Title Patient to be independent with advanced HEP (05/19/17)   Status On-going     PT LONG TERM GOAL #2   Title Patient to improve Berg Balance score to >/= 51/56 demonstrating improved functional mobility and balance (05/19/17)   Status On-going     PT LONG TERM GOAL #3   Title Patient to improve TUG to <13 seconds demonstrating reduced fall risk (05/19/17)  Status On-going     PT LONG TERM GOAL #4   Title Patient to improve 5 times sit to stand by >/= 10 seconds (05/19/17)   Status On-going     PT LONG TERM GOAL #5   Title Patient to ambulate > 500 feet on various indoor/outdoor surfaces with Mod I with no LOB and good heel toe gait pattern demonstrating improved funcitonal mobility and overall balance (05/19/17)   Status On-going               Plan - 04/08/17 1753    Clinical Impression Statement Christopher Atkinson doing well today - both he and wife report good compliance with HEP for OTAGO and PWR!Marland Kitchen PT session today focusing on taking larger steps to reduce overall shuffling of gait as well as gait with upright posture. Some carryover from task to task, however does require continuous VC for good step through pattern and posture. Will continue to progress PWR! moves, balance and general strength at upcoming visits.    PT Treatment/Interventions ADLs/Self Care Home Management;Cryotherapy;Electrical Stimulation;Moist Heat;Ultrasound;Neuromuscular  re-education;Balance training;Therapeutic exercise;Therapeutic activities;Functional mobility training;Stair training;Gait training;Patient/family education;Manual techniques;Vasopneumatic Device;Taping   PT Next Visit Plan LE stretching; general strengthening & balance; review seated PWR! Moves and progress to other positions as tolerated; review of OTAGO as needed   Consulted and Agree with Plan of Care Patient;Family member/caregiver      Patient will benefit from skilled therapeutic intervention in order to improve the following deficits and impairments:  Abnormal gait, Decreased activity tolerance, Decreased balance, Decreased mobility, Decreased strength, Difficulty walking  Visit Diagnosis: Other abnormalities of gait and mobility  Unsteadiness on feet  Repeated falls  Muscle weakness (generalized)     Problem List Patient Active Problem List   Diagnosis Date Noted  . 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 Agitation 11/18/2014  . Complicated migraine 56/70/1410  . Cognitive and behavioral changes 11/05/2014  . Altered mental status   . History of migraine headaches   . Acute CVA (cerebrovascular accident) ([place]) 10/27/2014  . Aphasia   . Thoracic aortic aneurysm ([place]) 11/04/2013  . Aortic insufficiency 11/04/2013  . Obstructive sleep apnea 11/04/2013     Lanney Gins, PT, DPT 04/08/17 6:03 PM   [facility] [address]  [place], [place], [address] Phone: [phone]   Fax:  [phone]  Name: Christopher Atkinson MRN: [id number] Date of Birth: 06/24/1941

## 2017-04-10 ENCOUNTER — Ambulatory Visit: Payer: PPO | Admitting: Physical Therapy

## 2017-04-10 DIAGNOSIS — R2689 Other abnormalities of gait and mobility: Secondary | ICD-10-CM

## 2017-04-10 DIAGNOSIS — R2681 Unsteadiness on feet: Secondary | ICD-10-CM

## 2017-04-10 DIAGNOSIS — R296 Repeated falls: Secondary | ICD-10-CM

## 2017-04-10 DIAGNOSIS — M6281 Muscle weakness (generalized): Secondary | ICD-10-CM

## 2017-04-10 NOTE — Therapy (Signed)
[facility] [address]  [place], [place], [address] Phone: [phone]   Fax:  [phone]  Physical Therapy Treatment  Patient Details  Name: Christopher Atkinson MRN: [id number] Date of Birth: January 13, 1941 Referring Provider: Dr. Balinda Quails  Encounter Date: 04/10/2017      PT End of Session - 04/10/17 1015    Visit Number 6   Number of Visits 16   Date for PT Re-Evaluation 05/19/17   PT Start Time 6962   PT Stop Time 1100   PT Time Calculation (min) 45 min   Activity Tolerance Patient tolerated treatment well   Behavior During Therapy [facility] for tasks assessed/performed      Past Medical History:  Diagnosis Date  . Aortic insufficiency   . Headache   . Obstructive sleep apnea    on CPAP  . Thoracic aortic aneurysm [facility])     Past Surgical History:  Procedure Laterality Date  . CHOLECYSTECTOMY    . GALLBLADDER SURGERY    . HERNIA REPAIR    . HERNIA REPAIR    . HERNIA REPAIR      There were no vitals filed for this visit.      Subjective Assessment - 04/10/17 1020    Patient is accompained by: Family member   Pertinent History aortic valve anuerysm   Patient Stated Goals improve strength, walk better   Currently in Pain? Yes   Pain Score 5    Pain Location Rib cage   Pain Orientation Right;Lower   Pain Descriptors / Indicators Aching   Pain Type Chronic pain                         [facility] Adult PT Treatment/Exercise - 04/10/17 1015      Ambulation/Gait   Ambulation/Gait Yes   Ambulation/Gait Assistance 5: Supervision   Ambulation/Gait Assistance Details Cues for carryover of PWR! step in to gait with cues for increased step length and good foot clearance.     Knee/Hip Exercises: Aerobic   Nustep level 5 x 6 minutes           PWR [facility]) - 04/10/17 1015    PWR! exercises Moves in sitting;Moves in standing;Functional moves   PWR! Up x20   PWR! Rock [employer]! Twist x20   PWR Step x20   PWR! Step Through Forward/Back fwd, back & combined fwd/back x10 each   PWR! Sit to Stand 2x10, 2nd set with alt PWR! Rock upon standing   PWR! Up x20   PWR! Rock [employer] Twist x20   PWR! Step x20               PT Short Term Goals - 03/28/17 1200      PT SHORT TERM GOAL #1   Title Patient to be independent with initial HEP (04/21/16)   Status On-going     PT SHORT TERM GOAL #2   Title Patient to improve Ber Balance score to >/=40/56 demonstrating improved functional mobility and balance (04/21/17)   Status On-going           PT Long Term Goals - 03/28/17 1201      PT LONG TERM GOAL #1   Title Patient to be independent with advanced HEP (05/19/17)   Status On-going     PT LONG TERM GOAL #2   Title Patient to improve Berg Balance score to >/= 51/56 demonstrating improved functional  mobility and balance (05/19/17)   Status On-going     PT LONG TERM GOAL #3   Title Patient to improve TUG to <13 seconds demonstrating reduced fall risk (05/19/17)   Status On-going     PT LONG TERM GOAL #4   Title Patient to improve 5 times sit to stand by >/= 10 seconds (05/19/17)   Status On-going     PT LONG TERM GOAL #5   Title Patient to ambulate > 500 feet on various indoor/outdoor surfaces with Mod I with no LOB and good heel toe gait pattern demonstrating improved funcitonal mobility and overall balance (05/19/17)   Status On-going               Plan - 04/10/17 1025    Clinical Impression Statement Review PWR! Moves in sititng and standing with pt continuing to struggle to keep rhythm and achieve desired motions. Encouraged increased eye/head movment with cues for eyes to follow hand during Rock, Twist and Step pattern in both sitting and standing. Continued carryover into functional movement pattern such as PWR! Up with sit to stand to promote upright posture and introduced forward and backward step with carrover into walking.   Rehab Potential Good   PT  Treatment/Interventions ADLs/Self Care Home Management;Cryotherapy;Electrical Stimulation;Moist Heat;Ultrasound;Neuromuscular re-education;Balance training;Therapeutic exercise;Therapeutic activities;Functional mobility training;Stair training;Gait training;Patient/family education;Manual techniques;Vasopneumatic Device;Taping   PT Next Visit Plan LE stretching; general strengthening & balance; review seated, standing and fucntional PWR! Moves and progress to other positions as tolerated; review of OTAGO as needed   Consulted and Agree with Plan of Care Patient;Family member/caregiver   Family Member Consulted wife      Patient will benefit from skilled therapeutic intervention in order to improve the following deficits and impairments:  Abnormal gait, Decreased activity tolerance, Decreased balance, Decreased mobility, Decreased strength, Difficulty walking  Visit Diagnosis: Other abnormalities of gait and mobility  Unsteadiness on feet  Repeated falls  Muscle weakness (generalized)     Problem List Patient Active Problem List   Diagnosis Date Noted  . 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 Agitation 11/18/2014  . Complicated migraine 94/76/5465  . Cognitive and behavioral changes 11/05/2014  . Altered mental status   . History of migraine headaches   . Acute CVA (cerebrovascular accident) ([place]) 10/27/2014  . Aphasia   . Thoracic aortic aneurysm ([place]) 11/04/2013  . Aortic insufficiency 11/04/2013  . Obstructive sleep apnea 11/04/2013    Percival Spanish, PT, MPT 04/10/2017, 11:31 AM  [facility] [address]  [place], [place], [address] Phone: [phone]   Fax:  [phone]  Name: Jeremiah Curci MRN: [id number] Date of Birth: Jul 01, 1941

## 2017-04-11 ENCOUNTER — Encounter: Payer: Self-pay | Admitting: Physical Therapy

## 2017-04-15 ENCOUNTER — Ambulatory Visit: Payer: PPO | Admitting: Physical Therapy

## 2017-04-15 DIAGNOSIS — R2689 Other abnormalities of gait and mobility: Secondary | ICD-10-CM | POA: Diagnosis not present

## 2017-04-15 DIAGNOSIS — R2681 Unsteadiness on feet: Secondary | ICD-10-CM

## 2017-04-15 DIAGNOSIS — R296 Repeated falls: Secondary | ICD-10-CM

## 2017-04-15 DIAGNOSIS — M6281 Muscle weakness (generalized): Secondary | ICD-10-CM

## 2017-04-15 NOTE — Therapy (Signed)
[facility] [address]  [place], [place], [address] Phone: [phone]   Fax:  [phone]  Physical Therapy Treatment  Patient Details  Name: Christopher Atkinson MRN: [id number] Date of Birth: 1941-05-05 Referring Provider: Dr. Balinda Quails  Encounter Date: 04/15/2017      PT End of Session - 04/15/17 1542    Visit Number 7   Number of Visits 16   Date for PT Re-Evaluation 05/19/17   PT Start Time 1100   PT Stop Time 1140   PT Time Calculation (min) 40 min   Activity Tolerance Patient tolerated treatment well   Behavior During Therapy [facility] for tasks assessed/performed      Past Medical History:  Diagnosis Date  . Aortic insufficiency   . Headache   . Obstructive sleep apnea    on CPAP  . Thoracic aortic aneurysm [facility])     Past Surgical History:  Procedure Laterality Date  . CHOLECYSTECTOMY    . GALLBLADDER SURGERY    . HERNIA REPAIR    . HERNIA REPAIR    . HERNIA REPAIR      There were no vitals filed for this visit.      Subjective Assessment - 04/15/17 1541    Subjective Feeling well today - wife states that he has been having migraines since last week - does take medication for this   Patient is accompained by: Family member   Pertinent History aortic valve anuerysm   Patient Stated Goals improve strength, walk better   Currently in Pain? Yes   Pain Score --  not rated    Pain Location Head   Pain Descriptors / Indicators Headache                         [facility] Adult PT Treatment/Exercise - 04/15/17 0001      Knee/Hip Exercises: Aerobic   Nustep level 5 x 6 minutes     Knee/Hip Exercises: Seated   Sit to Sand 15 reps;with UE support  standing up onto AirEx; emphasis on upright posture             Balance Exercises - 04/15/17 1544      Balance Exercises: Standing   SLS Eyes open;Solid surface  knocking over/sitting up cones; double taps to cones   Gait with  Head Turns Forward  CGA; heavy VC/TC for posture - 2 laps around gym   Retro Gait --  CGA with heavy VC/TC for posture and balance   Cone Rotation Solid surface  weaving of cones - 4 laps - 8 cones   Marching Limitations marching around gym - emphasis on posture and large stepping    Other Standing Exercises gait with cognitive tasks - naming animals with letters of the alphabet - difficulty - multiple rest breaks; hevay VC/TC for posture             PT Short Term Goals - 03/28/17 1200      PT SHORT TERM GOAL #1   Title Patient to be independent with initial HEP (04/21/16)   Status On-going     PT SHORT TERM GOAL #2   Title Patient to improve Ber Balance score to >/=40/56 demonstrating improved functional mobility and balance (04/21/17)   Status On-going           PT Long Term Goals - 03/28/17 1201      PT LONG TERM GOAL #1   Title  Patient to be independent with advanced HEP (05/19/17)   Status On-going     PT LONG TERM GOAL #2   Title Patient to improve Berg Balance score to >/= 51/56 demonstrating improved functional mobility and balance (05/19/17)   Status On-going     PT LONG TERM GOAL #3   Title Patient to improve TUG to <13 seconds demonstrating reduced fall risk (05/19/17)   Status On-going     PT LONG TERM GOAL #4   Title Patient to improve 5 times sit to stand by >/= 10 seconds (05/19/17)   Status On-going     PT LONG TERM GOAL #5   Title Patient to ambulate > 500 feet on various indoor/outdoor surfaces with Mod I with no LOB and good heel toe gait pattern demonstrating improved funcitonal mobility and overall balance (05/19/17)   Status On-going               Plan - 04/15/17 1543    Clinical Impression Statement Patient doing well today - both he and wife report recent migraine headaches limting ability to perform HEP at home. PT session today focusing on SL balance as well as taking large steps with good posturing. Most difficulty with walking with  cognitive tasks with aptient requiring frequent rest breaks along with long periods of though before being able to answer directed question. Will continue to focus on general balance, PWR! moves, as well as psotural re-ed needed for reduced fall risk.    PT Treatment/Interventions ADLs/Self Care Home Management;Cryotherapy;Electrical Stimulation;Moist Heat;Ultrasound;Neuromuscular re-education;Balance training;Therapeutic exercise;Therapeutic activities;Functional mobility training;Stair training;Gait training;Patient/family education;Manual techniques;Vasopneumatic Device;Taping   PT Next Visit Plan LE stretching; general strengthening & balance; review seated, standing and fucntional PWR! Moves and progress to other positions as tolerated; review of OTAGO as needed   Consulted and Agree with Plan of Care Patient;Family member/caregiver      Patient will benefit from skilled therapeutic intervention in order to improve the following deficits and impairments:  Abnormal gait, Decreased activity tolerance, Decreased balance, Decreased mobility, Decreased strength, Difficulty walking  Visit Diagnosis: Other abnormalities of gait and mobility  Unsteadiness on feet  Repeated falls  Muscle weakness (generalized)     Problem List Patient Active Problem List   Diagnosis Date Noted  . 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 Agitation 11/18/2014  . Complicated migraine 42/87/6811  . Cognitive and behavioral changes 11/05/2014  . Altered mental status   . History of migraine headaches   . Acute CVA (cerebrovascular accident) ([place]) 10/27/2014  . Aphasia   . Thoracic aortic aneurysm ([place]) 11/04/2013  . Aortic insufficiency 11/04/2013  . Obstructive sleep apnea 11/04/2013     Lanney Gins, PT, DPT 04/15/17 3:54 PM   [facility] [address]  [place], [place], [address] Phone: [phone]   Fax:   [phone]  Name: Davis Ambrosini MRN: [id number] Date of Birth: [birth year]

## 2017-04-17 ENCOUNTER — Ambulatory Visit: Payer: PPO | Admitting: Physical Therapy

## 2017-04-17 DIAGNOSIS — R296 Repeated falls: Secondary | ICD-10-CM

## 2017-04-17 DIAGNOSIS — M6281 Muscle weakness (generalized): Secondary | ICD-10-CM

## 2017-04-17 DIAGNOSIS — R2689 Other abnormalities of gait and mobility: Secondary | ICD-10-CM

## 2017-04-17 DIAGNOSIS — R2681 Unsteadiness on feet: Secondary | ICD-10-CM

## 2017-04-17 NOTE — Therapy (Signed)
[facility] [address]  [place], [place], [address] Phone: [phone]   Fax:  [phone]  Physical Therapy Treatment  Patient Details  Name: Christopher Atkinson MRN: [id number] Date of Birth: February 08, 1941 Referring Provider: Dr. Balinda Quails  Encounter Date: 04/17/2017      PT End of Session - 04/17/17 1101    Visit Number 8   Number of Visits 16   Date for PT Re-Evaluation 05/19/17   PT Start Time 1056   PT Stop Time 1139   PT Time Calculation (min) 43 min   Activity Tolerance Patient tolerated treatment well   Behavior During Therapy [facility] for tasks assessed/performed      Past Medical History:  Diagnosis Date  . Aortic insufficiency   . Headache   . Obstructive sleep apnea    on CPAP  . Thoracic aortic aneurysm [facility])     Past Surgical History:  Procedure Laterality Date  . CHOLECYSTECTOMY    . GALLBLADDER SURGERY    . HERNIA REPAIR    . HERNIA REPAIR    . HERNIA REPAIR      There were no vitals filed for this visit.      Subjective Assessment - 04/17/17 1101    Subjective Feels like there is a bug in his ear, head feels better - no other concerns   Patient is accompained by: Family member   Pertinent History aortic valve anuerysm   Patient Stated Goals improve strength, walk better   Currently in Pain? No/denies   Pain Score 0-No pain                         [facility] Adult PT Treatment/Exercise - 04/17/17 0001      Knee/Hip Exercises: Aerobic   Nustep level 6 x 6 minutes             Balance Exercises - 04/17/17 1615      Balance Exercises: Standing   Gait with Head Turns Forward  scanning environment for playing cards   Sidestepping Foam/compliant support;3 reps   Other Standing Exercises sit to stand between to chairs - emphasis on completely turning prior to sitting              PT Short Term Goals - 03/28/17 1200      PT SHORT TERM GOAL #1   Title Patient  to be independent with initial HEP (04/21/16)   Status On-going     PT SHORT TERM GOAL #2   Title Patient to improve Ber Balance score to >/=40/56 demonstrating improved functional mobility and balance (04/21/17)   Status On-going           PT Long Term Goals - 03/28/17 1201      PT LONG TERM GOAL #1   Title Patient to be independent with advanced HEP (05/19/17)   Status On-going     PT LONG TERM GOAL #2   Title Patient to improve Berg Balance score to >/= 51/56 demonstrating improved functional mobility and balance (05/19/17)   Status On-going     PT LONG TERM GOAL #3   Title Patient to improve TUG to <13 seconds demonstrating reduced fall risk (05/19/17)   Status On-going     PT LONG TERM GOAL #4   Title Patient to improve 5 times sit to stand by >/= 10 seconds (05/19/17)   Status On-going     PT LONG TERM GOAL #5  Title Patient to ambulate > 500 feet on various indoor/outdoor surfaces with Mod I with no LOB and good heel toe gait pattern demonstrating improved funcitonal mobility and overall balance (05/19/17)   Status On-going               Plan - 04/17/17 1624    Clinical Impression Statement Paitent doing well today - reports of a bug in his ear - advised to see MD if symptoms persist. Today's session focusing heavily on safety with sit to stand transfers as well as scanning environment with emphasis on proper posturing. High tendency to fixate gaze on floor, therefore reinforcing gave to at and above eye level. Discussion with patients wife on how to carryover this, such as in grocery store and prompting patient to look for items on various shelves with good verbal understanding.    PT Treatment/Interventions ADLs/Self Care Home Management;Cryotherapy;Electrical Stimulation;Moist Heat;Ultrasound;Neuromuscular re-education;Balance training;Therapeutic exercise;Therapeutic activities;Functional mobility training;Stair training;Gait training;Patient/family education;Manual  techniques;Vasopneumatic Device;Taping   PT Next Visit Plan LE stretching; general strengthening & balance; review seated, standing and fucntional PWR! Moves and progress to other positions as tolerated; review of OTAGO as needed   Consulted and Agree with Plan of Care Patient;Family member/caregiver      Patient will benefit from skilled therapeutic intervention in order to improve the following deficits and impairments:  Abnormal gait, Decreased activity tolerance, Decreased balance, Decreased mobility, Decreased strength, Difficulty walking  Visit Diagnosis: Other abnormalities of gait and mobility  Unsteadiness on feet  Repeated falls  Muscle weakness (generalized)     Problem List Patient Active Problem List   Diagnosis Date Noted  . 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 Agitation 11/18/2014  . Complicated migraine 40/98/1191  . Cognitive and behavioral changes 11/05/2014  . Altered mental status   . History of migraine headaches   . Acute CVA (cerebrovascular accident) ([place]) 10/27/2014  . Aphasia   . Thoracic aortic aneurysm ([place]) 11/04/2013  . Aortic insufficiency 11/04/2013  . Obstructive sleep apnea 11/04/2013     Lanney Gins, PT, DPT 04/17/17 4:28 PM   [facility] [address]  [place], [place], [address] Phone: [phone]   Fax:  [phone]  Name: Christopher Atkinson MRN: [id number] Date of Birth: 01-25-1941

## 2017-04-21 ENCOUNTER — Encounter: Payer: Self-pay | Admitting: Family

## 2017-04-21 ENCOUNTER — Ambulatory Visit (INDEPENDENT_AMBULATORY_CARE_PROVIDER_SITE_OTHER): Payer: PPO | Admitting: Family

## 2017-04-21 VITALS — BP 124/60 | HR 53 | Temp 97.6°F | Resp 16 | Ht 70.0 in | Wt 168.6 lb

## 2017-04-21 DIAGNOSIS — H9392 Unspecified disorder of left ear: Secondary | ICD-10-CM | POA: Diagnosis not present

## 2017-04-21 NOTE — Progress Notes (Signed)
Pre visit review using our clinic review tool, if applicable. No additional management support is needed unless otherwise documented below in the visit note. 

## 2017-04-21 NOTE — Progress Notes (Signed)
Subjective:    Patient ID: Christopher Atkinson, male    DOB: 1941-08-30, 76 y.o.   MRN: [id number]  HPI  Christopher Atkinson is a 76 yr old male with hx of memory loss who presents today with his wife due to concern of a "Bug" in his left ear. Reports that it flew into his left ear 1 week ago. Heard buzzing in the left ear "all night long." Buzzing is now resolved. Concerned that it has not come out of his ear.  Denies otalgia.    Review of Systems See HPI  Past Medical History:  Diagnosis Date  . Aortic insufficiency   . Headache   . Obstructive sleep apnea    on CPAP  . Thoracic aortic aneurysm [facility])      Social History   Social History  . Marital status: Married    Spouse name: [name]   . Number of children: 2  . Years of education: 12   Occupational History  . Not on file.   Social History Main Topics  . Smoking status: Former Research scientist (life sciences)  . Smokeless tobacco: Never Used     Comment: quit smoking when he was 76 years old  . Alcohol use No  . Drug use: No  . Sexual activity: Not on file   Other Topics Concern  . Not on file   Social History Narrative   Patient lives at home with wife [name]    Patient as 2 children.    Patient is right handed.    Patient has a high school education.        Past Surgical History:  Procedure Laterality Date  . CHOLECYSTECTOMY    . GALLBLADDER SURGERY    . HERNIA REPAIR    . HERNIA REPAIR    . HERNIA REPAIR      Family History  Problem Relation Age of Onset  . Heart Problems Father     CHF  . Dementia Mother   . Stroke Brother   . Dementia Brother   . Parkinson's disease Brother   . Alzheimer's disease Brother   . Cancer Brother     Throat cancer    No Known Allergies  Current Outpatient Prescriptions on File Prior to Visit  Medication Sig Dispense Refill  . aspirin 81 MG tablet Take 81 mg by mouth daily.    . butalbital-acetaminophen-caffeine (FIORICET, ESGIC) 50-325-40 MG tablet TAKE 1 TABLET BY MOUTH EVERY 6 HOURS AS  NEEDED FOR HEADACHE( DO NOT TAKE MORE THAN 10 TABLETS PER MONTH) 10 tablet 5  . divalproex (DEPAKOTE ER) 500 MG 24 hr tablet Take 1 tablet (500 mg total) by mouth daily. 90 tablet 5  . donepezil (ARICEPT) 10 MG tablet Take 1 tablet (10 mg total) by mouth at bedtime. 90 tablet 5  . MAGNESIUM PO Take 1 tablet by mouth daily.    . memantine (NAMENDA TITRATION PAK) tablet pack 5 mg/day for =1 week; 5 mg twice daily for =1 week; 15 mg/day given in 5 mg and 10 mg separated doses for =1 week; then 10 mg twice daily 49 tablet 12  . memantine (NAMENDA XR) 28 MG CP24 24 hr capsule Take 1 capsule (28 mg total) by mouth daily. 30 capsule 5  . Multiple Vitamins-Minerals ([employer] SPECTRAVITE ADULT 50+ PO) Take 1 tablet by mouth daily.    . Omega-3 Fatty Acids (FISH OIL) 1000 MG CAPS Take 1 tablet by mouth 3 (three) times daily.     Clarnce Flock Palmetto 450  MG CAPS Take 1 capsule by mouth daily.    Marland Kitchen TAMSULOSIN HCL PO Take 10 mg by mouth daily.    Marland Kitchen tobramycin-dexamethasone (TOBRADEX) ophthalmic solution Place 1 drop into both eyes as needed.  0   No current facility-administered medications on file prior to visit.     BP 124/60 (BP Location: Right Arm, Cuff Size: Normal)   Pulse (!) 53   Temp 97.6 F (36.4 C) (Oral)   Resp 16   Ht 5\' 10"  (1.778 m)   Wt 168 lb 9.6 oz (76.5 kg)   SpO2 100% Comment: room air  BMI 24.19 kg/m       Objective:   Physical Exam  Constitutional: He is oriented to person, place, and time. He appears well-developed and well-nourished. No distress.  HENT:  Head: Normocephalic and atraumatic.  Right Ear: Tympanic membrane and ear canal normal.  Left Ear: Tympanic membrane and ear canal normal.  Cardiovascular: Normal rate and regular rhythm.   No murmur heard. Pulmonary/Chest: Effort normal and breath sounds normal. No respiratory distress. He has no wheezes. He has no rales.  Musculoskeletal: He exhibits no edema.  Neurological: He is alert and oriented to person, place, and  time.  Skin: Skin is warm and dry.  Psychiatric: He has a normal mood and affect. His behavior is normal. Thought content normal.          Assessment & Plan:  Ear problem- resolved.  Reassurance provided- no sign of insect in ear currently. I suspect he may have experienced some tinnitus which he perceived as an insect.  Symptoms resolved.

## 2017-04-22 ENCOUNTER — Ambulatory Visit: Payer: PPO | Attending: Neurology | Admitting: Physical Therapy

## 2017-04-22 DIAGNOSIS — R2689 Other abnormalities of gait and mobility: Secondary | ICD-10-CM

## 2017-04-22 DIAGNOSIS — R296 Repeated falls: Secondary | ICD-10-CM | POA: Insufficient documentation

## 2017-04-22 DIAGNOSIS — R2681 Unsteadiness on feet: Secondary | ICD-10-CM | POA: Diagnosis not present

## 2017-04-22 DIAGNOSIS — M6281 Muscle weakness (generalized): Secondary | ICD-10-CM | POA: Diagnosis not present

## 2017-04-22 DIAGNOSIS — R293 Abnormal posture: Secondary | ICD-10-CM | POA: Insufficient documentation

## 2017-04-22 NOTE — Therapy (Addendum)
[facility] [address]  [place], [place], [address] Phone: [phone]   Fax:  [phone]  Physical Therapy Treatment  Patient Details  Name: Christopher Atkinson MRN: [id number] Date of Birth: 1941/06/09 Referring Provider: Dr. Balinda Quails  Encounter Date: 04/22/2017      PT End of Session - 04/22/17 1102    Visit Number 9   Number of Visits 16   Date for PT Re-Evaluation 05/19/17   PT Start Time 1102   PT Stop Time 1151   PT Time Calculation (min) 49 min   Activity Tolerance Patient tolerated treatment well   Behavior During Therapy [facility] for tasks assessed/performed      Past Medical History:  Diagnosis Date  . Aortic insufficiency   . Headache   . Obstructive sleep apnea    on CPAP  . Thoracic aortic aneurysm [facility])     Past Surgical History:  Procedure Laterality Date  . CHOLECYSTECTOMY    . GALLBLADDER SURGERY    . HERNIA REPAIR    . HERNIA REPAIR    . HERNIA REPAIR      There were no vitals filed for this visit.      Subjective Assessment - 04/22/17 1105    Patient is accompained by: Family member   Pertinent History aortic valve anuerysm   Patient Stated Goals improve strength, walk better   Currently in Pain? Yes   Pain Score 3    Pain Location Back   Pain Orientation Mid;Right   Pain Type Chronic pain   Pain Onset More than a month ago   Pain Frequency Intermittent                         [facility] Adult PT Treatment/Exercise - 04/22/17 1102      Ambulation/Gait   Ambulation/Gait Assistance Details Cues for carryover of PWR! step in to gait with cues for increased step length and good foot clearance.     Lumbar Exercises: Stretches   Lower Trunk Rotation 3 reps;10 seconds   Lower Trunk Rotation Limitations as activation prior to initiation of supine PWR! Moves     Knee/Hip Exercises: Aerobic   Nustep level 6 x 6 minutes           PWR [facility]) - 04/22/17 1102    PWR! exercises Moves in supine;Moves in sitting;Moves in standing;Functional moves   PWR! Up x10   PWR! Rock [employer]! Twist x20   PWR! Step x20   PWR! Up x20   PWR! Rock [employer] Twist x20   PWR Step x20   PWR! Sit to Stand x10   PWR! Up x20   PWR! Rock [employer] Twist x20   PWR! Step x20             PT Education - 04/22/17 1151    Education provided Yes   Education Details supine PWR! Moves   Person(s) Educated Patient;Spouse   Methods Explanation;Demonstration;Handout   Comprehension Verbalized understanding;Returned demonstration;Need further instruction          PT Short Term Goals - 03/28/17 1200      PT SHORT TERM GOAL #1   Title Patient to be independent with initial HEP (04/21/16)   Status On-going     PT SHORT TERM GOAL #2   Title Patient to improve Ber Balance score to >/=40/56 demonstrating improved functional mobility and balance (04/21/17)  Status On-going           PT Long Term Goals - 03/28/17 1201      PT LONG TERM GOAL #1   Title Patient to be independent with advanced HEP (05/19/17)   Status On-going     PT LONG TERM GOAL #2   Title Patient to improve Berg Balance score to >/= 51/56 demonstrating improved functional mobility and balance (05/19/17)   Status On-going     PT LONG TERM GOAL #3   Title Patient to improve TUG to <13 seconds demonstrating reduced fall risk (05/19/17)   Status On-going     PT LONG TERM GOAL #4   Title Patient to improve 5 times sit to stand by >/= 10 seconds (05/19/17)   Status On-going     PT LONG TERM GOAL #5   Title Patient to ambulate > 500 feet on various indoor/outdoor surfaces with Mod I with no LOB and good heel toe gait pattern demonstrating improved funcitonal mobility and overall balance (05/19/17)   Status On-going               Plan - 04/22/17 1107    Clinical Impression Statement Pt noting R mid back pain again today which dates back at least to time of fall prior to start of PT,  if not more longstanding. Introduced supine PWR! Moves today with pt demonstrating good performance other than continued difficulty with Twist in pt forgetting to return to neutral position before shifting to opposite side (similar problems with Twist in sitt and standing). Reviewed sitting and standing PWR! Moves and reinforced carryover into functional mobiliy and gait with emphasis on upright posture and gaze following movement patterns rather than fixed on floor.   Rehab Potential Good   PT Treatment/Interventions ADLs/Self Care Home Management;Cryotherapy;Electrical Stimulation;Moist Heat;Ultrasound;Neuromuscular re-education;Balance training;Therapeutic exercise;Therapeutic activities;Functional mobility training;Stair training;Gait training;Patient/family education;Manual techniques;Vasopneumatic Device;Taping   PT Next Visit Plan LE stretching; general strengthening & balance; review supine, seated, standing and fucntional PWR! Moves and progress to other positions as tolerated; review of OTAGO as needed   Consulted and Agree with Plan of Care Patient;Family member/caregiver      Patient will benefit from skilled therapeutic intervention in order to improve the following deficits and impairments:  Abnormal gait, Decreased activity tolerance, Decreased balance, Decreased mobility, Decreased strength, Difficulty walking  Visit Diagnosis: Other abnormalities of gait and mobility  Unsteadiness on feet  Repeated falls  Muscle weakness (generalized)     Problem List Patient Active Problem List   Diagnosis Date Noted  . 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 Agitation 11/18/2014  . Complicated migraine 12/75/1700  . Cognitive and behavioral changes 11/05/2014  . Altered mental status   . History of migraine headaches   . Acute CVA (cerebrovascular accident) ([place]) 10/27/2014  . Aphasia   . Thoracic aortic aneurysm ([place]) 11/04/2013  . Aortic insufficiency 11/04/2013  .  Obstructive sleep apnea 11/04/2013    Percival Spanish, PT, MPT 04/22/2017, 12:19 PM  [facility] [address]  [place], [place], [address] Phone: [phone]   Fax:  [phone]  Name: Christopher Atkinson MRN: [id number] Date of Birth: 01/24/1941

## 2017-04-24 ENCOUNTER — Ambulatory Visit: Payer: PPO | Admitting: Physical Therapy

## 2017-04-24 DIAGNOSIS — R2681 Unsteadiness on feet: Secondary | ICD-10-CM

## 2017-04-24 DIAGNOSIS — R2689 Other abnormalities of gait and mobility: Secondary | ICD-10-CM

## 2017-04-24 DIAGNOSIS — M6281 Muscle weakness (generalized): Secondary | ICD-10-CM

## 2017-04-24 DIAGNOSIS — R296 Repeated falls: Secondary | ICD-10-CM

## 2017-04-24 NOTE — Therapy (Signed)
[facility] [address]  [place], [place], [address] Phone: [phone]   Fax:  [phone]  Physical Therapy Treatment  Patient Details  Name: Christopher Atkinson MRN: [id number] Date of Birth: 26-Jun-1941 Referring Provider: Dr. Balinda Quails  Encounter Date: 04/24/2017      PT End of Session - 04/24/17 1113    Visit Number 10   Number of Visits 16   Date for PT Re-Evaluation 05/19/17   PT Start Time 1109   PT Stop Time 1201   PT Time Calculation (min) 52 min   Activity Tolerance Patient tolerated treatment well   Behavior During Therapy [facility] for tasks assessed/performed      Past Medical History:  Diagnosis Date  . Aortic insufficiency   . Headache   . Obstructive sleep apnea    on CPAP  . Thoracic aortic aneurysm [facility])     Past Surgical History:  Procedure Laterality Date  . CHOLECYSTECTOMY    . GALLBLADDER SURGERY    . HERNIA REPAIR    . HERNIA REPAIR    . HERNIA REPAIR      There were no vitals filed for this visit.      Subjective Assessment - 04/24/17 1112    Subjective Pt reports feeling "wheezy" and nervous/anxious today.   Patient is accompained by: Family member   Pertinent History aortic valve anuerysm   Patient Stated Goals improve strength, walk better   Currently in Pain? No/denies   Pain Onset More than a month ago            [facility] PT Assessment - 04/24/17 1109      Assessment   Medical Diagnosis Gait abnormality   Referring Provider Dr. Balinda Quails   Next MD Visit 08/06/17   Prior Therapy yes - spring 2017     Observation/Other Assessments   Focus on Therapeutic Outcomes (FOTO)  Neuro: 34% (66% limitation)     Ambulation/Gait   Gait velocity 2.35 ft/sec     Standardized Balance Assessment   Standardized Balance Assessment Berg Balance Test;Timed Up and Go Test;Five Times Sit to Stand;10 meter walk test   Five times sit to stand comments  23.16   10 Meter Walk 13.97      Berg Balance Test   Sit to Stand Able to stand without using hands and stabilize independently   Standing Unsupported Able to stand safely 2 minutes   Sitting with Back Unsupported but Feet Supported on Floor or Stool Able to sit safely and securely 2 minutes   Stand to Sit Sits safely with minimal use of hands   Transfers Able to transfer safely, minor use of hands   Standing Unsupported with Eyes Closed Able to stand 10 seconds with supervision   Standing Ubsupported with Feet Together Able to place feet together independently and stand 1 minute safely   From Standing, Reach Forward with Outstretched Arm Can reach forward >12 cm safely (5")   From Standing Position, Pick up Object from Floor Able to pick up shoe, needs supervision   From Standing Position, Turn to Look Behind Over each Shoulder Turn sideways only but maintains balance   Turn 360 Degrees Needs close supervision or verbal cueing   Standing Unsupported, Alternately Place Feet on Step/Stool Able to complete >2 steps/needs minimal assist   Standing Unsupported, One Foot in Front Able to take small step independently and hold 30 seconds   Standing on One Leg Able to lift  leg independently and hold equal to or more than 3 seconds   Total Score 41     Timed Up and Go Test   Normal TUG (seconds) 11.93   Manual TUG (seconds) 19.75   Cognitive TUG (seconds) 52.25                     [facility] Adult PT Treatment/Exercise - 04/24/17 1109      Knee/Hip Exercises: Aerobic   Nustep level 6 x 6 min           PWR [facility]) - 04/24/17 1109    PWR! exercises Moves in supine   PWR! Up x10   PWR! Rock [employer] Twist x20   PWR! Step x20               PT Short Term Goals - 04/24/17 1113      PT SHORT TERM GOAL #1   Title Patient to be independent with initial HEP (04/21/16)   Status Achieved     PT SHORT TERM GOAL #2   Title Patient to improve Berg Balance score to >/=40/56 demonstrating improved  functional mobility and balance (04/21/17)   Status Achieved           PT Long Term Goals - 04/24/17 1151      PT LONG TERM GOAL #1   Title Patient to be independent with advanced HEP (05/19/17)   Status On-going     PT LONG TERM GOAL #2   Title Patient to improve Berg Balance score to >/= 51/56 demonstrating improved functional mobility and balance (05/19/17)   Status On-going     PT LONG TERM GOAL #3   Title Patient to improve TUG to <13 seconds demonstrating reduced fall risk (05/19/17)   Status Achieved     PT LONG TERM GOAL #4   Title Patient to improve 5 times sit to stand by >/= 10 seconds (05/19/17)   Status Achieved     PT LONG TERM GOAL #5   Title Patient to ambulate > 500 feet on various indoor/outdoor surfaces with Mod I with no LOB and good heel toe gait pattern demonstrating improved funcitonal mobility and overall balance (05/19/17)   Status On-going               Plan - 04/24/17 1115    Clinical Impression Statement Pt demonstrating significant progress with PT to date. Improvements noted in standardized tests with Berg score increased from 31/56 to 41/56 and normal TUG decreased to 11.93 sec, both indicating a decreasing risk for falls. Pt continues to demonstrate tendency for forward flexed posture with gait accompanied by short shuffling gait but able to correct posture with increased step length and improved foot clearance with cues from PT. Pt continues to have good potential to benefit from further skilled PT to improve balance and facilitate more fluid movement patterns including normalized gait pattern.   Rehab Potential Good   PT Treatment/Interventions ADLs/Self Care Home Management;Cryotherapy;Electrical Stimulation;Moist Heat;Ultrasound;Neuromuscular re-education;Balance training;Therapeutic exercise;Therapeutic activities;Functional mobility training;Stair training;Gait training;Patient/family education;Manual techniques;Vasopneumatic Device;Taping    PT Next Visit Plan LE stretching; general strengthening & balance; review supine, seated, standing and fucntional PWR! Moves and progress to other positions as tolerated; review of OTAGO as needed   Consulted and Agree with Plan of Care Patient;Family member/caregiver      Patient will benefit from skilled therapeutic intervention in order to improve the following deficits and impairments:  Abnormal gait, Decreased activity tolerance, Decreased balance,  Decreased mobility, Decreased strength, Difficulty walking  Visit Diagnosis: Other abnormalities of gait and mobility  Unsteadiness on feet  Repeated falls  Muscle weakness (generalized)       G-Codes - May 16, 2017 1201    Functional Assessment Tool Used (Outpatient Only) FOTO = 34% (66% limited)   Functional Limitation Mobility: Walking and moving around   Mobility: Walking and Moving Around Current Status ([id number]) At least 60 percent but less than 80 percent impaired, limited or restricted   Mobility: Walking and Moving Around Goal Status [phone]) At least 20 percent but less than 40 percent impaired, limited or restricted      Problem List Patient Active Problem List   Diagnosis Date Noted  . 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 Agitation 11/18/2014  . Complicated migraine 65/53/7482  . Cognitive and behavioral changes 11/05/2014  . Altered mental status   . History of migraine headaches   . Acute CVA (cerebrovascular accident) ([place]) 10/27/2014  . Aphasia   . Thoracic aortic aneurysm ([place]) 11/04/2013  . Aortic insufficiency 11/04/2013  . Obstructive sleep apnea 11/04/2013    Percival Spanish, PT, MPT 04/25/2017, 11:19 AM  [facility] [address]  [place], [place], [address] Phone: [phone]   Fax:  [phone]  Name: Christopher Atkinson MRN: [id number] Date of Birth: [birth year]

## 2017-04-29 ENCOUNTER — Ambulatory Visit: Payer: PPO | Admitting: Physical Therapy

## 2017-04-29 DIAGNOSIS — R2681 Unsteadiness on feet: Secondary | ICD-10-CM

## 2017-04-29 DIAGNOSIS — M6281 Muscle weakness (generalized): Secondary | ICD-10-CM

## 2017-04-29 DIAGNOSIS — R2689 Other abnormalities of gait and mobility: Secondary | ICD-10-CM

## 2017-04-29 DIAGNOSIS — R296 Repeated falls: Secondary | ICD-10-CM

## 2017-04-29 NOTE — Therapy (Signed)
[facility] [address]  [place], [place], [address] Phone: [phone]   Fax:  [phone]  Physical Therapy Treatment  Patient Details  Name: Christopher Atkinson MRN: [id number] Date of Birth: 04-Jan-1941 Referring Provider: Dr. Balinda Quails  Encounter Date: 04/29/2017      PT End of Session - 04/29/17 1446    Visit Number 11   Number of Visits 16   Date for PT Re-Evaluation 05/19/17   PT Start Time 3664   PT Stop Time 1531   PT Time Calculation (min) 45 min   Activity Tolerance Patient tolerated treatment well   Behavior During Therapy [facility] for tasks assessed/performed      Past Medical History:  Diagnosis Date  . Aortic insufficiency   . Headache   . Obstructive sleep apnea    on CPAP  . Thoracic aortic aneurysm [facility])     Past Surgical History:  Procedure Laterality Date  . CHOLECYSTECTOMY    . GALLBLADDER SURGERY    . HERNIA REPAIR    . HERNIA REPAIR    . HERNIA REPAIR      There were no vitals filed for this visit.      Subjective Assessment - 04/29/17 1451    Subjective Pt's wife reports they have been walking more working on upright posture, [place].   Patient is accompained by: Family member   Pertinent History aortic valve anuerysm   Patient Stated Goals improve strength, walk better   Currently in Pain? Yes   Pain Score --  4-5/10   Pain Location Back   Pain Orientation Right;Mid   Pain Descriptors / Indicators Aching   Pain Type Chronic pain   Pain Onset More than a month ago                         [facility] Adult PT Treatment/Exercise - 04/29/17 1446      Knee/Hip Exercises: Aerobic   Nustep level 6 x 6 min           PWR [facility]) - 04/29/17 1507    PWR! exercises Moves in [place]! Up x10   PWR! Rock x10  modified: prone style reach d/t knee discomfort w/ back rock   PWR! Twist x10   PWR! Step x10   Comments modified  with hands on set of chair   PWR! Up x20   PWR! Rock [employer] Twist x20   PWR Step x20   Comments working in decreasing UE support and increasing hip/trunk extension   PWR! Step Through Forward/Back fwd, back & combined fwd/back x10 each               PT Short Term Goals - 04/24/17 1113      PT SHORT TERM GOAL #1   Title Patient to be independent with initial HEP (04/21/16)   Status Achieved     PT SHORT TERM GOAL #2   Title Patient to improve Berg Balance score to >/=40/56 demonstrating improved functional mobility and balance (04/21/17)   Status Achieved           PT Long Term Goals - 04/24/17 1151      PT LONG TERM GOAL #1   Title Patient to be independent with advanced HEP (05/19/17)   Status On-going     PT LONG TERM GOAL #2   Title Patient to improve  Berg Balance score to >/= 51/56 demonstrating improved functional mobility and balance (05/19/17)   Status On-going     PT LONG TERM GOAL #3   Title Patient to improve TUG to <13 seconds demonstrating reduced fall risk (05/19/17)   Status Achieved     PT LONG TERM GOAL #4   Title Patient to improve 5 times sit to stand by >/= 10 seconds (05/19/17)   Status Achieved     PT LONG TERM GOAL #5   Title Patient to ambulate > 500 feet on various indoor/outdoor surfaces with Mod I with no LOB and good heel toe gait pattern demonstrating improved funcitonal mobility and overall balance (05/19/17)   Status On-going               Plan - 04/29/17 1455    Clinical Impression Statement Introduced modified quadruped PWR! Moves using tall kneeling at chair with good tolerance for all motions except PWR! Rock (further modified to modified prone PWR! Rock). At wife's request reviewed standing PWR! Moves emphaisizing upright posture throughout all movement patterns and coordination of movement to acheive desired movement patterns. Reinforced upright posture with stepping/gait activities to promote normalized gait pattern.    Rehab Potential Good   PT Treatment/Interventions ADLs/Self Care Home Management;Cryotherapy;Electrical Stimulation;Moist Heat;Ultrasound;Neuromuscular re-education;Balance training;Therapeutic exercise;Therapeutic activities;Functional mobility training;Stair training;Gait training;Patient/family education;Manual techniques;Vasopneumatic Device;Taping   PT Next Visit Plan LE stretching; general strengthening & balance; review supine, seated, standing and fucntional PWR! Moves and progress to other positions as tolerated; review of OTAGO as needed   Consulted and Agree with Plan of Care Patient;Family member/caregiver      Patient will benefit from skilled therapeutic intervention in order to improve the following deficits and impairments:  Abnormal gait, Decreased activity tolerance, Decreased balance, Decreased mobility, Decreased strength, Difficulty walking  Visit Diagnosis: Other abnormalities of gait and mobility  Unsteadiness on feet  Repeated falls  Muscle weakness (generalized)     Problem List Patient Active Problem List   Diagnosis Date Noted  . 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 Agitation 11/18/2014  . Complicated migraine 16/09/9603  . Cognitive and behavioral changes 11/05/2014  . Altered mental status   . History of migraine headaches   . Acute CVA (cerebrovascular accident) ([place]) 10/27/2014  . Aphasia   . Thoracic aortic aneurysm ([place]) 11/04/2013  . Aortic insufficiency 11/04/2013  . Obstructive sleep apnea 11/04/2013    Percival Spanish, PT, MPT 04/29/2017, 4:30 PM  [facility] [address]  [place], [place], [address] Phone: [phone]   Fax:  [phone]  Name: Christopher Atkinson MRN: [id number] Date of Birth: 1941-09-16

## 2017-05-01 ENCOUNTER — Ambulatory Visit: Payer: PPO | Admitting: Physical Therapy

## 2017-05-01 DIAGNOSIS — R2681 Unsteadiness on feet: Secondary | ICD-10-CM

## 2017-05-01 DIAGNOSIS — R2689 Other abnormalities of gait and mobility: Secondary | ICD-10-CM | POA: Diagnosis not present

## 2017-05-01 DIAGNOSIS — R296 Repeated falls: Secondary | ICD-10-CM

## 2017-05-01 DIAGNOSIS — M6281 Muscle weakness (generalized): Secondary | ICD-10-CM

## 2017-05-01 NOTE — Therapy (Signed)
[facility] [address]  [place], [place], [address] Phone: [phone]   Fax:  [phone]  Physical Therapy Treatment  Patient Details  Name: Zachery Niswander MRN: [id number] Date of Birth: 10-Mar-1941 Referring Provider: Dr. Balinda Quails  Encounter Date: 05/01/2017      PT End of Session - 05/01/17 1532    Visit Number 12   Number of Visits 16   Date for PT Re-Evaluation 05/19/17   PT Start Time 1532   PT Stop Time 1615   PT Time Calculation (min) 43 min   Activity Tolerance Patient tolerated treatment well   Behavior During Therapy [facility] for tasks assessed/performed      Past Medical History:  Diagnosis Date  . Aortic insufficiency   . Headache   . Obstructive sleep apnea    on CPAP  . Thoracic aortic aneurysm [facility])     Past Surgical History:  Procedure Laterality Date  . CHOLECYSTECTOMY    . GALLBLADDER SURGERY    . HERNIA REPAIR    . HERNIA REPAIR    . HERNIA REPAIR      There were no vitals filed for this visit.      Subjective Assessment - 05/01/17 1536    Subjective Pt w/o new c/o or concerns.   Patient is accompained by: Family member   Pertinent History aortic valve anuerysm   Patient Stated Goals improve strength, walk better   Currently in Pain? Yes   Pain Score 3    Pain Location Back   Pain Orientation Right;Mid   Pain Descriptors / Indicators Aching   Pain Type Chronic pain                         [facility] Adult PT Treatment/Exercise - 05/01/17 1532      Knee/Hip Exercises: Aerobic   Nustep level 6 x 6 min           PWR [facility]) - 05/01/17 1532    PWR! Step Through Forward/Back fwd, back & combined fwd/back x10 each   PWR! Sit to Stand 2x10, 2nd set with alt PWR! Rock/Reach to target upon standing   PWR! Rock and Step Turn to colored targets on wall   PWR! Reaching In standing;Multi-directional;With PWR! twist and rock               PT Short Term  Goals - 04/24/17 1113      PT SHORT TERM GOAL #1   Title Patient to be independent with initial HEP (04/21/16)   Status Achieved     PT SHORT TERM GOAL #2   Title Patient to improve Berg Balance score to >/=40/56 demonstrating improved functional mobility and balance (04/21/17)   Status Achieved           PT Long Term Goals - 04/24/17 1151      PT LONG TERM GOAL #1   Title Patient to be independent with advanced HEP (05/19/17)   Status On-going     PT LONG TERM GOAL #2   Title Patient to improve Berg Balance score to >/= 51/56 demonstrating improved functional mobility and balance (05/19/17)   Status On-going     PT LONG TERM GOAL #3   Title Patient to improve TUG to <13 seconds demonstrating reduced fall risk (05/19/17)   Status Achieved     PT LONG TERM GOAL #4   Title Patient to improve 5 times sit to stand  by >/= 10 seconds (05/19/17)   Status Achieved     PT LONG TERM GOAL #5   Title Patient to ambulate > 500 feet on various indoor/outdoor surfaces with Mod I with no LOB and good heel toe gait pattern demonstrating improved funcitonal mobility and overall balance (05/19/17)   Status On-going               Plan - 05/01/17 1532    Clinical Impression Statement Pt noting fatigue after last visit, but no increased pain/soreness. Continued focus on upright posture with functional PWR! Moves incorporating fwd & back steps, Step + Twist/Rock & Reach starting from standing initially, then incorporating PWR! Sit to Stand prior to Step + Twist/Rock & Reach. Pt requiring intermittent cues to reset upright before and after moves.   Rehab Potential Good   PT Treatment/Interventions ADLs/Self Care Home Management;Cryotherapy;Electrical Stimulation;Moist Heat;Ultrasound;Neuromuscular re-education;Balance training;Therapeutic exercise;Therapeutic activities;Functional mobility training;Stair training;Gait training;Patient/family education;Manual techniques;Vasopneumatic Device;Taping    PT Next Visit Plan LE stretching; general strengthening & balance; review supine, seated, standing and fucntional PWR! Moves and progress to other positions as tolerated; review of OTAGO as needed   Consulted and Agree with Plan of Care Patient;Family member/caregiver   Family Member Consulted wife      Patient will benefit from skilled therapeutic intervention in order to improve the following deficits and impairments:  Abnormal gait, Decreased activity tolerance, Decreased balance, Decreased mobility, Decreased strength, Difficulty walking  Visit Diagnosis: Other abnormalities of gait and mobility  Unsteadiness on feet  Repeated falls  Muscle weakness (generalized)     Problem List Patient Active Problem List   Diagnosis Date Noted  . 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 Agitation 11/18/2014  . Complicated migraine 33/58/2518  . Cognitive and behavioral changes 11/05/2014  . Altered mental status   . History of migraine headaches   . Acute CVA (cerebrovascular accident) ([place]) 10/27/2014  . Aphasia   . Thoracic aortic aneurysm ([place]) 11/04/2013  . Aortic insufficiency 11/04/2013  . Obstructive sleep apnea 11/04/2013    Percival Spanish, PT, MPT 05/01/2017, 4:31 PM  [facility] [address]  [place], [place], [address] Phone: [phone]   Fax:  [phone]  Name: Takao Lizer MRN: [id number] Date of Birth: 11/03/1941

## 2017-05-06 ENCOUNTER — Ambulatory Visit: Payer: PPO | Admitting: Physical Therapy

## 2017-05-08 ENCOUNTER — Ambulatory Visit: Payer: PPO | Admitting: Physical Therapy

## 2017-05-13 ENCOUNTER — Ambulatory Visit: Payer: PPO | Admitting: Physical Therapy

## 2017-05-13 DIAGNOSIS — M6281 Muscle weakness (generalized): Secondary | ICD-10-CM

## 2017-05-13 DIAGNOSIS — R2681 Unsteadiness on feet: Secondary | ICD-10-CM

## 2017-05-13 DIAGNOSIS — R2689 Other abnormalities of gait and mobility: Secondary | ICD-10-CM | POA: Diagnosis not present

## 2017-05-13 DIAGNOSIS — R296 Repeated falls: Secondary | ICD-10-CM

## 2017-05-13 NOTE — Therapy (Signed)
[facility] [address]  [place], [place], [address] Phone: [phone]   Fax:  [phone]  Physical Therapy Treatment  Patient Details  Name: Christopher Atkinson MRN: [id number] Date of Birth: [birth year] Referring Provider: Dr. Balinda Quails  Encounter Date: 05/13/2017      PT End of Session - 05/13/17 1111    Visit Number 13   Number of Visits 16   Date for PT Re-Evaluation 05/19/17   PT Start Time 1102   PT Stop Time 1142   PT Time Calculation (min) 40 min   Activity Tolerance Patient tolerated treatment well   Behavior During Therapy [facility] for tasks assessed/performed      Past Medical History:  Diagnosis Date  . Aortic insufficiency   . Headache   . Obstructive sleep apnea    on CPAP  . Thoracic aortic aneurysm [facility])     Past Surgical History:  Procedure Laterality Date  . CHOLECYSTECTOMY    . GALLBLADDER SURGERY    . HERNIA REPAIR    . HERNIA REPAIR    . HERNIA REPAIR      There were no vitals filed for this visit.      Subjective Assessment - 05/13/17 1109    Subjective Does still report some lower R side pain from fall, no other complaints   Patient is accompained by: Family member   Pertinent History aortic valve anuerysm   Patient Stated Goals improve strength, walk better   Currently in Pain? Yes   Pain Score 4    Pain Location --  Trunk/rib area   Pain Orientation Right;Lateral                         [facility] Adult PT Treatment/Exercise - 05/13/17 0001      Neuro Re-ed    Neuro Re-ed Details  standing on AirEx reaching and tossing bags x 12 each side (CGA from PT); forward stepping over obstacles 4 x 6; lateral stepping over obstacles 2 x 6 each direction; sit to stand from low box x 15 - minimal use of hands; sit to stand with walking over 8" step x 10 - VC for posture and to slow movements;     Knee/Hip Exercises: Aerobic   Nustep level 6 x 6 min (B UE/B LE)                   PT Short Term Goals - 04/24/17 1113      PT SHORT TERM GOAL #1   Title Patient to be independent with initial HEP (04/21/16)   Status Achieved     PT SHORT TERM GOAL #2   Title Patient to improve Berg Balance score to >/=40/56 demonstrating improved functional mobility and balance (04/21/17)   Status Achieved           PT Long Term Goals - 04/24/17 1151      PT LONG TERM GOAL #1   Title Patient to be independent with advanced HEP (05/19/17)   Status On-going     PT LONG TERM GOAL #2   Title Patient to improve Berg Balance score to >/= 51/56 demonstrating improved functional mobility and balance (05/19/17)   Status On-going     PT LONG TERM GOAL #3   Title Patient to improve TUG to <13 seconds demonstrating reduced fall risk (05/19/17)   Status Achieved     PT LONG TERM GOAL #4  Title Patient to improve 5 times sit to stand by >/= 10 seconds (05/19/17)   Status Achieved     PT LONG TERM GOAL #5   Title Patient to ambulate > 500 feet on various indoor/outdoor surfaces with Mod I with no LOB and good heel toe gait pattern demonstrating improved funcitonal mobility and overall balance (05/19/17)   Status On-going               Plan - 05/13/17 1144    Clinical Impression Statement Patient and wife stating that at this point hardestthing is just "getting up and going" with good balance. Work today on sit to stand as well as with initiating walking over steps and obstacles. VC and TC required throughout for posture and to slow movements to reduce fall risk. Good posture today with reaching up and over simulating PWR! reach with good carryover.    PT Treatment/Interventions ADLs/Self Care Home Management;Cryotherapy;Electrical Stimulation;Moist Heat;Ultrasound;Neuromuscular re-education;Balance training;Therapeutic exercise;Therapeutic activities;Functional mobility training;Stair training;Gait training;Patient/family education;Manual  techniques;Vasopneumatic Device;Taping   PT Next Visit Plan LE stretching; general strengthening & balance; review supine, seated, standing and fucntional PWR! Moves and progress to other positions as tolerated; review of OTAGO as needed   Consulted and Agree with Plan of Care Patient;Family member/caregiver      Patient will benefit from skilled therapeutic intervention in order to improve the following deficits and impairments:  Abnormal gait, Decreased activity tolerance, Decreased balance, Decreased mobility, Decreased strength, Difficulty walking  Visit Diagnosis: Other abnormalities of gait and mobility  Unsteadiness on feet  Repeated falls  Muscle weakness (generalized)     Problem List Patient Active Problem List   Diagnosis Date Noted  . 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 Agitation 11/18/2014  . Complicated migraine 81/09/3158  . Cognitive and behavioral changes 11/05/2014  . Altered mental status   . History of migraine headaches   . Acute CVA (cerebrovascular accident) ([place]) 10/27/2014  . Aphasia   . Thoracic aortic aneurysm ([place]) 11/04/2013  . Aortic insufficiency 11/04/2013  . Obstructive sleep apnea 11/04/2013     Lanney Gins, PT, DPT 05/13/17 1:21 PM   [facility] [address]  [place], [place], [address] Phone: [phone]   Fax:  [phone]  Name: Christopher Atkinson MRN: [id number] Date of Birth: [birth year]

## 2017-05-15 ENCOUNTER — Encounter: Payer: Self-pay | Admitting: Rehabilitation

## 2017-05-15 ENCOUNTER — Ambulatory Visit: Payer: PPO | Admitting: Rehabilitation

## 2017-05-15 DIAGNOSIS — M6281 Muscle weakness (generalized): Secondary | ICD-10-CM

## 2017-05-15 DIAGNOSIS — R293 Abnormal posture: Secondary | ICD-10-CM

## 2017-05-15 DIAGNOSIS — R2681 Unsteadiness on feet: Secondary | ICD-10-CM

## 2017-05-15 DIAGNOSIS — R296 Repeated falls: Secondary | ICD-10-CM

## 2017-05-15 DIAGNOSIS — R2689 Other abnormalities of gait and mobility: Secondary | ICD-10-CM | POA: Diagnosis not present

## 2017-05-15 NOTE — Therapy (Signed)
[facility] [address]  [place], [place], [address] Phone: [phone]   Fax:  [phone]  Physical Therapy Treatment  Patient Details  Name: Christopher Atkinson MRN: [id number] Date of Birth: 1940-12-30 Referring Provider: Dr. Balinda Quails  Encounter Date: 05/15/2017      PT End of Session - 05/15/17 1401    Visit Number 14   Number of Visits 16   Date for PT Re-Evaluation 05/19/17   PT Start Time 1318   PT Stop Time 1400   PT Time Calculation (min) 42 min   Activity Tolerance Patient tolerated treatment well      Past Medical History:  Diagnosis Date  . Aortic insufficiency   . Headache   . Obstructive sleep apnea    on CPAP  . Thoracic aortic aneurysm [facility])     Past Surgical History:  Procedure Laterality Date  . CHOLECYSTECTOMY    . GALLBLADDER SURGERY    . HERNIA REPAIR    . HERNIA REPAIR    . HERNIA REPAIR      There were no vitals filed for this visit.      Subjective Assessment - 05/15/17 1319    Subjective the side is doing fairly well today.    Currently in Pain? No/denies                         [facility] Adult PT Treatment/Exercise - 05/15/17 0001      Neuro Re-ed    Neuro Re-ed Details  6 cone fwd step over x 2 and lateral x 2 CGA, sit to stand from box x 10 with overhead reach, sit to stand with quick walk x 2ft large arm swing, x 4, standing and seated trunk rotation reaches x 5bil with vc/tc, in corner tandem stance bean bag toss x 10 R/L, SL stance work bil. gait around clinic x 2 with work on arm swing and step size     Knee/Hip Exercises: Aerobic   Nustep bike level 3x52min                  PT Short Term Goals - 04/24/17 1113      PT SHORT TERM GOAL #1   Title Patient to be independent with initial HEP (04/21/16)   Status Achieved     PT SHORT TERM GOAL #2   Title Patient to improve Berg Balance score to >/=40/56 demonstrating improved functional  mobility and balance (04/21/17)   Status Achieved           PT Long Term Goals - 04/24/17 1151      PT LONG TERM GOAL #1   Title Patient to be independent with advanced HEP (05/19/17)   Status On-going     PT LONG TERM GOAL #2   Title Patient to improve Berg Balance score to >/= 51/56 demonstrating improved functional mobility and balance (05/19/17)   Status On-going     PT LONG TERM GOAL #3   Title Patient to improve TUG to <13 seconds demonstrating reduced fall risk (05/19/17)   Status Achieved     PT LONG TERM GOAL #4   Title Patient to improve 5 times sit to stand by >/= 10 seconds (05/19/17)   Status Achieved     PT LONG TERM GOAL #5   Title Patient to ambulate > 500 feet on various indoor/outdoor surfaces with Mod I with no LOB and good heel toe gait  pattern demonstrating improved funcitonal mobility and overall balance (05/19/17)   Status On-going               Plan - 05/15/17 1402    Clinical Impression Statement Tolerated all well.  Needing vcs/tcs throughout to stand up tall and to increase arm swing and step size.  some LOB into the wall to the R with tandem and SL stance work.  Able to improve gait with vcs for step length and arm swing   PT Treatment/Interventions ADLs/Self Care Home Management;Cryotherapy;Electrical Stimulation;Moist Heat;Ultrasound;Neuromuscular re-education;Balance training;Therapeutic exercise;Therapeutic activities;Functional mobility training;Stair training;Gait training;Patient/family education;Manual techniques;Vasopneumatic Device;Taping   PT Next Visit Plan LE stretching; general strengthening & balance; review supine, seated, standing and fucntional PWR! Moves and progress to other positions as tolerated; review of OTAGO as needed      Patient will benefit from skilled therapeutic intervention in order to improve the following deficits and impairments:  Abnormal gait, Decreased activity tolerance, Decreased balance, Decreased mobility,  Decreased strength, Difficulty walking  Visit Diagnosis: Unsteadiness on feet  Repeated falls  Muscle weakness (generalized)  Abnormal posture     Problem List Patient Active Problem List   Diagnosis Date Noted  . 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 Agitation 11/18/2014  . Complicated migraine 54/65/6812  . Cognitive and behavioral changes 11/05/2014  . Altered mental status   . History of migraine headaches   . Acute CVA (cerebrovascular accident) ([place]) 10/27/2014  . Aphasia   . Thoracic aortic aneurysm ([place]) 11/04/2013  . Aortic insufficiency 11/04/2013  . Obstructive sleep apnea 11/04/2013    Stark Bray, DPT, CMP 05/15/2017, 2:08 PM  [facility] [address]  [place], [place], [address] Phone: [phone]   Fax:  [phone]  Name: Christopher Atkinson MRN: [id number] Date of Birth: 1941/05/21

## 2017-05-20 ENCOUNTER — Ambulatory Visit: Payer: PPO | Admitting: Physical Therapy

## 2017-05-20 DIAGNOSIS — R2689 Other abnormalities of gait and mobility: Secondary | ICD-10-CM | POA: Diagnosis not present

## 2017-05-20 DIAGNOSIS — M6281 Muscle weakness (generalized): Secondary | ICD-10-CM

## 2017-05-20 DIAGNOSIS — R2681 Unsteadiness on feet: Secondary | ICD-10-CM

## 2017-05-20 DIAGNOSIS — R296 Repeated falls: Secondary | ICD-10-CM

## 2017-05-20 NOTE — Therapy (Addendum)
[facility] [address]  [place], [place], [address] Phone: [phone]   Fax:  [phone]  Physical Therapy Treatment  Patient Details  Name: Christopher Atkinson MRN: [id number] Date of Birth: [birth year] Referring Provider: Dr. Balinda Quails  Encounter Date: 05/20/2017      PT End of Session - 05/20/17 1108    Visit Number 15   Number of Visits 16   Date for PT Re-Evaluation 05/19/17   PT Start Time 1103   PT Stop Time 1142   PT Time Calculation (min) 39 min   Activity Tolerance Patient tolerated treatment well   Behavior During Therapy [facility] for tasks assessed/performed      Past Medical History:  Diagnosis Date  . Aortic insufficiency   . Headache   . Obstructive sleep apnea    on CPAP  . Thoracic aortic aneurysm [facility])     Past Surgical History:  Procedure Laterality Date  . CHOLECYSTECTOMY    . GALLBLADDER SURGERY    . HERNIA REPAIR    . HERNIA REPAIR    . HERNIA REPAIR      There were no vitals filed for this visit.      Subjective Assessment - 05/20/17 1106    Subjective Went to the beach with wife over the weekend - doing well today   Patient is accompained by: Family member   Pertinent History aortic valve anuerysm   Patient Stated Goals improve strength, walk better   Currently in Pain? No/denies   Pain Score 0-No pain            [facility] PT Assessment - 05/20/17 0001      Standardized Balance Assessment   Standardized Balance Assessment Berg Balance Test     Berg Balance Test   Sit to Stand Able to stand without using hands and stabilize independently   Standing Unsupported Able to stand safely 2 minutes   Sitting with Back Unsupported but Feet Supported on Floor or Stool Able to sit safely and securely 2 minutes   Stand to Sit Sits safely with minimal use of hands   Transfers Able to transfer safely, minor use of hands   Standing Unsupported with Eyes Closed Able to stand 10 seconds  safely   Standing Ubsupported with Feet Together Able to place feet together independently and stand 1 minute safely   From Standing, Reach Forward with Outstretched Arm Can reach forward >12 cm safely (5")   From Standing Position, Pick up Object from Floor Able to pick up shoe safely and easily   From Standing Position, Turn to Look Behind Over each Shoulder Looks behind from both sides and weight shifts well   Turn 360 Degrees Able to turn 360 degrees safely but slowly   Standing Unsupported, Alternately Place Feet on Step/Stool Able to complete >2 steps/needs minimal assist   Standing Unsupported, One Foot in Front Able to take small step independently and hold 30 seconds   Standing on One Leg Able to lift leg independently and hold equal to or more than 3 seconds   Total Score 46                     [facility] Adult PT Treatment/Exercise - 05/20/17 0001      Ambulation/Gait   Ambulation Distance (Feet) 400 Feet   Gait Comments VC for posture, arm swing, as well as reduced foot drag and larger steps     Neuro Re-ed  Neuro Re-ed Details  review of process for safely changing pants (pull below waist, sit, take off, then re-apply), review of balance exercises standing in corner with chair to prevent falls. Education to continue independent practice of HEP      Knee/Hip Exercises: Aerobic   Nustep L7 x 8 minutes                  PT Short Term Goals - 04/24/17 1113      PT SHORT TERM GOAL #1   Title Patient to be independent with initial HEP (04/21/16)   Status Achieved     PT SHORT TERM GOAL #2   Title Patient to improve Berg Balance score to >/=40/56 demonstrating improved functional mobility and balance (04/21/17)   Status Achieved           PT Long Term Goals - 2017/06/17 1339      PT LONG TERM GOAL #1   Title Patient to be independent with advanced HEP (05/19/17)   Status Achieved     PT LONG TERM GOAL #2   Title Patient to improve Berg Balance score  to >/= 51/56 demonstrating improved functional mobility and balance (05/19/17)   Status Partially Met     PT LONG TERM GOAL #3   Title Patient to improve TUG to <13 seconds demonstrating reduced fall risk (05/19/17)   Status Achieved     PT LONG TERM GOAL #4   Title Patient to improve 5 times sit to stand by >/= 10 seconds (05/19/17)   Status Achieved     PT LONG TERM GOAL #5   Title Patient to ambulate > 500 feet on various indoor/outdoor surfaces with Mod I with no LOB and good heel toe gait pattern demonstrating improved funcitonal mobility and overall balance (05/19/17)   Status Achieved               Plan - June 17, 2017 1340    Clinical Impression Statement Christopher Atkinson has progressed well with PT, much improved Berg Balance as well as TUG and 5x sit to stand. Patient also more aware of general posture and body mechaincs with gait and transfers. Patient today reporting a fall last night, of which he reports trying to change pants and leaning forward causing his fall - no injury sustained or trauma to head/neck. Education with patient and wife today on continuaton of all OTAGO, [place]!, and general balance and strengthening activities with good carryover. Patient and wife both made aware of 30 day hold policy and to return for any further needs.    PT Treatment/Interventions ADLs/Self Care Home Management;Cryotherapy;Electrical Stimulation;Moist Heat;Ultrasound;Neuromuscular re-education;Balance training;Therapeutic exercise;Therapeutic activities;Functional mobility training;Stair training;Gait training;Patient/family education;Manual techniques;Vasopneumatic Device;Taping   PT Next Visit Plan LE stretching; general strengthening & balance; review supine, seated, standing and fucntional PWR! Moves and progress to other positions as tolerated; review of OTAGO as needed   Consulted and Agree with Plan of Care Patient;Family member/caregiver      Patient will benefit from skilled therapeutic  intervention in order to improve the following deficits and impairments:  Abnormal gait, Decreased activity tolerance, Decreased balance, Decreased mobility, Decreased strength, Difficulty walking  Visit Diagnosis: Other abnormalities of gait and mobility  Unsteadiness on feet  Repeated falls  Muscle weakness (generalized)       G-Codes - 06/17/17 1353    Functional Assessment Tool Used (Outpatient Only) FOTO: 63 (37% limited)   Functional Limitation Mobility: Walking and moving around   Mobility: Walking and Moving Around Goal Status (  [id number]) At least 20 percent but less than 40 percent impaired, limited or restricted   Mobility: Walking and Moving Around Discharge Status [phone]) At least 20 percent but less than 40 percent impaired, limited or restricted      Problem List Patient Active Problem List   Diagnosis Date Noted  . 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 Agitation 11/18/2014  . Complicated migraine 83/12/5994  . Cognitive and behavioral changes 11/05/2014  . Altered mental status   . History of migraine headaches   . Acute CVA (cerebrovascular accident) ([place]) 10/27/2014  . Aphasia   . Thoracic aortic aneurysm ([place]) 11/04/2013  . Aortic insufficiency 11/04/2013  . Obstructive sleep apnea 11/04/2013    Lanney Gins 05/20/2017, 1:55 PM  PHYSICAL THERAPY DISCHARGE SUMMARY  Visits from Start of Care: 15  Current functional level related to goals / functional outcomes: See above   Remaining deficits: See above   Education / Equipment: HEP  Plan: Patient agrees to discharge.  Patient goals were partially met. Patient is being discharged due to meeting the stated rehab goals.  ?????    Lanney Gins, PT, DPT 06/16/17 1:52 PM   [facility] [address]  [place], [place], [address] Phone: [phone]   Fax:  [phone]  Name: Christopher Atkinson MRN: [id number] Date of  Birth: 1941-09-19

## 2017-05-20 NOTE — Patient Instructions (Signed)
Balance: Eyes Open - Bilateral (Varied Surfaces)    Stand, feet shoulder width, eyes open. Maintain balance __15-30__ seconds. Repeat __5-10__ times per set. Do _2___ sets per session. Repeat on compliant surface: foam. STAND IN CORNER **start with feet together, progress to one foot in front **Can stand and add head turns

## 2017-05-22 ENCOUNTER — Ambulatory Visit: Payer: PPO | Admitting: Physical Therapy

## 2017-06-11 ENCOUNTER — Telehealth: Payer: Self-pay | Admitting: Cardiovascular Disease

## 2017-06-11 NOTE — Telephone Encounter (Signed)
Pt's wife calling regarding pt having swelling of hands, fingers, feet, and ankles-no wt gain-no SOB x 2 days-pls call [phone] or [phone]

## 2017-06-11 NOTE — Telephone Encounter (Signed)
Returned call to wife who reports the following: Patient has swelling in hand, finger, feet ankles for a few days No weight gain No shortness of breath Wife states he has difficulty sleeping/breathing -- patient has a lot of sinus draining that keeps him up Patient does not eat salty foods or food high in sodium Does not take diuretic, no record of HF but does have aortic insufficiency    Advised to weigh daily - same time each day, same amount of clothes, before eating/drinking Advised to seek ED eval for shortness of breath Advised will defer to MD to advise

## 2017-06-15 NOTE — Telephone Encounter (Signed)
Agree with recs.

## 2017-06-17 DIAGNOSIS — R0602 Shortness of breath: Secondary | ICD-10-CM | POA: Diagnosis not present

## 2017-06-17 DIAGNOSIS — M7989 Other specified soft tissue disorders: Secondary | ICD-10-CM | POA: Diagnosis not present

## 2017-06-17 NOTE — Progress Notes (Signed)
Christopher Atkinson at [facility] [address], [place], [place] [address] [phone]  Date:  06/18/2017   Name:  Christopher Atkinson   DOB:  05-Jun-1941   MRN:  [id number]  PCP:  Darreld Mclean, MD    Chief Complaint: Foot Swelling (c/o bilateral foot swelling x 2 weeks. )   History of Present Illness:  Christopher Atkinson is a 76 y.o. very pleasant male patient who presents with the following:  Seen by myself in April to establish care:  History of dementia, tremor, headaches, aortic insuf, OSA.  He also has a thoracic AA that is at the cusp of needing repair but they have hesitated to pursue this due to his other health issues  Pt of Dr Jaynee Eagles - neurology Gwenlyn Found- cardiology  They had been patients of Dr. Rex Kras in [place] but they are transferring here due to more convenient location They have been married for 41 years- they have 2 children, 6 grandchildren, and 3 great grands.  Their daughter lives down the street and their son is also nearly.    Christopher Atkinson reports that he worked in several different careers, and was also in [employer] in his youth.   He has noted some concerns about urination- he notes that he has to urinate frequently, and he may have accidents (urge incont)- he has noted this for several months if not longer.  About 2 weeks ago he was having more severe urinary sx in addition to malaise and chills. [facility] and [facility]- however he did not have a urine culture at that time. The abx seemed to help but now his sx (urinary only- not systemic) have worsened again b  They think he is all UTD on his immunization through the [place] in [place]. [facility] of [facility]  He is on flomax at this time  He did see a urologist at some point but it has been a long time  They are not sure if he was ever told that his prostate was enlarged He is still taking tamsulosin for his urine stream He does  not wear any pads or pull-ups for incont  His wife brings him in today due to swelling in his bilateral feet that started about 2 weeks ago.   Never had this in the past.   Right is worse than left No fluid weeping, redness or heat Feet are still swollen in the morning and do not really get worse through the day No SOB noted.  He has not really noted orthopnea, but his wife has noted a lot of post nasal drip which sometimes seems to make him choke and have a hard time breathing  Besides his feet he feels fine Feet do not hurt He denies any CP or SOB No fever No history of CHF  Echo 2015 Left ventricle: The cavity size was normal. Wall thickness was increased in a pattern of mild LVH. There was focal basal hypertrophy. Systolic function was normal. The estimated ejection fraction was in the range of 60% to 65%. Wall motion was normal; there were no regional wall motion abnormalities. Doppler parameters are consistent with abnormal left ventricular relaxation (grade 1 diastolic dysfunction). - Aortic valve: There was mild regurgitation  BP Readings from Last 3 Encounters:  06/18/17 (!) 105/57  04/21/17 124/60  03/27/17 116/72   Pulse Readings from Last 3 Encounters:  06/18/17 61  04/21/17 (!) 53  03/27/17 (!) 55   Wt Readings from Last 3 Encounters:  06/18/17 167 lb 3.2 oz (75.8 kg)  04/21/17 168 lb 9.6 oz (76.5 kg)  03/27/17 161 lb 12.8 oz (73.4 kg)     Patient Active Problem List   Diagnosis Date Noted  . 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 Agitation 11/18/2014  . Complicated migraine 37/16/9678  . Cognitive and behavioral changes 11/05/2014  . Altered mental status   . History of migraine headaches   . Acute CVA (cerebrovascular accident) ([place]) 10/27/2014  . Aphasia   . Thoracic aortic aneurysm ([place]) 11/04/2013  . Aortic insufficiency 11/04/2013  . Obstructive sleep apnea 11/04/2013    Past Medical History:  Diagnosis Date  .  Aortic insufficiency   . Headache   . Obstructive sleep apnea    on CPAP  . Thoracic aortic aneurysm [facility])     Past Surgical History:  Procedure Laterality Date  . CHOLECYSTECTOMY    . GALLBLADDER SURGERY    . HERNIA REPAIR    . HERNIA REPAIR    . HERNIA REPAIR      Social History  Substance Use Topics  . Smoking status: Former Research scientist (life sciences)  . Smokeless tobacco: Never Used     Comment: quit smoking when he was 76 years old  . Alcohol use No    Family History  Problem Relation Age of Onset  . Heart Problems Father        CHF  . Dementia Mother   . Stroke Brother   . Dementia Brother   . Parkinson's disease Brother   . Alzheimer's disease Brother   . Cancer Brother        Throat cancer    No Known Allergies  Medication list has been reviewed and updated.  Current Outpatient Prescriptions on File Prior to Visit  Medication Sig Dispense Refill  . aspirin 81 MG tablet Take 81 mg by mouth daily.    . butalbital-acetaminophen-caffeine (FIORICET, ESGIC) 50-325-40 MG tablet TAKE 1 TABLET BY MOUTH EVERY 6 HOURS AS NEEDED FOR HEADACHE( DO NOT TAKE MORE THAN 10 TABLETS PER MONTH) 10 tablet 5  . divalproex (DEPAKOTE ER) 500 MG 24 hr tablet Take 1 tablet (500 mg total) by mouth daily. 90 tablet 5  . donepezil (ARICEPT) 10 MG tablet Take 1 tablet (10 mg total) by mouth at bedtime. 90 tablet 5  . MAGNESIUM PO Take 1 tablet by mouth daily.    . memantine (NAMENDA XR) 28 MG CP24 24 hr capsule Take 1 capsule (28 mg total) by mouth daily. 30 capsule 5  . Multiple Vitamins-Minerals ([employer] SPECTRAVITE ADULT 50+ PO) Take 1 tablet by mouth daily.    . Omega-3 Fatty Acids (FISH OIL) 1000 MG CAPS Take 1 tablet by mouth 3 (three) times daily.     . Saw Palmetto 450 MG CAPS Take 1 capsule by mouth daily.    Marland Kitchen TAMSULOSIN HCL PO Take 10 mg by mouth daily.    Marland Kitchen tobramycin-dexamethasone (TOBRADEX) ophthalmic solution Place 1 drop into both eyes as needed.  0   No current facility-administered  medications on file prior to visit.     Review of Systems:  As per HPI- otherwise negative. No fever or chills   Physical Examination: Vitals:   06/18/17 1023  BP: (!) 105/57  Pulse: 61  Temp: 97.9 F (36.6 C)   Vitals:   06/18/17 1023  Weight: 167 lb 3.2 oz (75.8 kg)  Height: 5\' 10"  (  1.778 m)   Body mass index is 23.99 kg/m. Ideal Body Weight: Weight in (lb) to have BMI = 25: 173.9  GEN: WDWN, NAD, Non-toxic, A & O x 3, appears his normal self, here today with his wife [name] and his granddaughter  HEENT: Atraumatic, Normocephalic. Neck supple. No masses, No LAD. Ears and Nose: No external deformity. CV: RRR, No M/G/R. No JVD. No thrill. No extra heart sounds. PULM: CTA B, no wheezes, crackles, rhonchi. No retractions. No resp. distress. No accessory muscle use. ABD: S, NT, ND EXTR: No c/c.  He has soft, pitting edema of both feet and lower ankles, more so on the right.  Strong pulses are palpable and both feet are warm.  He endorses mild calf tenderness more so on the right NEURO Normal gait.  PSYCH: Normally interactive. Conversant. Not depressed or anxious appearing.  Calm demeanor.    Assessment and Plan: Foot swelling - Plan: CBC, Comprehensive metabolic panel, B Nat Peptide, DG Chest 2 View, US Venous Img Lower Bilateral, POCT urinalysis dipstick  SOB (shortness of breath) - Plan: B Nat Peptide  Here today with foot swelling - will eval for CHF, cirrhosis or renal problem, DVT today Obtain labs, CXR and LE doppler all today  Signed Lamar Blinks, MD Received his labs and x-ray/ doppler Called and LMOM for his wife [name]- all looks ok, will try them back  Called later in the evening and did not reach- will try again tomorrow Likely venous insufficiency but will be hard to treat with diuretics given his soft BP  Results for orders placed or performed in visit on 06/18/17  CBC  Result Value Ref Range   WBC 5.4 4.0 - 10.5 K/uL   RBC 4.81 4.22 - 5.81  Mil/uL   Platelets 208.0 150.0 - 400.0 K/uL   Hemoglobin 14.6 13.0 - 17.0 g/dL   HCT 43.9 39.0 - 52.0 %   MCV 91.3 78.0 - 100.0 fl   MCHC 33.3 30.0 - 36.0 g/dL   RDW 13.2 11.5 - 15.5 %  Comprehensive metabolic panel  Result Value Ref Range   Sodium 139 135 - 145 mEq/L   Potassium 4.0 3.5 - 5.1 mEq/L   Chloride 105 96 - 112 mEq/L   CO2 29 19 - 32 mEq/L   Glucose, Bld 91 70 - 99 mg/dL   BUN 13 6 - 23 mg/dL   Creatinine, Ser 0.66 0.40 - 1.50 mg/dL   Total Bilirubin 0.9 0.2 - 1.2 mg/dL   Alkaline Phosphatase 63 39 - 117 U/L   AST 20 0 - 37 U/L   ALT 17 0 - 53 U/L   Total Protein 6.6 6.0 - 8.3 g/dL   Albumin 3.9 3.5 - 5.2 g/dL   Calcium 9.4 8.4 - 10.5 mg/dL   GFR 124.37 >60.00 mL/min  B Nat Peptide  Result Value Ref Range   Pro B Natriuretic peptide (BNP) 110.0 (H) 0.0 - 100.0 pg/mL  POCT urinalysis dipstick  Result Value Ref Range   Color, UA yellow yellow   Clarity, UA clear clear   Glucose, UA negative negative mg/dL   Bilirubin, UA negative negative   Ketones, POC UA negative negative mg/dL   Spec Grav, UA >=1.030 (A) 1.010 - 1.025   Blood, UA negative negative   pH, UA 6.0 5.0 - 8.0   Protein Ur, POC negative negative mg/dL   Urobilinogen, UA 0.2 0.2 or 1.0 E.U./dL   Nitrite, UA Negative Negative   Leukocytes, UA Negative Negative  Dg Chest 2 View  Result Date: 06/18/2017 CLINICAL DATA:  Bilateral lower extremity swelling. EXAM: CHEST  2 VIEW COMPARISON:  Chest x-ray dated 07/26/2015 FINDINGS: Heart size and pulmonary vascularity are normal. Lungs are clear. No effusions. No acute bone abnormality. IMPRESSION: No active cardiopulmonary disease. Electronically Signed   By: Lorriane Shire M.D.   On: 06/18/2017 15:26   US Venous Img Lower Bilateral  Result Date: 06/18/2017 CLINICAL DATA:  Bilateral foot swelling for 3 weeks EXAM: BILATERAL LOWER EXTREMITY VENOUS DOPPLER ULTRASOUND TECHNIQUE: Gray-scale sonography with graded compression, as well as color Doppler and  duplex ultrasound were performed to evaluate the lower extremity deep venous systems from the level of the common femoral vein and including the common femoral, femoral, profunda femoral, popliteal and calf veins including the posterior tibial, peroneal and gastrocnemius veins when visible. The superficial great saphenous vein was also interrogated. Spectral Doppler was utilized to evaluate flow at rest and with distal augmentation maneuvers in the common femoral, femoral and popliteal veins. COMPARISON:  None. FINDINGS: RIGHT LOWER EXTREMITY Common Femoral Vein: No evidence of thrombus. Normal compressibility, respiratory phasicity and response to augmentation. Saphenofemoral Junction: No evidence of thrombus. Normal compressibility and flow on color Doppler imaging. Profunda Femoral Vein: No evidence of thrombus. Normal compressibility and flow on color Doppler imaging. Femoral Vein: No evidence of thrombus. Normal compressibility, respiratory phasicity and response to augmentation. Popliteal Vein: No evidence of thrombus. Normal compressibility, respiratory phasicity and response to augmentation. Calf Veins: No evidence of thrombus. Normal compressibility and flow on color Doppler imaging. Superficial Great Saphenous Vein: No evidence of thrombus. Normal compressibility and flow on color Doppler imaging. Venous Reflux:  None. Other Findings:  Popliteal fossa cyst measures 2.8 x 2.1 x 1.0 cm. LEFT LOWER EXTREMITY Common Femoral Vein: No evidence of thrombus. Normal compressibility, respiratory phasicity and response to augmentation. Saphenofemoral Junction: No evidence of thrombus. Normal compressibility and flow on color Doppler imaging. Profunda Femoral Vein: No evidence of thrombus. Normal compressibility and flow on color Doppler imaging. Femoral Vein: No evidence of thrombus. Normal compressibility, respiratory phasicity and response to augmentation. Popliteal Vein: No evidence of thrombus. Normal  compressibility, respiratory phasicity and response to augmentation. Calf Veins: No evidence of thrombus. Normal compressibility and flow on color Doppler imaging. Superficial Great Saphenous Vein: No evidence of thrombus. Normal compressibility and flow on color Doppler imaging. Venous Reflux:  None. Other Findings:  None. IMPRESSION: No evidence of DVT within either lower extremity. Electronically Signed   By: Rolm Baptise M.D.   On: 06/18/2017 15:24

## 2017-06-18 ENCOUNTER — Ambulatory Visit (HOSPITAL_BASED_OUTPATIENT_CLINIC_OR_DEPARTMENT_OTHER)
Admission: RE | Admit: 2017-06-18 | Discharge: 2017-06-18 | Disposition: A | Discharge status: Home / Self Care | Payer: PPO | Source: Ambulatory Visit | Attending: Family Medicine | Admitting: Family Medicine

## 2017-06-18 ENCOUNTER — Ambulatory Visit (INDEPENDENT_AMBULATORY_CARE_PROVIDER_SITE_OTHER): Payer: PPO | Admitting: Family Medicine

## 2017-06-18 VITALS — BP 105/57 | HR 61 | Temp 97.9°F | Ht 70.0 in | Wt 167.2 lb

## 2017-06-18 DIAGNOSIS — R0602 Shortness of breath: Secondary | ICD-10-CM | POA: Diagnosis not present

## 2017-06-18 DIAGNOSIS — M7989 Other specified soft tissue disorders: Secondary | ICD-10-CM | POA: Diagnosis not present

## 2017-06-18 DIAGNOSIS — R918 Other nonspecific abnormal finding of lung field: Secondary | ICD-10-CM | POA: Diagnosis not present

## 2017-06-18 LAB — COMPREHENSIVE METABOLIC PANEL
ALK PHOS: 63 U/L (ref 39–117)
ALT: 17 U/L (ref 0–53)
AST: 20 U/L (ref 0–37)
Albumin: 3.9 g/dL (ref 3.5–5.2)
BILIRUBIN TOTAL: 0.9 mg/dL (ref 0.2–1.2)
BUN: 13 mg/dL (ref 6–23)
CO2: 29 mEq/L (ref 19–32)
CREATININE: 0.66 mg/dL (ref 0.40–1.50)
Calcium: 9.4 mg/dL (ref 8.4–10.5)
Chloride: 105 mEq/L (ref 96–112)
GFR: 124.37 mL/min (ref >60.00)
GLUCOSE: 91 mg/dL (ref 70–99)
Potassium: 4 mEq/L (ref 3.5–5.1)
Sodium: 139 mEq/L (ref 135–145)
TOTAL PROTEIN: 6.6 g/dL (ref 6.0–8.3)

## 2017-06-18 LAB — CBC
HCT: 43.9 % (ref 39.0–52.0)
Hemoglobin: 14.6 g/dL (ref 13.0–17.0)
MCHC: 33.3 g/dL (ref 30.0–36.0)
MCV: 91.3 fl (ref 78.0–100.0)
Platelets: 208 10*3/uL (ref 150.0–400.0)
RBC: 4.81 Mil/uL (ref 4.22–5.81)
RDW: 13.2 % (ref 11.5–15.5)
WBC: 5.4 10*3/uL (ref 4.0–10.5)

## 2017-06-18 LAB — POCT URINALYSIS DIP (MANUAL ENTRY)
BILIRUBIN UA: NEGATIVE
BILIRUBIN UA: NEGATIVE mg/dL
GLUCOSE UA: NEGATIVE mg/dL
Leukocytes, UA: NEGATIVE
Nitrite, UA: NEGATIVE
Protein Ur, POC: NEGATIVE mg/dL
RBC UA: NEGATIVE
Spec Grav, UA: >=1.03 — AB (ref 1.010–1.025)
Urobilinogen, UA: 0.2 E.U./dL
pH, UA: 6 (ref 5.0–8.0)

## 2017-06-18 LAB — BRAIN NATRIURETIC PEPTIDE: Pro B Natriuretic peptide (BNP): 110 pg/mL — ABNORMAL HIGH (ref 0.0–100.0)

## 2017-06-18 NOTE — Patient Instructions (Addendum)
Go ahead to lab, then please come back for your chest x-ray and ultrasound at 12:30 (imaging dept) Then you can go home and I will be in touch later today

## 2017-07-04 ENCOUNTER — Telehealth: Payer: Self-pay | Admitting: Family Medicine

## 2017-07-04 NOTE — Telephone Encounter (Signed)
Called and reached pt and his wife.  He is feeling well, swelling is under control.  They will let me know if anything needed

## 2017-07-30 ENCOUNTER — Ambulatory Visit (INDEPENDENT_AMBULATORY_CARE_PROVIDER_SITE_OTHER): Payer: PPO | Admitting: Neurology

## 2017-07-30 ENCOUNTER — Encounter: Payer: Self-pay | Admitting: Neurology

## 2017-07-30 VITALS — BP 115/67 | HR 50 | Ht 70.0 in | Wt 160.4 lb

## 2017-07-30 DIAGNOSIS — R1310 Dysphagia, unspecified: Secondary | ICD-10-CM | POA: Diagnosis not present

## 2017-07-30 DIAGNOSIS — G20 Parkinson's disease: Secondary | ICD-10-CM | POA: Diagnosis not present

## 2017-07-30 DIAGNOSIS — G20C Parkinsonism, unspecified: Secondary | ICD-10-CM | POA: Insufficient documentation

## 2017-07-30 MED ORDER — CARBIDOPA-LEVODOPA 25-100 MG PO TABS: ORAL_TABLET | ORAL | 6 refills | Status: DC

## 2017-07-30 NOTE — Patient Instructions (Addendum)
Remember to drink plenty of fluid, eat healthy meals and do not skip any meals. Try to eat protein with a every meal and eat a healthy snack such as fruit or nuts in between meals. Try to keep a regular sleep-wake schedule and try to exercise daily, particularly in the form of walking, 20-30 minutes a day, if you can.   As far as your medications are concerned, I would like to suggest:   Carbidopa/Levodopa (Sinemet): Start with 1/2 tablet 3 times a day. In one week increase to a whole tablet three times a day. Try to separate Sinemet from food (especially protein-rich foods like meat, dairy, eggs) by about 30-60 mins - this will help the absorption of the medication. If you have some nausea with the medication, you can take it with some light food like crackers or ginger ale  -swallow study  I would like to see you back in 3 months, sooner if we need to. Please call us with any interim questions, concerns, problems, updates or refill requests.   Our phone number is [phone]. We also have an after hours call service for urgent matters and there is a physician on-call for urgent questions. For any emergencies you know to call 911 or go to the nearest emergency room  Carbidopa; Levodopa tablets What is this medicine? CARBIDOPA;LEVODOPA (kar bi DOE pa; lee voe DOE pa) is used to treat the symptoms of Parkinson's disease. This medicine may be used for other purposes; ask your health care provider or pharmacist if you have questions. COMMON BRAND NAME(S): Atamet, SINEMET What should I tell my health care provider before I take this medicine? They need to know if you have any of these conditions: -asthma or lung disease -depression or other mental illness -diabetes -glaucoma -heart disease, including history of a heart attack -irregular heart beat -kidney or liver disease -melanoma or suspicious skin lesions -stomach or intestine ulcers -an unusual or allergic reaction to levodopa,  carbidopa, other medicines, foods, dyes, or preservatives -pregnant or trying to get pregnant -breast-feeding How should I use this medicine? Take this medicine by mouth with a glass of water. Follow the directions on the prescription label. Take your doses at regular intervals. Do not take your medicine more often than directed. Do not stop taking except on the advice of your doctor or health care professional. Talk to your pediatrician regarding the use of this medicine in children. Special care may be needed. Overdosage: If you think you have taken too much of this medicine contact a poison control center or emergency room at once. NOTE: This medicine is only for you. Do not share this medicine with others. What if I miss a dose? If you miss a dose, take it as soon as you can. If it is almost time for your next dose, take only that dose. Do not take double or extra doses. What may interact with this medicine? Do not take this medicine with any of the following medications: -MAOIs like Marplan, Nardil, and Parnate -reserpine -tetrabenazine This medicine may also interact with the following medications: -alcohol -droperidol -entacapone -iron supplements or multivitamins with iron -isoniazid, INH -linezolid -medicines for depression, anxiety, or psychotic disturbances -medicines for high blood pressure -medicines for sleep -metoclopramide -papaverine -procarbazine -tedizolid -rasagiline -selegiline -tolcapone This list may not describe all possible interactions. Give your health care provider a list of all the medicines, herbs, non-prescription drugs, or dietary supplements you use. Also tell them if you smoke, drink alcohol, or use  illegal drugs. Some items may interact with your medicine. What should I watch for while using this medicine? Visit your doctor or health care professional for regular checks on your progress. It may be several weeks or months before you feel the full  benefits of this medicine. Continue to take your medicine on a regular schedule. Do not take any additional medicines for Parkinson's disease without first consulting with your health care provider. You may experience a wearing of effect prior to the time for your next dose of this medicine. You may also experience an on-off effect where the medicine apparently stops working for anything from a minute to several hours, then suddenly starts working again. Tell your doctor or health care professional if any of these symptoms happen to you. Your dose may need to be changed. A high protein diet can slow or prevent absorption of this medicine. Avoid high protein foods near the time of taking this medicine to help to prevent these problems. Take this medicine at least 30 minutes before eating or one hour after meals. You may want to eat higher protein foods later in the day or in small amounts. Discuss your diet with your doctor or health care professional or nutritionist. You may get drowsy or dizzy. Do not drive, use machinery, or do anything that needs mental alertness until you know how this drug affects you. Do not stand or sit up quickly, especially if you are an older patient. This reduces the risk of dizzy or fainting spells. Alcohol can make you more drowsy and dizzy. Avoid alcoholic drinks. If you find that you have sudden feelings of wanting to sleep during normal activities, like cooking, watching television, or while driving or riding in a car, you should contact your health care professional. If you are diabetic, this medicine may interfere with the accuracy of some tests for sugar or ketones in the urine (does not interfere with blood tests). Check with your doctor or health care professional before changing the dose of your diabetic medicine. This medicine may discolor the urine or sweat, making it look darker or red in color. This is of no cause for concern. However, this may stain clothing or  fabrics. There have been reports of increased sexual urges or other strong urges such as gambling while taking some medicines for Parkinson's disease. If you experience any of these urges while taking this medicine, you should report it to your health care provider as soon as possible. You should check your skin often for changes to moles and new growths while taking this medicine. Call your doctor if you notice any of these changes. What side effects may I notice from receiving this medicine? Side effects that you should report to your doctor or health care professional as soon as possible: -allergic reactions like skin rash, itching or hives, swelling of the face, lips, or tongue -anxiety, confusion, or nervousness -falling asleep during normal activities like driving -fast, irregular heartbeat -hallucination, loss of contact with reality -mood changes like aggressive behavior, depression -stomach pain -trouble passing urine -uncontrolled movements of the mouth, head, hands, feet, shoulders, eyelids or other unusual muscle movements Side effects that usually do not require medical attention (report to your doctor or health care professional if they continue or are bothersome): -headache -loss of appetite -muscle twitches -nausea, vomiting -nightmares, trouble sleeping -unusually weak or tired This list may not describe all possible side effects. Call your doctor for medical advice about side effects. You may report side  effects to FDA at 1-800-FDA-1088. Where should I keep my medicine? Keep out of the reach of children. Store at room temperature between 15 and 30 degrees C (59 and 86 degrees F). Protect from light. Throw away any unused medicine after the expiration date. NOTE: This sheet is a summary. It may not cover all possible information. If you have questions about this medicine, talk to your doctor, pharmacist, or health care provider.  2018 Elsevier/Gold Standard (2014-02-08  15:41:53)
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
## 2017-08-06 ENCOUNTER — Ambulatory Visit (HOSPITAL_COMMUNITY)
Admission: RE | Admit: 2017-08-06 | Discharge: 2017-08-06 | Disposition: A | Discharge status: Home / Self Care | Payer: PPO | Source: Ambulatory Visit | Attending: Neurology | Admitting: Neurology

## 2017-08-06 ENCOUNTER — Ambulatory Visit: Payer: PPO | Admitting: Neurology

## 2017-08-06 DIAGNOSIS — R1312 Dysphagia, oropharyngeal phase: Secondary | ICD-10-CM | POA: Diagnosis not present

## 2017-08-06 DIAGNOSIS — G4733 Obstructive sleep apnea (adult) (pediatric): Secondary | ICD-10-CM | POA: Diagnosis not present

## 2017-08-06 DIAGNOSIS — R1310 Dysphagia, unspecified: Secondary | ICD-10-CM | POA: Diagnosis not present

## 2017-08-06 DIAGNOSIS — G20 Parkinson's disease: Secondary | ICD-10-CM | POA: Insufficient documentation

## 2017-09-08 IMAGING — CT CT ANGIO CHEST
2 of 7 series · 17 of 46 positions shown · IV contrast (isovue)
Comparison: Chest CT - 11/25/2015; 11/11/2013; 02/13/2012

CLINICAL DATA: Routine follow-up thoracic aortic aneurysm.

EXAM:
CT ANGIOGRAPHY CHEST WITH CONTRAST
TECHNIQUE: Multidetector CT imaging of the chest was performed using the
standard protocol during bolus administration of intravenous
contrast. Multiplanar CT image reconstructions and MIPs were
obtained to evaluate the vascular anatomy.
CONTRAST:  100 cc Isovue 370

[Series 4: aorta 3.0 i31f 2 · axial · 0.79mm/px · z∈[-323,-29]mm · 14 of 108 slices shown]
[im 5/108  lung]
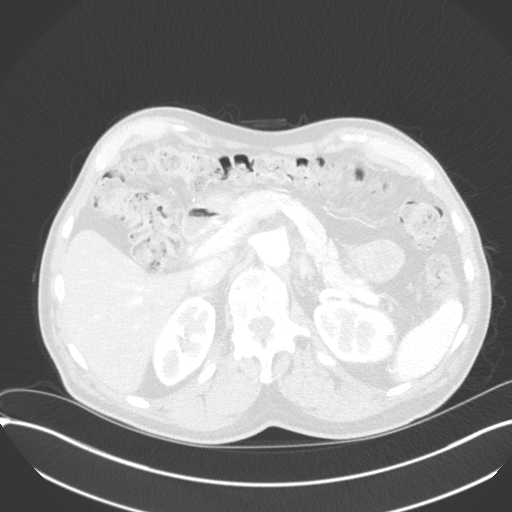
[im 13/108  soft-tissue]
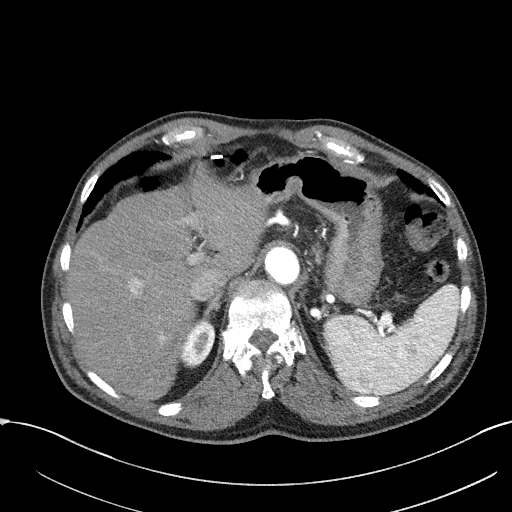
[im 21/108  lung]
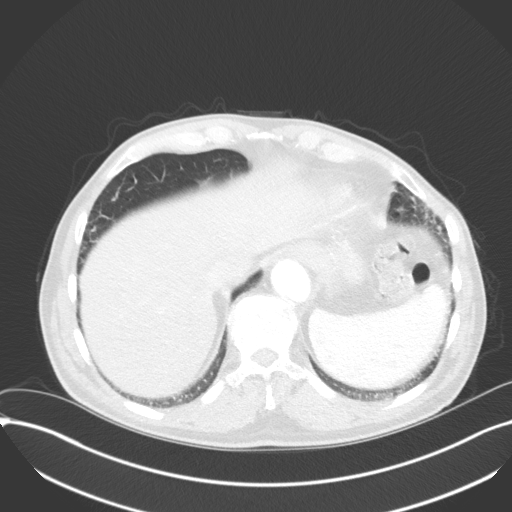
[im 29/108  soft-tissue]
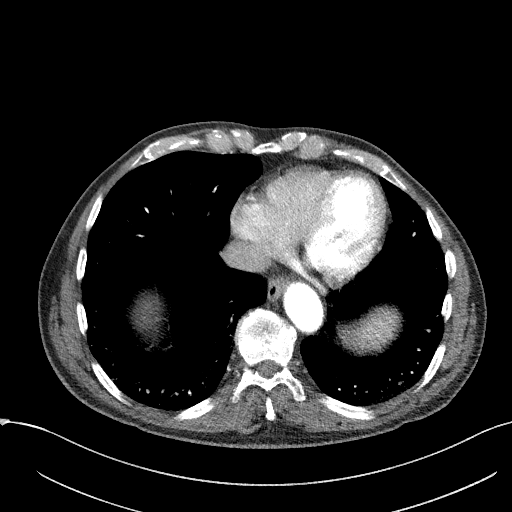
[im 38/108  lung]
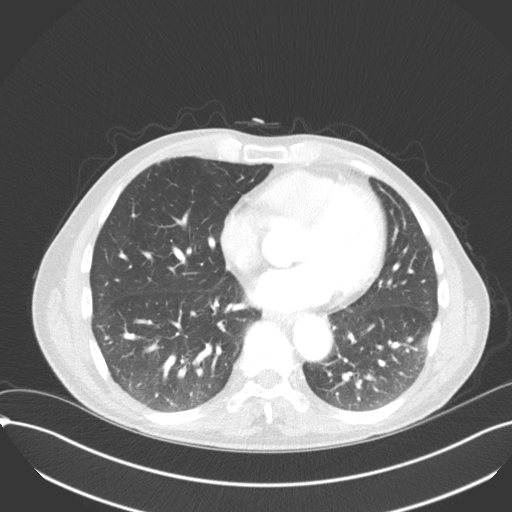
[im 42/108  soft-tissue]
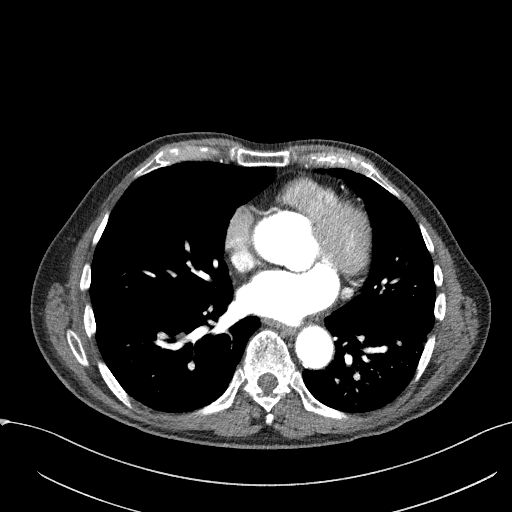
[im 50/108  lung]
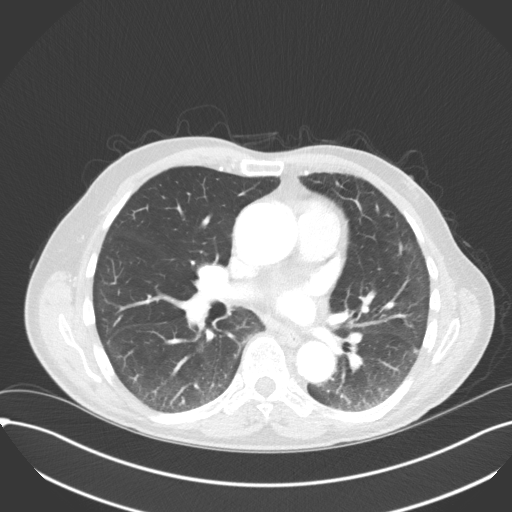
[im 58/108  soft-tissue]
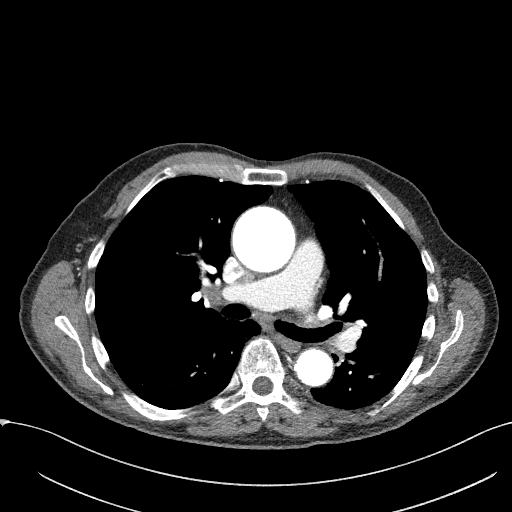
[im 66/108  lung]
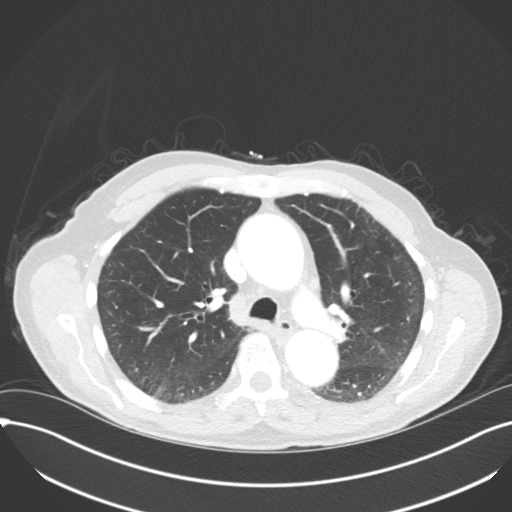
[im 70/108  soft-tissue]
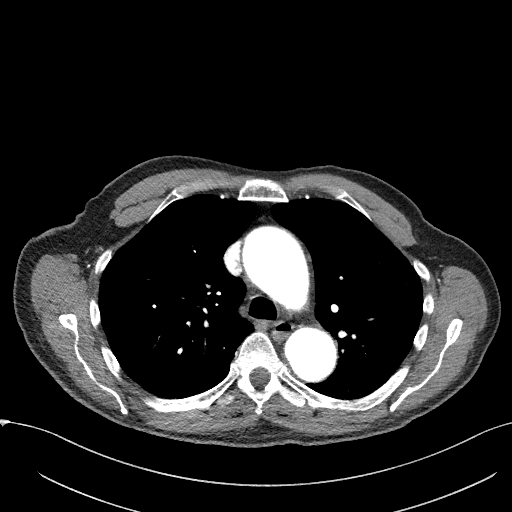
[im 79/108  lung]
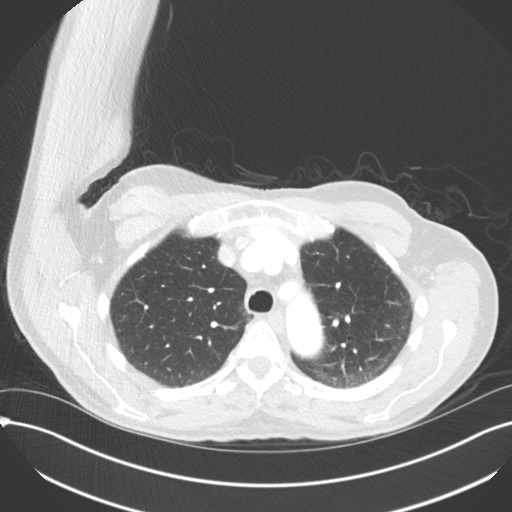
[im 87/108  soft-tissue]
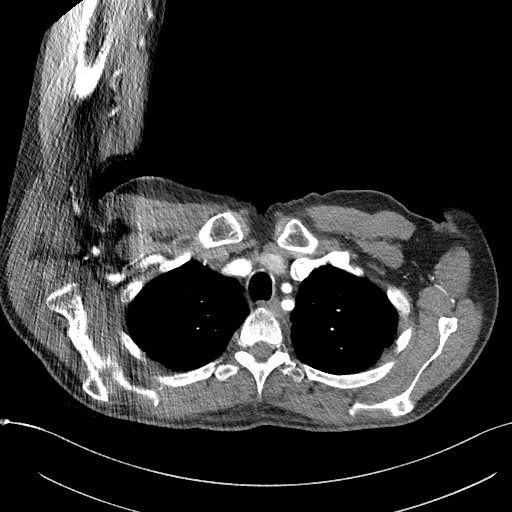
[im 95/108  lung]
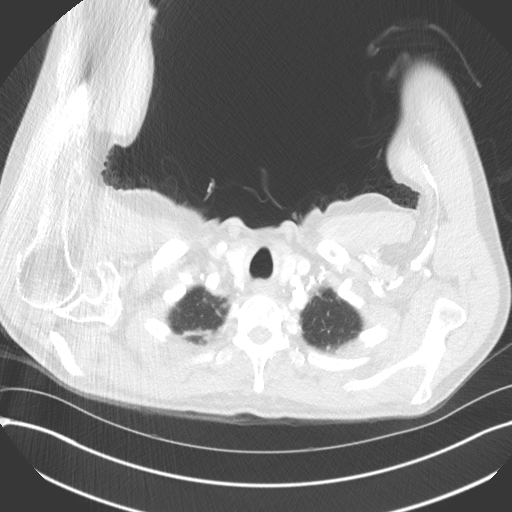
[im 103/108  soft-tissue]
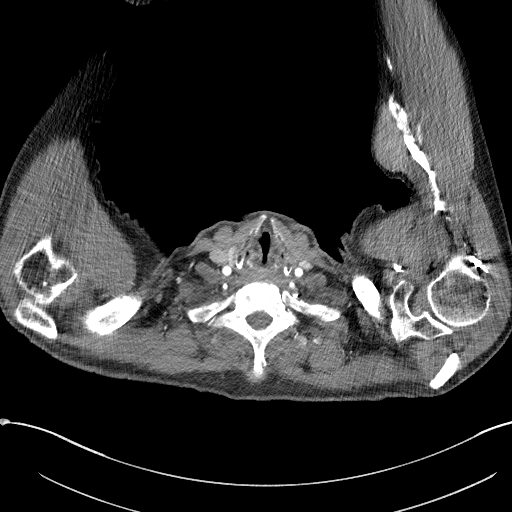

[Series 7: coronals · coronal · 0.65mm/px · 3 of 125 slices shown]
[im 32/125  soft-tissue]
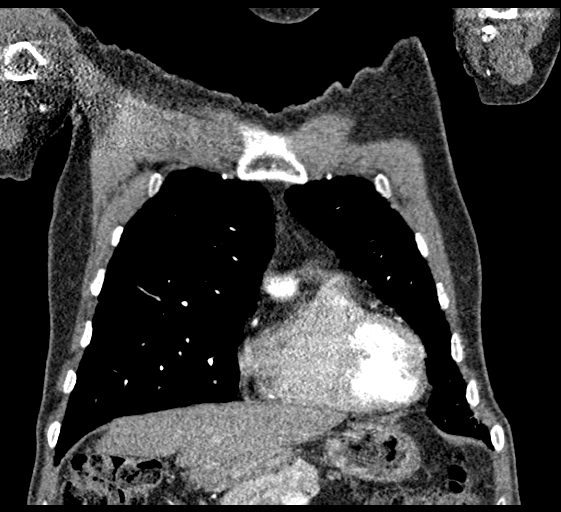
[im 63/125  soft-tissue]
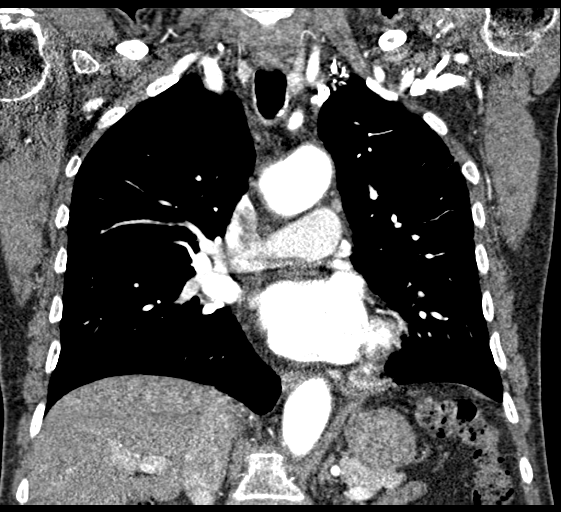
[im 94/125  soft-tissue]
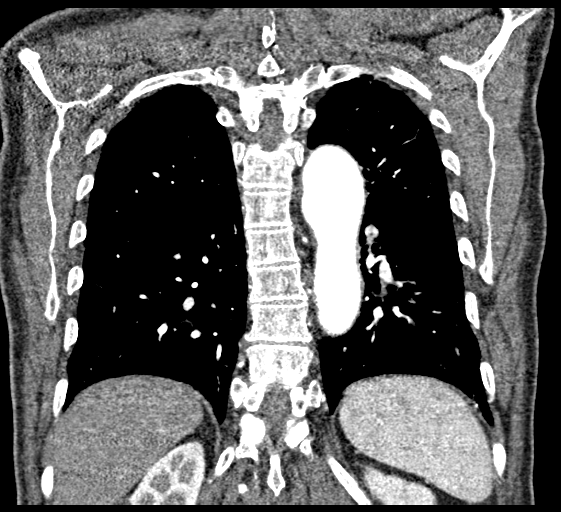

[17 of 46 positions shown; findings below may reference images not displayed]

FINDINGS: Cardiovascular: Unchanged fusiform aneurysmal dilatation of the
ascending thoracic aorta with tapering at the level of the aortic
arch and fusiform aneurysmal dilatation of the proximal descending
thoracic aorta with measurements as follows. No evidence of thoracic
aortic dissection or periaortic stranding.

Conventional configuration of the aortic arch. The branch vessels of
the aortic arch appear widely patent throughout their imaged course.

Normal heart size.  No pericardial effusion.

Normal caliber the main pulmonary artery. Although this examination
was not tailored for the evaluation the pulmonary arteries, there
are no discrete filling defects within the central pulmonary
arterial tree to suggest central pulmonary embolism.

-------------------------------------------------------------

Thoracic aortic measurements:

Sinotubular junction

41 mm as measured in greatest oblique coronal dimension.

Proximal ascending aorta

51 mm as measured in greatest oblique axial dimension at the level
of the main pulmonary artery (axial image 52, series 4), an
approximately 49 mm in greatest oblique coronal diameter (coronal
image 45, series 7), unchanged compared to the [DATE] examination

Aortic arch aorta

26 mm as measured in greatest oblique sagittal dimension (image 106,
series 8).

Proximal descending thoracic aorta

There is unchanged fusiform ectasia of the proximal descending
thoracic aorta measuring 42 mm in greatest oblique sagittal diameter
(image 109, series 8), unchanged compared to the [DATE] exam.

30 mm as measured in greatest oblique axial dimension at the level
of the main pulmonary artery (image 52, series 4), unchanged
compared to the [DATE] examination.

Distal descending thoracic aorta

20 mm as measured in greatest oblique axial dimension at the level
of the diaphragmatic hiatus.

Review of the MIP images confirms the above findings.

-------------------------------------------------------------

Mediastinum/Nodes: Prominent right suprahilar lymph node is
unchanged measuring 1 cm in greatest short axis diameter (51, series
4, similar to the [DATE] examination. No bulky mediastinal, hilar
axillary lymphadenopathy.

Lungs/Pleura: Ground-glass dependent subpleural atelectasis. No
focal airspace opacities. No pleural effusion.

The central pulmonary airways appear widely patent.

Punctate (approximately 7 mm) nodule within the right lung apex
(image 17, series 3 as well as the smaller approximately 4 mm)
nodule within the subpleural aspect of the superior segment of the
left lower lobe (image 56, series 5) appear unchanged compared to
remote examination performed [DATE]. No new or enlarging pulmonary
nodules.

Upper Abdomen: Early arterial phase evaluation of the upper abdomen
demonstrates sequela of prior cholecystectomy. Radiopaque pill
fragment is seen within the duodenum.

Musculoskeletal: No acute or aggressive osseous abnormalities.
Mild-to-moderate lower cervical and thoracic DDD. Regional soft
tissues appear normal. Normal appearance of the thyroid gland.

Review of the MIP images confirms the above findings.
IMPRESSION: 1. Stable uncomplicated fusiform aneurysmal dilatation of the
ascending and proximal descending thoracic aorta, measuring 5.1 cm
and 4.2 cm respectively, unchanged compared to the [DATE]
examination.
2. Right upper and left lower lobe pulmonary nodules, unchanged
since the [DATE] examination and thus of benign etiology.

## 2017-11-07 ENCOUNTER — Other Ambulatory Visit: Payer: Self-pay | Admitting: Neurology

## 2017-11-12 ENCOUNTER — Ambulatory Visit: Payer: PPO | Admitting: Adult Health

## 2017-11-27 ENCOUNTER — Encounter: Payer: Self-pay | Admitting: Adult Health

## 2017-11-27 ENCOUNTER — Ambulatory Visit: Payer: PPO | Admitting: Adult Health

## 2017-11-27 VITALS — BP 107/67 | HR 55 | Ht 70.0 in | Wt 164.0 lb

## 2017-11-27 DIAGNOSIS — R413 Other amnesia: Secondary | ICD-10-CM

## 2017-11-27 DIAGNOSIS — G20 Parkinson's disease: Secondary | ICD-10-CM | POA: Diagnosis not present

## 2017-11-27 DIAGNOSIS — G43009 Migraine without aura, not intractable, without status migrainosus: Secondary | ICD-10-CM | POA: Diagnosis not present

## 2017-11-27 DIAGNOSIS — Z5181 Encounter for therapeutic drug level monitoring: Secondary | ICD-10-CM | POA: Diagnosis not present

## 2017-11-27 NOTE — Patient Instructions (Signed)
Your Plan:  Continue Depakote for seizures Continue Aricept and Namenda for memory Continue sinemet watch for improvement of symptoms after dose. If your symptoms worsen or you develop new symptoms please let us know.   Thank you for coming to see [place] at [facility]. I hope we have been able to provide you high quality care today.  You may receive a patient satisfaction survey over the next few weeks. We would appreciate your feedback and comments so that we may continue to improve ourselves and the health of our patients.
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
## 2017-11-27 NOTE — Progress Notes (Signed)
Personally  participated in, made any corrections needed, and agree with history, physical, neuro exam,assessment and plan as stated above.    Oland Arquette, MD [facility] 

## 2017-11-28 LAB — COMPREHENSIVE METABOLIC PANEL
A/G RATIO: 1.5 (ref 1.2–2.2)
ALK PHOS: 82 IU/L (ref 39–117)
ALT: 26 IU/L (ref 0–44)
AST: 26 IU/L (ref 0–40)
Albumin: 4 g/dL (ref 3.5–4.8)
BUN/Creatinine Ratio: 22 (ref 10–24)
BUN: 14 mg/dL (ref 8–27)
Bilirubin Total: 0.6 mg/dL (ref 0.0–1.2)
CALCIUM: 9.4 mg/dL (ref 8.6–10.2)
CO2: 30 mmol/L — ABNORMAL HIGH (ref 20–29)
Chloride: 103 mmol/L (ref 96–106)
Creatinine, Ser: 0.65 mg/dL — ABNORMAL LOW (ref 0.76–1.27)
GFR calc Af Amer: 108 mL/min/{1.73_m2} (ref >59)
GFR, EST NON AFRICAN AMERICAN: 94 mL/min/{1.73_m2} (ref >59)
Globulin, Total: 2.6 g/dL (ref 1.5–4.5)
Glucose: 91 mg/dL (ref 65–99)
POTASSIUM: 4.2 mmol/L (ref 3.5–5.2)
Sodium: 143 mmol/L (ref 134–144)
Total Protein: 6.6 g/dL (ref 6.0–8.5)

## 2017-11-28 LAB — CBC WITH DIFFERENTIAL/PLATELET
BASOS ABS: 0 10*3/uL (ref 0.0–0.2)
BASOS: 1 %
EOS (ABSOLUTE): 0.1 10*3/uL (ref 0.0–0.4)
Eos: 1 %
Hematocrit: 43.5 % (ref 37.5–51.0)
Hemoglobin: 14.2 g/dL (ref 13.0–17.7)
IMMATURE GRANS (ABS): 0 10*3/uL (ref 0.0–0.1)
IMMATURE GRANULOCYTES: 0 %
LYMPHS: 18 %
Lymphocytes Absolute: 1.2 10*3/uL (ref 0.7–3.1)
MCH: 30.3 pg (ref 26.6–33.0)
MCHC: 32.6 g/dL (ref 31.5–35.7)
MCV: 93 fL (ref 79–97)
MONOS ABS: 0.7 10*3/uL (ref 0.1–0.9)
Monocytes: 10 %
NEUTROS PCT: 70 %
Neutrophils Absolute: 4.5 10*3/uL (ref 1.4–7.0)
PLATELETS: 228 10*3/uL (ref 150–379)
RBC: 4.69 x10E6/uL (ref 4.14–5.80)
RDW: 13.1 % (ref 12.3–15.4)
WBC: 6.5 10*3/uL (ref 3.4–10.8)

## 2017-11-28 LAB — VALPROIC ACID LEVEL: VALPROIC ACID LVL: 30 ug/mL — AB (ref 50–100)

## 2017-12-03 ENCOUNTER — Telehealth: Payer: Self-pay | Admitting: *Deleted

## 2017-12-03 NOTE — Telephone Encounter (Signed)
Spoke to wife, [name], and relayed that lab results for her husband, Christopher Atkinson were consistant with previous lab work, and will forward to his pcp as well, Dr. Janett Billow Copland.  She verbalized understanding.

## 2017-12-03 NOTE — Telephone Encounter (Signed)
-----   Message from Ward Givens, NP sent at 12/03/2017  9:30 AM EST ----- Lab work consistent with previous lab work. Please call patient. Forward results to PCP

## 2018-01-25 IMAGING — DX DG CHEST 2V
2 series · 2 of 2 positions shown · non-contrast
Comparison: Chest x-ray dated 07/26/2015

CLINICAL DATA: Bilateral lower extremity swelling.

EXAM:
CHEST  2 VIEW

[chest pa]
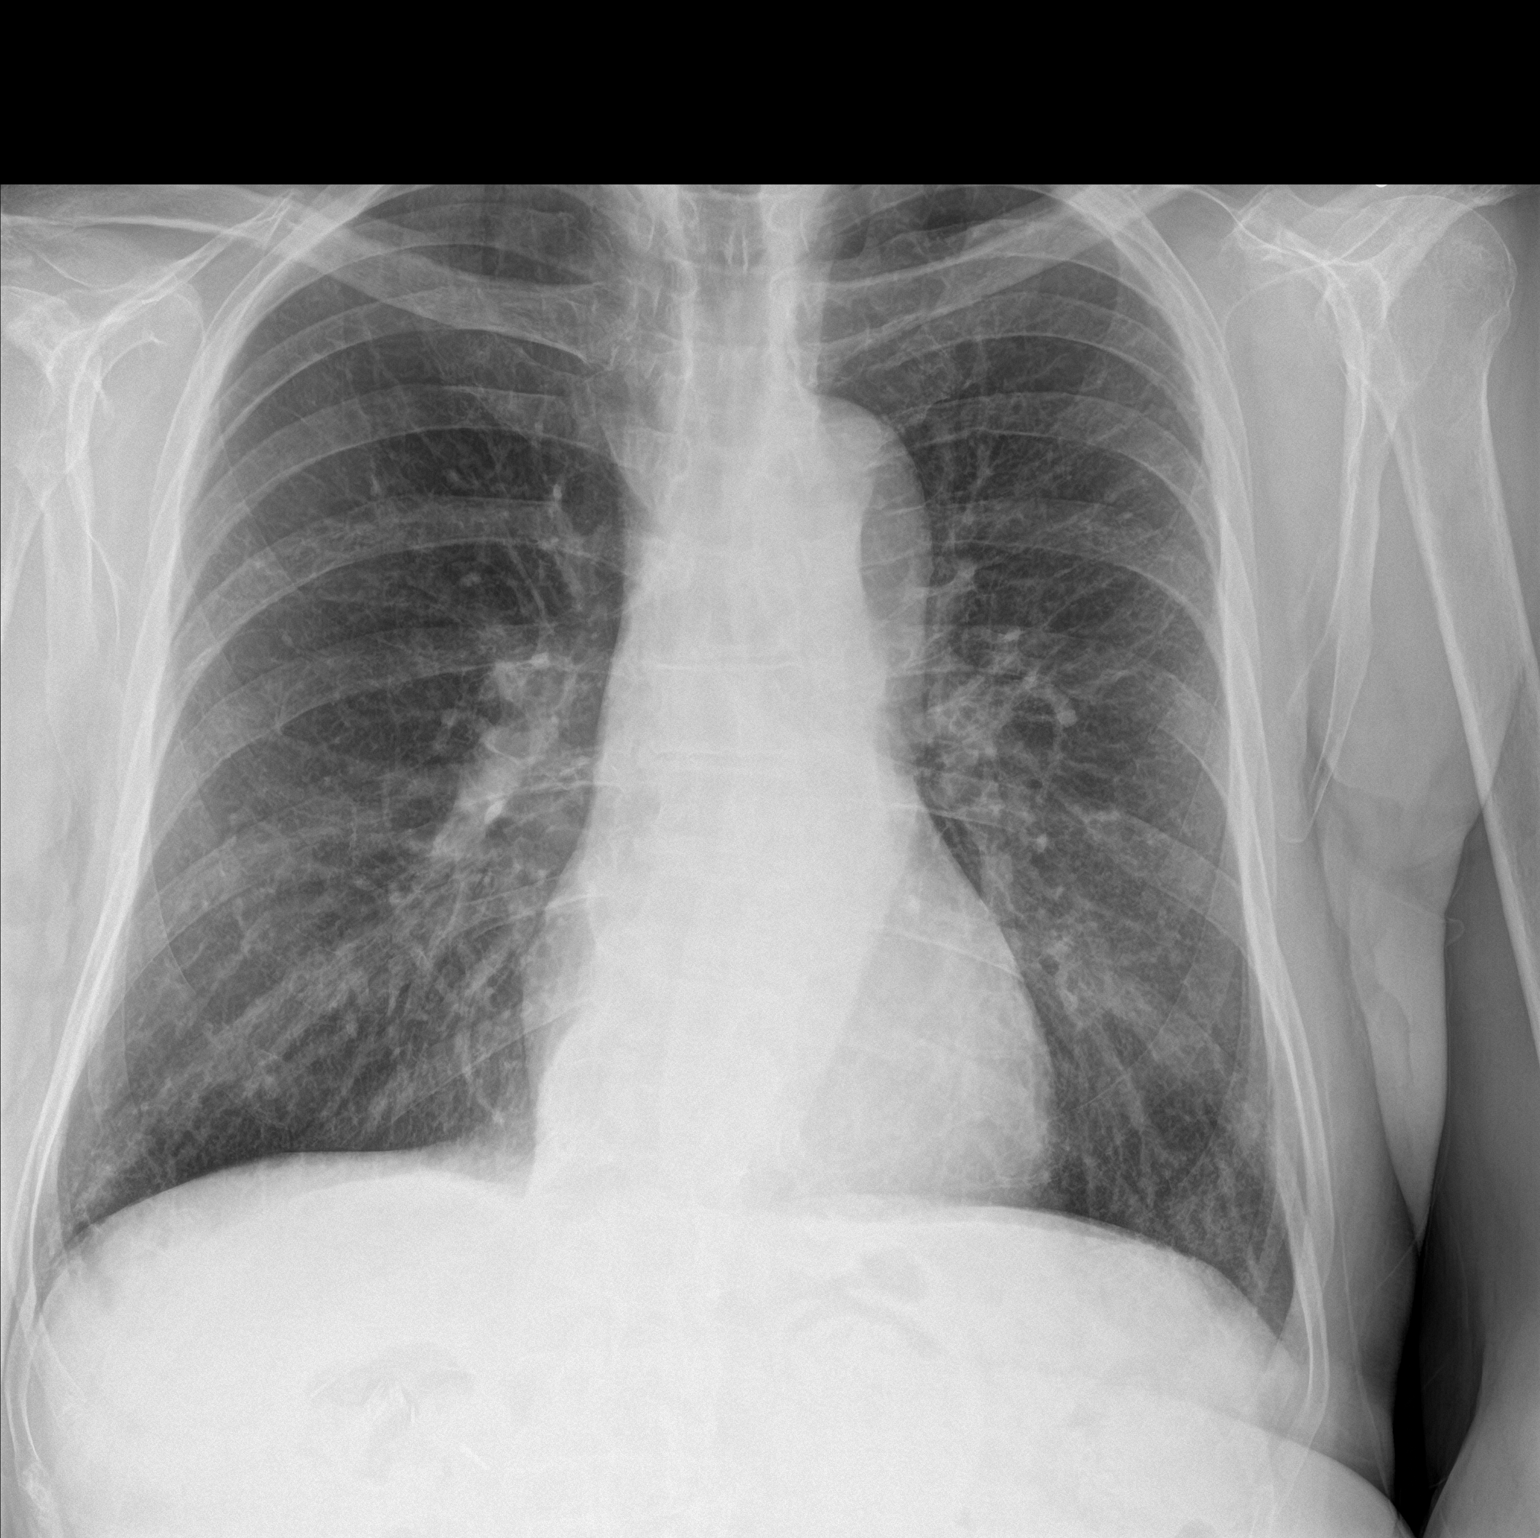

[chest lat]
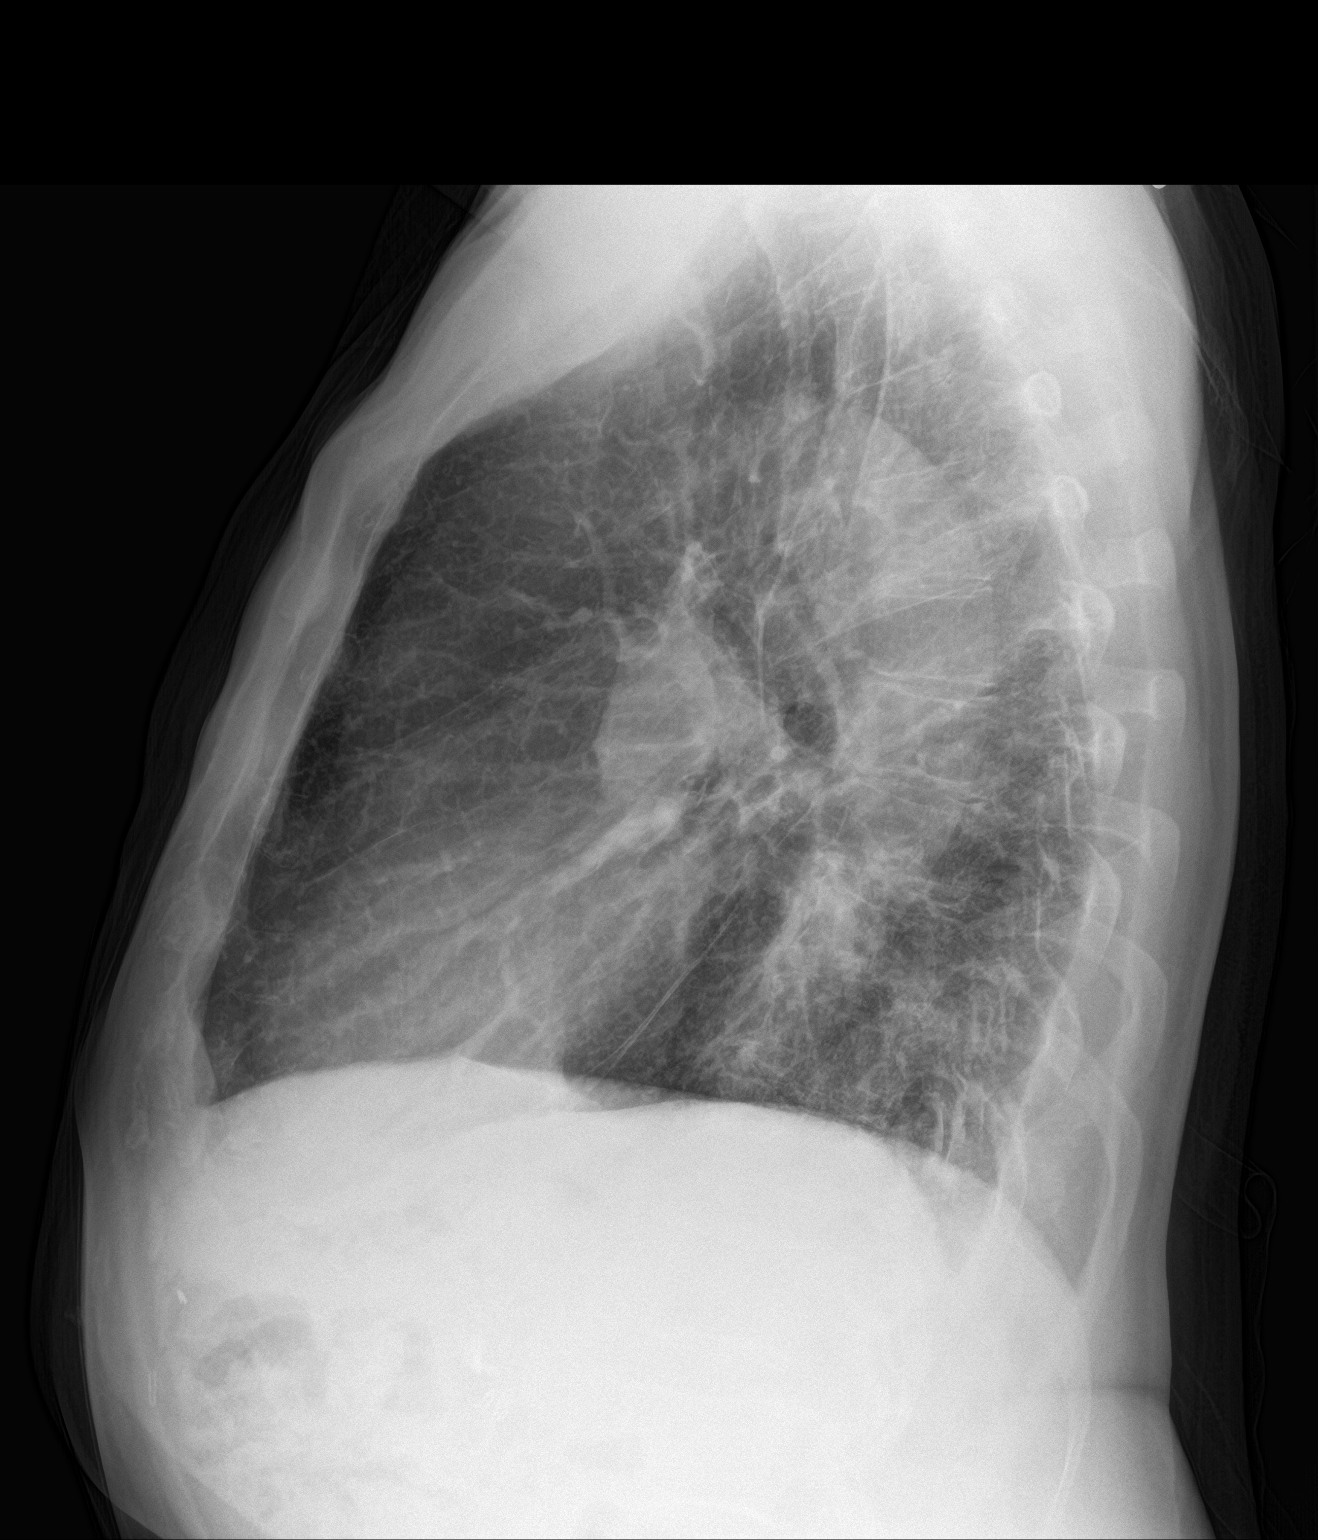

[2 of 2 positions shown; findings below may reference images not displayed]

FINDINGS: Heart size and pulmonary vascularity are normal. Lungs are clear. No
effusions. No acute bone abnormality.
IMPRESSION: No active cardiopulmonary disease.

## 2018-02-18 ENCOUNTER — Other Ambulatory Visit: Payer: Self-pay | Admitting: Neurology

## 2018-02-18 ENCOUNTER — Telehealth: Payer: Self-pay | Admitting: Neurology

## 2018-02-18 MED ORDER — DIVALPROEX SODIUM ER 500 MG PO TB24: 500.0000 mg | ORAL_TABLET | Freq: Every day | ORAL | 2 refills | Status: AC

## 2018-02-18 MED ORDER — DONEPEZIL HCL 10 MG PO TABS: 10.0000 mg | ORAL_TABLET | Freq: Every day | ORAL | 2 refills | Status: AC

## 2018-02-18 NOTE — Telephone Encounter (Signed)
Patient's wife requesting new Rx's for donepezil (ARICEPT) 10 MG tablet and divalproex (DEPAKOTE ER) 500 MG 24 hr tablet [facility] [address] in [place]. The patient no longer gets these medications from the [place].

## 2018-02-18 NOTE — Telephone Encounter (Signed)
I have completed this and will send them to the pharmacy listed below

## 2018-03-05 DIAGNOSIS — H01019 Ulcerative blepharitis unspecified eye, unspecified eyelid: Secondary | ICD-10-CM | POA: Diagnosis not present

## 2018-03-05 DIAGNOSIS — L719 Rosacea, unspecified: Secondary | ICD-10-CM | POA: Diagnosis not present

## 2018-03-13 ENCOUNTER — Ambulatory Visit: Payer: PPO | Admitting: Cardiovascular Disease

## 2018-03-13 ENCOUNTER — Encounter: Payer: Self-pay | Admitting: Cardiovascular Disease

## 2018-03-13 VITALS — BP 110/72 | HR 52 | Ht 70.5 in | Wt 165.0 lb

## 2018-03-13 DIAGNOSIS — I351 Nonrheumatic aortic (valve) insufficiency: Secondary | ICD-10-CM | POA: Diagnosis not present

## 2018-03-13 DIAGNOSIS — I712 Thoracic aortic aneurysm, without rupture, unspecified: Secondary | ICD-10-CM

## 2018-03-13 NOTE — Progress Notes (Signed)
03/13/2018 Tia Alert   03-25-1941  [id number]  Primary Physician Copland, Gay Filler, MD Primary Cardiologist: Lorretta Harp MD Garret Reddish, [place], [place]  HPI:  Christopher Atkinson is a 77 y.o.  mildly overweight married Caucasian male father of 2, grandfather and 6 grandchildren who recently retired along with his wife 01/23/16. I last saw him in the office 01/15/17.His cardiac risk factor profile is essentially benign. He was followed for thoracic aortic aneurysm by CT angiography annually, aortic insufficiency by 2-D echocardiography. He also has obstructive sleep apnea on CPAP. He denies chest pain or shortness of breath. His most recent lipid profile performed 10/28/14 revealed a total cholesterol of 128, LDL 79 and HDL of 35. His last CT angiogram performed 01/30/17 revealed a maximum dimension of 5.1 cm. 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     Current Meds  Medication Sig  . butalbital-acetaminophen-caffeine (FIORICET, ESGIC) 50-325-40 MG tablet TAKE 1 TABLET BY MOUTH EVERY 6 HOURS AS NEEDED FOR HEADACHE( DO NOT TAKE MORE THAN 10 TABLETS PER MONTH)  . divalproex (DEPAKOTE ER) 500 MG 24 hr tablet Take 1 tablet (500 mg total) by mouth daily.  Marland Kitchen donepezil (ARICEPT) 10 MG tablet Take 1 tablet (10 mg total) by mouth at bedtime.  . memantine (NAMENDA XR) 28 MG CP24 24 hr capsule TAKE ONE CAPSULE BY MOUTH EVERY DAY  . Omega-3 Fatty Acids (FISH OIL) 1000 MG CAPS Take 1 tablet by mouth 3 (three) times daily.   Marland Kitchen tobramycin-dexamethasone (TOBRADEX) ophthalmic solution Place 1 drop into both eyes as needed.     No Known Allergies  Social History   Socioeconomic History  . Marital status: Married    Spouse name: Christopher Atkinson   . Number of children: 2  . Years of education: 87  . Highest education level: Not on file  Occupational History  . Not on file  Social Needs  . Financial resource strain: Not on file  . Food insecurity:      Worry: Not on file    Inability: Not on file  . Transportation needs:    Medical: Not on file    Non-medical: Not on file  Tobacco Use  . Smoking status: Former Research scientist (life sciences)  . Smokeless tobacco: Never Used  . Tobacco comment: quit smoking when he was 77 years old  Substance and Sexual Activity  . Alcohol use: No    Alcohol/week: 0.0 oz  . Drug use: No  . Sexual activity: Not on file  Lifestyle  . Physical activity:    Days per week: Not on file    Minutes per session: Not on file  . Stress: Not on file  Relationships  . Social connections:    Talks on phone: Not on file    Gets together: Not on file    Attends religious service: Not on file    Active member of club or organization: Not on file    Attends meetings of clubs or organizations: Not on file    Relationship status: Not on file  . Intimate partner violence:    Fear of current or ex partner: Not on file    Emotionally abused: Not on file    Physically abused: Not on file    Forced sexual activity: Not on file  Other Topics Concern  . Not on file  Social History Narrative   Patient lives at home with wife Christopher Atkinson    Patient as 2  children.    Patient is right handed.    Patient has a high school education.      Review of Systems: General: negative for chills, fever, night sweats or weight changes.  Cardiovascular: negative for chest pain, dyspnea on exertion, edema, orthopnea, palpitations, paroxysmal nocturnal dyspnea or shortness of breath Dermatological: negative for rash Respiratory: negative for cough or wheezing Urologic: negative for hematuria Abdominal: negative for nausea, vomiting, diarrhea, bright red blood per rectum, melena, or hematemesis Neurologic: negative for visual changes, syncope, or dizziness All other systems reviewed and are otherwise negative except as noted above.    Blood pressure 110/72, pulse (!) 52, height 5' 10.5" (1.791 m), weight 165 lb (74.8 kg).  General appearance: alert  and no distress Neck: no adenopathy, no carotid bruit, no JVD, supple, symmetrical, trachea midline and thyroid not enlarged, symmetric, no tenderness/mass/nodules Lungs: clear to auscultation bilaterally Heart: 1/6 diastolic murmur at the left upper sternal border consistent with aortic insufficiency. Extremities: extremities normal, atraumatic, no cyanosis or edema Pulses: 2+ and symmetric Skin: Skin color, texture, turgor normal. No rashes or lesions Neurologic: Alert and oriented X 3, normal strength and tone. Normal symmetric reflexes. Normal coordination and gait  EKG sinus bradycardia 52 with PACs and early repolarization changes. There is early R-wave transition as well a small nondiagnostic inferior Q waves. I personally reviewed this EKG.  ASSESSMENT AND PLAN:   Thoracic aortic aneurysm History of thoracic aortic aneurysm last measured by CTA 01/30/17 measuring 51 mm. I'm going to recheck a thoracic CTA.  Aortic insufficiency History of mild AI by 2-D echo in the past. We will recheck a 2-D echocardiogram      Lorretta Harp MD [facility] 03/13/2018 10:48 AM

## 2018-03-13 NOTE — Assessment & Plan Note (Signed)
History of mild AI by 2-D echo in the past. We will recheck a 2-D echocardiogram

## 2018-03-13 NOTE — Assessment & Plan Note (Signed)
History of thoracic aortic aneurysm last measured by CTA 01/30/17 measuring 51 mm. I'm going to recheck a thoracic CTA.

## 2018-03-13 NOTE — Patient Instructions (Signed)
Medication Instructions: Your physician recommends that you continue on your current medications as directed. Please refer to the Current Medication list given to you today.  Labwork: Your physician recommends that you return for lab work prior to CTA.   Testing/Procedures: Non-Cardiac CT Angiography (CTA) of Chest/Aorta, is a special type of CT scan that uses a computer to produce multi-dimensional views of major blood vessels throughout the body. In CT angiography, a contrast material is injected through an IV to help visualize the blood vessels  Your physician has requested that you have an echocardiogram. Echocardiography is a painless test that uses sound waves to create images of your heart. It provides your doctor with information about the size and shape of your heart and how well your heart's chambers and valves are working. This procedure takes approximately one hour. There are no restrictions for this procedure.  Follow-Up: Your physician wants you to follow-up in: 1 year with Dr. Gwenlyn Found. You will receive a reminder letter in the mail two months in advance. If you don't receive a letter, please call our office to schedule the follow-up appointment.  If you need a refill on your cardiac medications before your next appointment, please call your pharmacy.

## 2018-03-18 ENCOUNTER — Telehealth: Payer: Self-pay | Admitting: Neurology

## 2018-03-18 DIAGNOSIS — I351 Nonrheumatic aortic (valve) insufficiency: Secondary | ICD-10-CM | POA: Diagnosis not present

## 2018-03-18 DIAGNOSIS — I712 Thoracic aortic aneurysm, without rupture: Secondary | ICD-10-CM | POA: Diagnosis not present

## 2018-03-18 NOTE — Telephone Encounter (Signed)
Pts wife called stating that the cardiologist feels the pt should be evaluated for Parkinson's. [name] requesting a call to discuss a sooner appt

## 2018-03-18 NOTE — Telephone Encounter (Signed)
We already discussed in detail at last appointment. 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 We trialed sinemet which is a parkinson;s disease medication to see if it would help. They can keep their appointment with megan in June or if they want to be seen sooner please reschedule with caroline sooner. But we did address this at last appointment. Happy to have them see caroline sooner if they like to discuss again, the concept here is very difficult of parkinsonism vs parkinson'd disease. thanks

## 2018-03-19 LAB — BASIC METABOLIC PANEL
BUN/Creatinine Ratio: 23 (ref 10–24)
BUN: 16 mg/dL (ref 8–27)
CO2: 24 mmol/L (ref 20–29)
Calcium: 9.4 mg/dL (ref 8.6–10.2)
Chloride: 105 mmol/L (ref 96–106)
Creatinine, Ser: 0.71 mg/dL — ABNORMAL LOW (ref 0.76–1.27)
GFR calc Af Amer: 105 mL/min/{1.73_m2} (ref >59)
GFR calc non Af Amer: 90 mL/min/{1.73_m2} (ref >59)
GLUCOSE: 91 mg/dL (ref 65–99)
POTASSIUM: 4.2 mmol/L (ref 3.5–5.2)
Sodium: 144 mmol/L (ref 134–144)

## 2018-03-19 NOTE — Telephone Encounter (Signed)
Returned [employer]. Discussed that per Dr. Jaynee Eagles, this was discussed in detail at [facility] last appt. 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 Christopher Atkinson was given Sinemet to see if it would help. Patient's wife stated that the Sinemet did not seem to help. She has been getting frustrated trying to get him help. She did not feel that it was discussed in detail. RN offered to r/s the patient for a sooner appt than June to address their questions. Christopher Atkinson stated that she would talk to [facility] about what he wants to do and then she would call us back. She had no further questions.

## 2018-03-23 ENCOUNTER — Other Ambulatory Visit (HOSPITAL_COMMUNITY): Payer: Self-pay

## 2018-03-24 ENCOUNTER — Ambulatory Visit (INDEPENDENT_AMBULATORY_CARE_PROVIDER_SITE_OTHER)
Admission: RE | Admit: 2018-03-24 | Discharge: 2018-03-24 | Disposition: A | Discharge status: Home / Self Care | Payer: PPO | Source: Ambulatory Visit | Attending: Cardiovascular Disease | Admitting: Cardiovascular Disease

## 2018-03-24 ENCOUNTER — Ambulatory Visit (HOSPITAL_COMMUNITY): Payer: PPO | Attending: Cardiovascular Disease

## 2018-03-24 ENCOUNTER — Other Ambulatory Visit: Payer: Self-pay

## 2018-03-24 DIAGNOSIS — I712 Thoracic aortic aneurysm, without rupture, unspecified: Secondary | ICD-10-CM

## 2018-03-24 DIAGNOSIS — G4733 Obstructive sleep apnea (adult) (pediatric): Secondary | ICD-10-CM | POA: Diagnosis not present

## 2018-03-24 DIAGNOSIS — I517 Cardiomegaly: Secondary | ICD-10-CM | POA: Insufficient documentation

## 2018-03-24 DIAGNOSIS — Z8673 Personal history of transient ischemic attack (TIA), and cerebral infarction without residual deficits: Secondary | ICD-10-CM | POA: Insufficient documentation

## 2018-03-24 DIAGNOSIS — I351 Nonrheumatic aortic (valve) insufficiency: Secondary | ICD-10-CM | POA: Diagnosis not present

## 2018-03-24 MED ORDER — IOPAMIDOL (ISOVUE-370) INJECTION 76%
100.0000 mL | Freq: Once | INTRAVENOUS | Status: AC | PRN
  Administered 2018-03-24: 100 mL via INTRAVENOUS

## 2018-03-24 NOTE — Progress Notes (Addendum)
[place] [facility] [place], [place], [place] [address] [phone]  Date:  03/25/2018   Name:  Christopher Atkinson   DOB:  1941/04/07   MRN:  [id number]  PCP:  Darreld Mclean, MD    Chief Complaint: Urinary Frequency (c/o increased urinary frequency for past few days. )   History of Present Illness:  Christopher Atkinson is a 77 y.o. very pleasant male patient who presents with the following:  Here today with concern of UTI- they have noted frequent urination, and he will feel like he has to go right away after just urinating. His urge to urinate also seems to be triggered by hearing running water They have noted this for about 3 months They have not seen any blood in his urine.  However it does appear orange in color to his wife if he forgets to flushe  History of parkinsonism, CVA His wife provides the majority of the history today- Christopher Atkinson will answer a direct question if prompted  He exhibits mask like facies and a stiff shuffling gait which suggests parkinson's diease   From a previous note: He has noted some concerns about urination- he notes that he has to urinate frequently, and he may have accidents (urge incont)- he has noted this for several months if not longer. About 2 weeks ago he was having more severe urinary sx in addition to malaise and chills. [facility] and [facility]- however he did not have a urine culture at that time. The abx seemed to help but now his sx (urinary only- not systemic) have worsened again   From neurology note back in August- it sounds like he tried lovodopa for a while but they are not sure if it helped- his wife notes that it made him feel dizzy. We are not clear on if his dx is PD and/or dementia. However his wife feels that his memory seems good, and she does not think that he has dementia. She is worried that her husband is "going downhill" and is very concerned that he has PD.  They have a neurology appt coming up in June   Pulse Readings from Last 3 Encounters:  03/25/18 (!) 59  03/13/18 (!) 52  11/27/17 (!) 55    Patient Active Problem List   Diagnosis Date Noted  . Parkinsonism ([place]) 07/30/2017  . 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 Agitation 11/18/2014  . 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 Cognitive and behavioral changes 11/05/2014  . Altered mental status   . History of migraine headaches   . Acute CVA (cerebrovascular accident) ([place]) 10/27/2014  . Aphasia   . Thoracic aortic aneurysm ([place]) 11/04/2013  . Aortic insufficiency 11/04/2013  . Obstructive sleep apnea 11/04/2013    Past Medical History:  Diagnosis Date  . Aortic insufficiency   . Headache   . Obstructive sleep apnea    on CPAP  . Thoracic aortic aneurysm [facility])     Past Surgical History:  Procedure Laterality Date  . CHOLECYSTECTOMY    . GALLBLADDER SURGERY    . HERNIA REPAIR    . HERNIA REPAIR    . HERNIA REPAIR      Social History   Tobacco Use  . Smoking status: Former Research scientist (life sciences)  . Smokeless tobacco: Never Used  . Tobacco comment: quit smoking when he was 77 years old  Substance Use Topics  . Alcohol use: No  Alcohol/week: 0.0 oz  . Drug use: No    Family History  Problem Relation Age of Onset  . Heart Problems Father        CHF  . Dementia Mother   . Stroke Brother   . Dementia Brother   . Parkinson's disease Brother   . Alzheimer's disease Brother   . Cancer Brother        Throat cancer    No Known Allergies  Medication list has been reviewed and updated.  Current Outpatient Medications on File Prior to Visit  Medication Sig Dispense Refill  . butalbital-acetaminophen-caffeine (FIORICET, ESGIC) 50-325-40 MG tablet TAKE 1 TABLET BY MOUTH EVERY 6 HOURS AS NEEDED FOR HEADACHE( DO NOT TAKE MORE THAN 10 TABLETS PER MONTH) 10 tablet 5  . divalproex (DEPAKOTE ER) 500 MG 24 hr tablet Take 1 tablet (500 mg total) by mouth daily. 90 tablet 2   . donepezil (ARICEPT) 10 MG tablet Take 1 tablet (10 mg total) by mouth at bedtime. 90 tablet 2  . memantine (NAMENDA XR) 28 MG CP24 24 hr capsule TAKE ONE CAPSULE BY MOUTH EVERY DAY 30 capsule 5  . Omega-3 Fatty Acids (FISH OIL) 1000 MG CAPS Take 1 tablet by mouth 3 (three) times daily.     Marland Kitchen tobramycin-dexamethasone (TOBRADEX) ophthalmic solution Place 1 drop into both eyes as needed.  0   No current facility-administered medications on file prior to visit.     Review of Systems:  As per HPI- otherwise negative.   Physical Examination: Vitals:   03/25/18 1035  BP: 110/82  Pulse: (!) 59  Temp: 97.7 F (36.5 C)  SpO2: 95%   Vitals:   03/25/18 1035  Weight: 166 lb (75.3 kg)  Height: 5' 10.5" (1.791 m)   Body mass index is 23.48 kg/m. Ideal Body Weight: Weight in (lb) to have BMI = 25: 176.4  GEN: WDWN, NAD, Non-toxic, A & O x 3, looks well but has mask like face that does not display emotion. Show and shuffling gait HEENT: Atraumatic, Normocephalic. Neck supple. No masses, No LAD. Bilateral TM wnl, oropharynx normal.  PEERL,EOMI.   Ears and Nose: No external deformity. CV: RRR, No M/G/R. No JVD. No thrill. No extra heart sounds. PULM: CTA B, no wheezes, crackles, rhonchi. No retractions. No resp. distress. No accessory muscle use. ABD: S, NT, ND. No rebound. No HSM. Benign belly and no CVA tenderness  EXTR: No c/c/e PSYCH: Normally interactive. Conversant. Not depressed or anxious appearing.  Calm demeanor.   Results for orders placed or performed in visit on 03/25/18  POCT Urinalysis Dipstick (Automated)  Result Value Ref Range   Color, UA yellow    Clarity, UA clear    Glucose, UA negative    Bilirubin, UA negative    Ketones, UA negative    Spec Grav, UA 1.015 1.010 - 1.025   Blood, UA negative    pH, UA 6.0 5.0 - 8.0   Protein, UA negative    Urobilinogen, UA 1.0 0.2 or 1.0 E.U./dL   Nitrite, UA negative    Leukocytes, UA Negative Negative    Assessment  and Plan: Urinary frequency - Plan: POCT Urinalysis Dipstick (Automated), Urine Culture  Parkinsonism, unspecified Parkinsonism type (HCC)  Urinary frequency- UA is benign, but will check culture as well If negative for infection plan to refer to urology for his sx  [facility] renal function studies normal They are not clear if he has parkinson's disease or just some parkinsonism features.  Will touch base with Dr. Jaynee Eagles for them and be in touch   Signed Lamar Blinks, MD  Message to Dr. Jaynee Eagles on their behalf:  Ronnald Ramp- I hope that you are well!  I just saw Christopher Atkinson in the office; his wife is concerned that he is "going downhill" and is not clear on if he has parkinson's disease or not.  He does seem to display a typical shuffling gait and masked face.  I also was not certain if he carries a dx of PD, or if he was supposed to continue levodopa.  Can you let me know and I am glad to communicate with them  Thank you! JC  Received urine culture 4/5, called and LMOM with wife [name]. Urine culture negative, will refer to urology  Results for orders placed or performed in visit on 03/25/18  Urine Culture  Result Value Ref Range   MICRO NUMBER: [id number]    SPECIMEN QUALITY: ADEQUATE    Sample Source NOT GIVEN    STATUS: FINAL    ISOLATE 1:      Multiple organisms present, each less than 10,000 CFU/mL. These organisms, commonly found on external and internal genitalia, are considered to be colonizers. No further testing performed.  POCT Urinalysis Dipstick (Automated)  Result Value Ref Range   Color, UA yellow    Clarity, UA clear    Glucose, UA negative    Bilirubin, UA negative    Ketones, UA negative    Spec Grav, UA 1.015 1.010 - 1.025   Blood, UA negative    pH, UA 6.0 5.0 - 8.0   Protein, UA negative    Urobilinogen, UA 1.0 0.2 or 1.0 E.U./dL   Nitrite, UA negative    Leukocytes, UA Negative Negative   Negative for UTI

## 2018-03-25 ENCOUNTER — Encounter: Payer: Self-pay | Admitting: Family Medicine

## 2018-03-25 ENCOUNTER — Ambulatory Visit (INDEPENDENT_AMBULATORY_CARE_PROVIDER_SITE_OTHER): Payer: PPO | Admitting: Family Medicine

## 2018-03-25 VITALS — BP 110/82 | HR 59 | Temp 97.7°F | Ht 70.5 in | Wt 166.0 lb

## 2018-03-25 DIAGNOSIS — R350 Frequency of micturition: Secondary | ICD-10-CM | POA: Diagnosis not present

## 2018-03-25 DIAGNOSIS — G20 Parkinson's disease: Secondary | ICD-10-CM

## 2018-03-25 LAB — POC URINALSYSI DIPSTICK (AUTOMATED)
Bilirubin, UA: NEGATIVE
Blood, UA: NEGATIVE
GLUCOSE UA: NEGATIVE
Ketones, UA: NEGATIVE
Leukocytes, UA: NEGATIVE
Nitrite, UA: NEGATIVE
PROTEIN UA: NEGATIVE
Spec Grav, UA: 1.015 (ref 1.010–1.025)
UROBILINOGEN UA: 1 U/dL
pH, UA: 6 (ref 5.0–8.0)

## 2018-03-25 NOTE — Patient Instructions (Signed)
I am going to get a urine culture for you- will be in touch with this asap. If you have an infection we will treat it; if negative will have you see urology for a consultation I will touch base with your neurologist and see what I can find out about follow-up for you  [facility] your immunization records so I can make sure you are all caught up

## 2018-03-26 DIAGNOSIS — L570 Actinic keratosis: Secondary | ICD-10-CM | POA: Diagnosis not present

## 2018-03-26 DIAGNOSIS — C44329 Squamous cell carcinoma of skin of other parts of face: Secondary | ICD-10-CM | POA: Diagnosis not present

## 2018-03-26 DIAGNOSIS — L821 Other seborrheic keratosis: Secondary | ICD-10-CM | POA: Diagnosis not present

## 2018-03-26 DIAGNOSIS — D485 Neoplasm of uncertain behavior of skin: Secondary | ICD-10-CM | POA: Diagnosis not present

## 2018-03-26 LAB — URINE CULTURE
MICRO NUMBER: 90411860
SPECIMEN QUALITY:: ADEQUATE

## 2018-03-27 NOTE — Addendum Note (Signed)
Addended by: Lamar Blinks C on: 03/27/2018 05:01 PM   Modules accepted: Orders

## 2018-03-31 ENCOUNTER — Other Ambulatory Visit: Payer: Self-pay | Admitting: Neurology

## 2018-03-31 ENCOUNTER — Encounter: Payer: Self-pay | Admitting: Family Medicine

## 2018-04-01 ENCOUNTER — Other Ambulatory Visit: Payer: Self-pay | Admitting: Family Medicine

## 2018-04-01 DIAGNOSIS — G20 Parkinson's disease: Secondary | ICD-10-CM

## 2018-04-02 ENCOUNTER — Encounter: Payer: Self-pay | Admitting: Neurology

## 2018-04-06 NOTE — Progress Notes (Unsigned)
Christopher Atkinson was seen today in the movement disorders clinic for neurologic consultation at the request of Copland, Gay Filler, MD.  The consultation is for the 2nd opinion evaluation of PD.  The records that were made available to me were reviewed.  Pt has been seeing Dr. Jaynee Eagles and Ward Givens.  Patient has been seeing Dr. Jaynee Eagles since 2015.  On her first visit in epic with [facility], he was being seen for complicated headache, that would manifest at times as expressive aphasia and strokelike syndromes.  He had memory change at that point in time.  In fact, Depakote was being administered for agitation and inappropriate behavior.  His Moca was 19 in 2015.  By August, 2016 he was on Namenda.  He was referred back to [facility] neurology in March, 2017 for tremor.  Records indicate postural tremor was noted.  Depakote dose was decreased because of tremor.  Propranolol was attempted but discontinued because of bradycardia.  Notes from [facility] neurology from August, 2018 indicate that the patient was shuffling and freezing more.  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  Levodopa was attempted in August, 2018.  The first symptom(s) the patient noticed was {parkinsons general sx:18033} and this was {NUMBERS;0-15 BY 1:408015} {days/wks/mos/yrs:310907}.    Specific Symptoms:  Tremor: {yes no:314532} Family hx of similar:  {yes no:314532} Voice: *** Sleep: ***  Vivid Dreams:  {yes no:314532}  Acting out dreams:  {yes no:314532} Wet Pillows: {yes no:314532} Postural symptoms:  {yes no:314532}  Falls?  {yes no:314532} Bradykinesia symptoms: {parkinson brady:18041} Loss of smell:  {yes no:314532} Loss of taste:  {yes no:314532} Urinary Incontinence:  {yes no:314532} Difficulty Swallowing:  {yes no:314532} patient had a modified barium swallow in August, 2018.  This demonstrated moderate oropharyngeal dysphagia and it was felt he was a severe aspiration risk. Handwriting,  micrographia: {yes no:314532} Trouble with ADL's:  {yes no:314532}  Trouble buttoning clothing: {yes no:314532} Depression:  {yes no:314532} Memory changes:  {yes no:314532}   loss of empathy? ***  Apathy? ***  socially inappropriate behavior? ***  compulsive behaviors? ***  Change in eating habits? *** Hallucinations:  {yes no:314532}  visual distortions: {yes no:314532} N/V:  {yes no:314532} Lightheaded:  {yes no:314532}  Syncope: {yes no:314532} Diplopia:  {yes no:314532} Dyskinesia:  {yes no:314532}  Neuroimaging of the brain has *** previously been performed.  It *** available for my review today.  PREVIOUS MEDICATIONS: {Parkinson's RX:18200}  ALLERGIES:  No Known Allergies  CURRENT MEDICATIONS:  Outpatient Encounter Medications as of 04/07/2018  Medication Sig  . butalbital-acetaminophen-caffeine (FIORICET, ESGIC) 50-325-40 MG tablet TAKE 1 TABLET BY MOUTH EVERY 6 HOURS AS NEEDED FOR HEADACHE( DO NOT TAKE MORE THAN 10 TABLETS PER MONTH)  . divalproex (DEPAKOTE ER) 500 MG 24 hr tablet Take 1 tablet (500 mg total) by mouth daily.  Marland Kitchen donepezil (ARICEPT) 10 MG tablet Take 1 tablet (10 mg total) by mouth at bedtime.  . memantine (NAMENDA XR) 28 MG CP24 24 hr capsule TAKE ONE CAPSULE BY MOUTH EVERY DAY  . Omega-3 Fatty Acids (FISH OIL) 1000 MG CAPS Take 1 tablet by mouth 3 (three) times daily.   Marland Kitchen tobramycin-dexamethasone (TOBRADEX) ophthalmic solution Place 1 drop into both eyes as needed.   No facility-administered encounter medications on file as of 04/07/2018.     PAST MEDICAL HISTORY:   Past Medical History:  Diagnosis Date  . Aortic insufficiency   . Headache   . Obstructive sleep apnea    on  CPAP  . Thoracic aortic aneurysm ([place])     PAST SURGICAL HISTORY:   Past Surgical History:  Procedure Laterality Date  . CHOLECYSTECTOMY    . GALLBLADDER SURGERY    . HERNIA REPAIR    . HERNIA REPAIR    . HERNIA REPAIR      SOCIAL HISTORY:   Social History    Socioeconomic History  . Marital status: Married    Spouse name: [name]   . Number of children: 2  . Years of education: 32  . Highest education level: Not on file  Occupational History  . Not on file  Social Needs  . Financial resource strain: Not on file  . Food insecurity:    Worry: Not on file    Inability: Not on file  . Transportation needs:    Medical: Not on file    Non-medical: Not on file  Tobacco Use  . Smoking status: Former Research scientist (life sciences)  . Smokeless tobacco: Never Used  . Tobacco comment: quit smoking when he was 77 years old  Substance and Sexual Activity  . Alcohol use: No    Alcohol/week: 0.0 oz  . Drug use: No  . Sexual activity: Not on file  Lifestyle  . Physical activity:    Days per week: Not on file    Minutes per session: Not on file  . Stress: Not on file  Relationships  . Social connections:    Talks on phone: Not on file    Gets together: Not on file    Attends religious service: Not on file    Active member of club or organization: Not on file    Attends meetings of clubs or organizations: Not on file    Relationship status: Not on file  . Intimate partner violence:    Fear of current or ex partner: Not on file    Emotionally abused: Not on file    Physically abused: Not on file    Forced sexual activity: Not on file  Other Topics Concern  . Not on file  Social History Narrative   Patient lives at home with wife [name]    Patient as 2 children.    Patient is right handed.    Patient has a high school education.     FAMILY HISTORY:   Family Status  Relation Name Status  . Father  Deceased  . Mother  Deceased  . Brother  (Not Specified)  . Brother  (Not Specified)  . Brother  (Not Specified)  . Brother  (Not Specified)    ROS:  A complete 10 system review of systems was obtained and was unremarkable apart from what is mentioned above.  PHYSICAL EXAMINATION:    VITALS:  There were no vitals filed for this visit.  GEN:   The patient appears stated age and is in NAD. HEENT:  Normocephalic, atraumatic.  The mucous membranes are moist. The superficial temporal arteries are without ropiness or tenderness. CV:  RRR Lungs:  CTAB Neck/HEME:  There are no carotid bruits bilaterally.  Neurological examination:  Orientation:  Montreal Cognitive Assessment  02/21/2016 11/16/2014 11/02/2014  Visuospatial/ Executive (0/5) 3 4 4   Naming (0/3) 2 3 3   Attention: Read list of digits (0/2) 2 2 2   Attention: Read list of letters (0/1) 1 1 1   Attention: Serial 7 subtraction starting at 100 (0/3) 3 3 2   Language: Repeat phrase (0/2) 2 2 2   Language : Fluency (0/1) 0 0 0  Abstraction (0/2) 1  2 1  Delayed Recall (0/5) 0 0 0  Orientation (0/6) 6 4 4   Total 20 21 19   Adjusted Score (based on education) 21 22 20    Cranial nerves: There is good facial symmetry. Pupils are equal round and reactive to light bilaterally. Fundoscopic exam reveals clear margins bilaterally. Extraocular muscles are intact. The visual fields are full to confrontational testing. The speech is fluent and clear. Soft palate rises symmetrically and there is no tongue deviation. Hearing is intact to conversational tone. Sensation: Sensation is intact to light and pinprick throughout (facial, trunk, extremities). Vibration is intact at the bilateral big toe. There is no extinction with double simultaneous stimulation. There is no sensory dermatomal level identified. Motor: Strength is 5/5 in the bilateral upper and lower extremities.   Shoulder shrug is equal and symmetric.  There is no pronator drift. Deep tendon reflexes: Deep tendon reflexes are 2/4 at the bilateral biceps, triceps, brachioradialis, patella and achilles. Plantar responses are downgoing bilaterally.  Movement examination: Tone: There is ***tone in the bilateral upper extremities.  The tone in the lower extremities is ***.  Abnormal movements: *** Coordination:  There is *** decremation with  RAM's, *** Gait and Station: The patient has *** difficulty arising out of a deep-seated chair without the use of the hands. The patient's stride length is ***.  The patient has a *** pull test.      ASSESSMENT/PLAN:  ***  -explained to patient and wife difference between idiopathic Parkinson's disease and Lewy body dementia.  Explained that he developed memory loss years prior to developing parkinsonism symptoms, which would exclude the diagnosis of idiopathic Parkinson's disease.  -explained criteria for behavioral variant FTD.  For years per records, the patient has had changes of loss of empathy, apathy, ***socially inappropriate behavior, compulsive behaviors  Explained that parkinsonism can arise in this disease as it advances.  ***This did not include the 40 min of record review which was detailed above, which was non face to face time.   Cc:  Copland, Gay Filler, MD

## 2018-04-07 ENCOUNTER — Ambulatory Visit: Payer: PPO | Admitting: Neurology
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
## 2018-04-08 ENCOUNTER — Encounter: Payer: Self-pay | Admitting: Neurology

## 2018-04-08 ENCOUNTER — Ambulatory Visit: Payer: PPO | Admitting: Neurology

## 2018-04-08 VITALS — BP 120/70 | HR 64 | Ht 71.0 in | Wt 166.0 lb

## 2018-04-08 DIAGNOSIS — G20 Parkinson's disease: Secondary | ICD-10-CM | POA: Diagnosis not present

## 2018-04-08 DIAGNOSIS — K117 Disturbances of salivary secretion: Secondary | ICD-10-CM | POA: Diagnosis not present

## 2018-04-08 DIAGNOSIS — J300 Vasomotor rhinitis: Secondary | ICD-10-CM | POA: Diagnosis not present

## 2018-04-08 MED ORDER — CARBIDOPA-LEVODOPA ER 25-100 MG PO TBCR: 1.0000 | EXTENDED_RELEASE_TABLET | Freq: Three times a day (TID) | ORAL | 1 refills | Status: DC

## 2018-04-08 NOTE — Patient Instructions (Addendum)
1. You have been referred to Neuro Rehab for physical, occupational and speech therapy. They will call you directly to schedule an appointment.  Please call [phone] if you do not hear from them.   2. Start Carbidopa Levodopa 25/100 CR as follows:  Take one tablet at 8 am for one week Then take one tablet at 8 am and one tablet at 12 pm for one week Then take one tablet at 8 am, one tablet at 12 pm, and one tablet at 4 pm  Prescription sent to the pharmacy.

## 2018-04-17 DIAGNOSIS — N3941 Urge incontinence: Secondary | ICD-10-CM | POA: Diagnosis not present

## 2018-04-21 ENCOUNTER — Ambulatory Visit: Payer: Self-pay | Admitting: Neurology

## 2018-04-23 DIAGNOSIS — C44329 Squamous cell carcinoma of skin of other parts of face: Secondary | ICD-10-CM | POA: Diagnosis not present

## 2018-05-01 ENCOUNTER — Ambulatory Visit: Payer: PPO | Attending: Neurology | Admitting: Physical Therapy

## 2018-05-01 ENCOUNTER — Encounter: Payer: Self-pay | Admitting: Physical Therapy

## 2018-05-01 DIAGNOSIS — R2689 Other abnormalities of gait and mobility: Secondary | ICD-10-CM | POA: Insufficient documentation

## 2018-05-01 DIAGNOSIS — M6281 Muscle weakness (generalized): Secondary | ICD-10-CM | POA: Insufficient documentation

## 2018-05-01 DIAGNOSIS — R41844 Frontal lobe and executive function deficit: Secondary | ICD-10-CM | POA: Diagnosis not present

## 2018-05-01 DIAGNOSIS — R293 Abnormal posture: Secondary | ICD-10-CM | POA: Diagnosis not present

## 2018-05-01 DIAGNOSIS — R1312 Dysphagia, oropharyngeal phase: Secondary | ICD-10-CM | POA: Diagnosis not present

## 2018-05-01 DIAGNOSIS — R2681 Unsteadiness on feet: Secondary | ICD-10-CM | POA: Diagnosis not present

## 2018-05-01 DIAGNOSIS — R471 Dysarthria and anarthria: Secondary | ICD-10-CM | POA: Diagnosis not present

## 2018-05-01 DIAGNOSIS — R29898 Other symptoms and signs involving the musculoskeletal system: Secondary | ICD-10-CM | POA: Diagnosis not present

## 2018-05-01 DIAGNOSIS — R29818 Other symptoms and signs involving the nervous system: Secondary | ICD-10-CM | POA: Diagnosis not present

## 2018-05-01 DIAGNOSIS — R278 Other lack of coordination: Secondary | ICD-10-CM | POA: Diagnosis not present

## 2018-05-01 NOTE — Therapy (Signed)
[facility] [address] [place], [place], [address] Phone: [phone]   Fax:  [phone]  Physical Therapy Evaluation  Patient Details  Name: Christopher Atkinson MRN: [id number] Date of Birth: Jul 13, 1941 Referring Provider: Alonza Bogus   Encounter Date: 05/01/2018  PT End of Session - 05/01/18 2138    Visit Number  1    Number of Visits  17    Date for PT Re-Evaluation  07/30/18    Authorization Type  [employer]    PT Start Time  0804    PT Stop Time  0847    PT Time Calculation (min)  43 min    Equipment Utilized During Treatment  Gait belt    Activity Tolerance  Patient tolerated treatment well    Behavior During Therapy  [facility] for tasks assessed/performed       Past Medical History:  Diagnosis Date  . Aortic insufficiency   . Headache   . Obstructive sleep apnea    on CPAP  . Thoracic aortic aneurysm [facility])     Past Surgical History:  Procedure Laterality Date  . BACK SURGERY    . CATARACT EXTRACTION, BILATERAL    . CHOLECYSTECTOMY    . HERNIA REPAIR    . HERNIA REPAIR    . HERNIA REPAIR    . NOSE SURGERY      There were no vitals filed for this visit.   Subjective Assessment - 05/01/18 0809    Subjective  Wife assists with history:  Dr. Carles Collet thinks I have Parkinson's.  Is having tremors and difficulty walking.  Has had several falls in past 6 months; last fall was in January.  Now have handrails at doorway into home.  All the falls have been up or down the steps, or with walking the dogs.    Patient is accompained by:  Family member wife    Patient Stated Goals  Pt's goals for therapy are to be able to walk better and stand straighter.    Currently in Pain?  No/denies At times-c/o back pain,not at eval         [facility] PT Assessment - 05/01/18 0814      Assessment   Medical Diagnosis  Parkinson's disease    Referring Provider  Tat, Wells Guiles    Onset Date/Surgical Date  04/08/18 MD visit      Precautions   Precautions  Fall    Precaution Comments  Is not currently driving (wife does not want him to)      Balance Screen   Has the patient fallen in the past 6 months  Yes    How many times?  2    Has the patient had a decrease in activity level because of a fear of falling?   No    Is the patient reluctant to leave their home because of a fear of falling?   No      Home Environment   Living Environment  Private residence    Living Arrangements  Spouse/significant other    Available Help at Discharge  Family    Type of Dent to enter    Entrance Stairs-Number of Steps  4 home and beach house    Entrance Stairs-Rails  Left going up steps    [place]  One level    [place] - 4 wheels for longer distances      Prior Function  Level of Independence  Independent with basic ADLs slowed    Leisure  Wife reports he has curtailed a lot of activity.  He does walk with rollator to wait for granddaughter at school bus.        Observation/Other Assessments   Focus on Therapeutic Outcomes (FOTO)   NA      Posture/Postural Control   Posture/Postural Control  Postural limitations    Postural Limitations  Forward head;Rounded Shoulders;Posterior pelvic tilt;Flexed trunk      ROM / Strength   AROM / PROM / Strength  Strength      Strength   Overall Strength  Deficits    Strength Assessment Site  Hip;Knee;Ankle    Right/Left Hip  Right;Left    Right Hip Flexion  3+/5    Left Hip Flexion  4/5    Right/Left Knee  Right;Left    Right Knee Flexion  4/5    Right Knee Extension  4-/5    Left Knee Flexion  4/5    Left Knee Extension  4/5    Right/Left Ankle  Right;Left    Right Ankle Dorsiflexion  4/5    Left Ankle Dorsiflexion  4/5      Transfers   Transfers  Sit to Stand;Stand to Sit    Sit to Stand  5: Supervision;Without upper extremity assist;From chair/3-in-1 Increased time; prefers use of UEs    Five time sit to stand comments    22.06    Stand to Sit  5: Supervision;Without upper extremity assist;To chair/3-in-1    Stand to Sit Details  When turning to sit, tends to not fully turn around prior to sitting      Ambulation/Gait   Ambulation/Gait  Yes    Ambulation/Gait Assistance  5: Supervision;4: Min guard    Ambulation Distance (Feet)  200 Feet    Assistive device  None;Rollator    Gait Pattern  Step-through pattern;Decreased arm swing - right;Decreased arm swing - left;Decreased step length - right;Decreased dorsiflexion - right;Shuffle;Festinating;Decreased trunk rotation;Trunk flexed;Narrow base of support;Poor foot clearance - right    Ambulation Surface  Level;Indoor    Gait velocity  16.37 (2 ft/sec) with no device 15.16 (2.16 ft/sec) rollator; with cues+rollator (2.26 ft/se    Gait Comments  Festinating with turns, R foot sticks to ground      Standardized Balance Assessment   Standardized Balance Assessment  Timed Up and Go Test      Timed Up and Go Test   Normal TUG (seconds)  27.53    Cognitive TUG (seconds)  49.53    TUG Comments  Scores >13.5 seconds indicate fall risk; >30 seconds indicate difficulty with ADLs; >10% difference indicates difficulty with dual tasking      High Level Balance   High Level Balance Comments  Posterior push and release test:  at least 3 small steps in posterior direction      with posterior push and release:  Need's therapist's assistance to prevent fall.          Objective measurements completed on examination: See above findings.                PT Short Term Goals - 05/01/18 2147      PT SHORT TERM GOAL #1   Title  Pt will perform HEP with wife's supervision for improved functional mobility and balance.  TARGET 05/29/18    Time  4    Period  Weeks    Status  New  Target Date  05/29/18      PT SHORT TERM GOAL #2   Title  Pt will improve 5x sit<>stand to less than or equal to 17 seconds for improved efficiency and safety with gait.    Time   4    Period  Weeks    Status  New    Target Date  05/29/18      PT SHORT TERM GOAL #3   Title  Pt will improve TUG score to less than or equal to 20 seconds for decreased fall risk.    Time  4    Period  Weeks    Status  New    Target Date  05/29/18      PT SHORT TERM GOAL #4   Title  Pt/wife will verbalize understanding of tips to reduce freezing with gait and turns.    Time  4    Period  Weeks    Status  New    Target Date  05/29/18        PT Long Term Goals - 05/01/18 2150      PT LONG TERM GOAL #1   Title  Pt/wife will verbalize understanding of fall prevention in home environment.  TARGET    Time  8    Period  Weeks    Status  New    Target Date  06/26/18      PT LONG TERM GOAL #2   Title  Pt will improve 5x sit<>stand to less than or equal to 14 seconds for decreased fall risk.    Time  8    Period  Weeks    Status  New    Target Date  06/26/18      PT LONG TERM GOAL #3   Title  Pt will improve TUG score to less than or equal to 15 seconds ofr decreased fall risk.    Time  8    Period  Weeks    Status  New    Target Date  06/26/18      PT LONG TERM GOAL #4   Title  Pt will improve gait velocity to at least 2.62 ft/sec for improved gait efficiency and safety.    Time  8    Period  Weeks    Status  New    Target Date  06/26/18      PT LONG TERM GOAL #5   Title  Pt/wife will verbalize plans for ongoing community fitness upon d/c from PT.    Time  8    Period  Weeks    Status  New    Target Date  06/26/18             Plan - 05/01/18 2138    Clinical Impression Statement  Pt is a 77 year old male who presents to OP PT with history of Parkinson's-like symptoms over the past several years, with recent diagnosis likely of idiopathic Parkinson's disease.  He has had history of several falls in the past six months.  He presents to OP PT with decreased balance, decreased timing and coordination of gait with festination/freezing with turns, abnormal  posture, decreased functional lower extremity strength, decreased ability for dual tasking with gait.  Pt is at fall risk per TUG score, with noted slowed gait, bradykinesia.  Pt would benefit from skilled PT to address the above stated deficits to address functional mobility and decreased fall risk.    History and Personal Factors relevant to plan  of care:  hx of falls, >3 co-morbidities, >3 system involvement    Clinical Presentation  Evolving    Clinical Presentation due to:  af fall risk per TUG/TUG cog scores; Parkinson's as progresive neurological disease    Clinical Decision Making  Moderate    Rehab Potential  Good    PT Frequency  2x / week    PT Duration  8 weeks plus eval    PT Treatment/Interventions  ADLs/Self Care Home Management;DME Instruction;Gait training;Stair training;Functional mobility training;Therapeutic activities;Therapeutic exercise;Balance training;Neuromuscular re-education;Patient/family education    PT Next Visit Plan  Sit<>stand and turning to sit; tips to reduce freezing with gait; gait activities with rollator/postural exercises; try PWR! Moves    Consulted and Agree with Plan of Care  Patient;Family member/caregiver    Family Member Consulted  wife       Patient will benefit from skilled therapeutic intervention in order to improve the following deficits and impairments:  Abnormal gait, Decreased balance, Decreased mobility, Decreased safety awareness, Difficulty walking, Decreased strength, Postural dysfunction  Visit Diagnosis: Other abnormalities of gait and mobility  Unsteadiness on feet  Abnormal posture  Muscle weakness (generalized)  Other symptoms and signs involving the nervous system     Problem List Patient Active Problem List   Diagnosis Date Noted  . Parkinsonism ([place]) 07/30/2017  . 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 Agitation 11/18/2014  . Complicated migraine 29/92/4268  . Cognitive and behavioral changes  11/05/2014  . Altered mental status   . History of migraine headaches   . Acute CVA (cerebrovascular accident) ([place]) 10/27/2014  . Aphasia   . Thoracic aortic aneurysm ([place]) 11/04/2013  . Aortic insufficiency 11/04/2013  . Obstructive sleep apnea 11/04/2013    Missie Gehrig W. 05/01/2018, 9:54 PM Mady Haagensen, PT 05/01/18 9:55 PM Phone: [phone] Fax: [facility] [address] [place], [place], [address] Phone: [phone]   Fax:  [phone]  Name: Christopher Atkinson MRN: [id number] Date of Birth: [birth year]

## 2018-05-04 ENCOUNTER — Ambulatory Visit: Payer: PPO

## 2018-05-04 DIAGNOSIS — R1312 Dysphagia, oropharyngeal phase: Secondary | ICD-10-CM

## 2018-05-04 DIAGNOSIS — R2689 Other abnormalities of gait and mobility: Secondary | ICD-10-CM | POA: Diagnosis not present

## 2018-05-04 DIAGNOSIS — R471 Dysarthria and anarthria: Secondary | ICD-10-CM

## 2018-05-04 NOTE — Therapy (Signed)
[facility] [address] [place], [place], [address] Phone: [phone]   Fax:  [phone]  Speech Language Pathology Evaluation  Patient Details  Name: Christopher Atkinson MRN: [id number] Date of Birth: 1941-11-26 Referring Provider: Alonza Bogus, DO   Encounter Date: 05/04/2018  End of Session - 05/04/18 1642    Visit Number  1    Number of Visits  17    Date for SLP Re-Evaluation  07/10/18    SLP Start Time  65    SLP Stop Time   1101    SLP Time Calculation (min)  41 min    Activity Tolerance  Patient tolerated treatment well       Past Medical History:  Diagnosis Date  . Aortic insufficiency   . Headache   . Obstructive sleep apnea    on CPAP  . Thoracic aortic aneurysm [facility])     Past Surgical History:  Procedure Laterality Date  . BACK SURGERY    . CATARACT EXTRACTION, BILATERAL    . CHOLECYSTECTOMY    . HERNIA REPAIR    . HERNIA REPAIR    . HERNIA REPAIR    . NOSE SURGERY      There were no vitals filed for this visit.  Subjective Assessment - 05/04/18 1024    Subjective  Pt did not readily recall visit to Dr. Carles Collet last month. Wife reports holding head up was one cue Dr. Carles Collet gave to pt.     Patient is accompained by:  Family member wife    Currently in Pain?  No/denies         SLP Evaluation [facility] - 05/04/18 1024      SLP Visit Information   SLP Received On  05/04/18    Referring Provider  Tat, Wells Guiles, DO    Onset Date  4-5 years ago    Medical Diagnosis  Parkinson's Disease      General Information   HPI  Pt with questionable PD diagnosis -saw Dr. Carles Collet last month and suspects idopathic PD (vs. Lewy-Body dementia and frontotemporal dementia).       Balance Screen   Has the patient fallen in the past 6 months  Yes    How many times?  once    Has the patient had a decrease in activity level because of a fear of falling?   Yes      Prior Functional Status   Cognitive/Linguistic Baseline   Within functional limits    Type of Home  House     Lives With  Spouse    Vocation  Retired      Associate Professor   Overall Cognitive Status  Impaired/Different from baseline    Area of Impairment  Attention (auditory processing speed)      Auditory Comprehension   Overall Auditory Comprehension  Impaired    Interfering Components  Attention;Processing speed    EffectiveTechniques  Extra processing time;Repetition      Verbal Expression   Overall Verbal Expression  Impaired    Initiation  Impaired initiation- due to bradyphrenia    Interfering Components  Attention (processing speed)    Effective Techniques  -- extra time    Other Verbal Expression Comments  Pt with notable anomic episodes during conversation.       Oral Motor/Sensory Function   Overall Oral Motor/Sensory Function  Impaired    Labial ROM  Within Functional Limits with cues    Labial Strength  Reduced    Labial  Coordination  Reduced    Lingual ROM  Within Functional Limits with cues    Lingual Strength  Reduced Right    Lingual Coordination  Reduced    Velum  Within Functional Limits    Overall Oral Motor/Sensory Function  wife endorses coughing with meals average once -twice per day. No overt s/s aspiration PNA at this time. Pt had modified barium swallow August 2018 recommending dys III/thin with precautions.       Motor Speech   Respiration  Impaired    Phonation  Low vocal intensity average mid to upper 60s dB, based on complexity of topic    Articulation  Impaired    Level of Impairment  Sentence    Intelligibility  Intelligible in quiet environment    Effective Techniques  Increased vocal intensity      Pt's conversational speech averaged in mid to upper 60s dB for simpler and more familiar topics, but averaged lower (mid 60s dB) for more complex or more linguistically complex topics. Wife states she has difficulty understanding pt in their home. Pt was stimulable for increasing conversational volume to upper  60s-lower 70s dB in question and answer tasks following production of loud /a/, which was average in mid 80s dB.             ADULT SLP TREATMENT - 05/04/18 1026      General Information   Behavior/Cognition  Alert;Cooperative;Pleasant mood    HPI  Pt with questionable PD diagnosis; Dr. Carles Collet asked for second opinion and suspects idiopathic PD (vs. Lewy body dementia or         SLP Education - 05/04/18 1641    Education provided  Yes    Education Details  results of ST eval, possible goals, pt is speaking at lower than WNL volume    Person(s) Educated  Patient    Methods  Explanation    Comprehension  Verbalized understanding       SLP Short Term Goals - 05/04/18 1648      SLP SHORT TERM GOAL #1   Title  pt will follow swallow precautions in 80% of opportunities during session with appropriate POs    Time  4    Period  Weeks or 9 total visits, for all STGs    Status  New      SLP SHORT TERM GOAL #2   Title  pt will demo swallowing HEP with occaisonal min A over two sessions    Time  4    Period  Weeks    Status  New      SLP SHORT TERM GOAL #3   Title  pt will produce /a/ with average upper 80s over 4 sessions    Time  4    Period  Weeks    Status  New      SLP SHORT TERM GOAL #4   Title  pt will participate in 3 minutes simple conversation with average volume 69 dB over two sessions    Time  4    Period  Weeks    Status  New      SLP SHORT TERM GOAL #5   Title  pt will answer simple questions with average 70dB in two therapy sessions    Time  4    Period  Weeks    Status  New       SLP Long Term Goals - 05/04/18 1652      SLP LONG TERM GOAL #1  Title  pt will follow swallow precautions >/= 90% of the time with written cues    Time  8    Period  Weeks or 17 total sessions, for all LTGs    Status  New      SLP LONG TERM GOAL #2   Title  pt will produce loud /a/ average upper 80s over 6 sessions    Time  8    Period  Weeks    Status  New       SLP LONG TERM GOAL #3   Title  pt will participate in 5+ minutes simple conversation with average volume in low 70s dB with occasional nonverbal cues    Time  8    Period  Weeks    Status  New      SLP LONG TERM GOAL #4   Title  pt/family will tell 3 overt s/s aspiration PNA    Time  8    Period  Weeks    Status  New       Plan - 05/04/18 1642    Clinical Impression Statement  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 Pt was mildly stimulable for louder speech however bradyphrenia/multitasking between message and loudness hinders pt's ability for WNL volume as well. Pt also had modified (MBSS) August 2018 with recommended ST follow up (none was done to this SLP knowledge). Modified recommended dys III/thin wiht small sips/bites, effortful swalow, intermittent throat clear, multiple swallows for each bite/sip, and "hock" during meals. Question pt's ability for completion of strict and indepth dysphagia HEP at this time. SLP will provide and teach pt some exercises to complete at home but focus will be on caregiver/pt education re: dysphagia and complications. Pt would beneift from skilled ST focusing on increasing pt's conversational loudness and improving his safety with PO intake.     Speech Therapy Frequency  2x / week    Duration  -- 8 weeks, or 17 total visits    Treatment/Interventions  Aspiration precaution training;Pharyngeal strengthening exercises;Diet toleration management by SLP;Trials of upgraded texture/liquids;Internal/external aids;Multimodal communcation approach;Patient/family education;Compensatory strategies;SLP instruction and feedback;Cueing hierarchy;Environmental controls;Functional tasks    Potential to Achieve Goals  Good    Potential Considerations  Severity of impairments    Consulted and Agree with Plan of Care  Patient       Patient will benefit from skilled therapeutic intervention in order to improve the following  deficits and impairments:   Dysphagia, oropharyngeal phase  Dysarthria and anarthria    Problem List Patient Active Problem List   Diagnosis Date Noted  . Parkinsonism ([place]) 07/30/2017  . 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 Agitation 11/18/2014  . Complicated migraine 47/08/6282  . Cognitive and behavioral changes 11/05/2014  . Altered mental status   . History of migraine headaches   . Acute CVA (cerebrovascular accident) ([place]) 10/27/2014  . Aphasia   . Thoracic aortic aneurysm ([place]) 11/04/2013  . Aortic insufficiency 11/04/2013  . Obstructive sleep apnea 11/04/2013    [facility] ,[place], CCC-SLP  05/04/2018, 5:01 PM  [place] [address] [place], [place], [address] Phone: [phone]   Fax:  [phone]  Name: Christopher Atkinson MRN: [id number] Date of Birth: Nov 27, 1941

## 2018-05-04 NOTE — Patient Instructions (Signed)
Read 3-4 paragraphs out loud every day - focus on louder talking.

## 2018-05-06 ENCOUNTER — Ambulatory Visit: Payer: PPO | Admitting: Occupational Therapy

## 2018-05-06 ENCOUNTER — Ambulatory Visit: Payer: PPO

## 2018-05-06 DIAGNOSIS — R278 Other lack of coordination: Secondary | ICD-10-CM

## 2018-05-06 DIAGNOSIS — M6281 Muscle weakness (generalized): Secondary | ICD-10-CM

## 2018-05-06 DIAGNOSIS — R293 Abnormal posture: Secondary | ICD-10-CM

## 2018-05-06 DIAGNOSIS — R1312 Dysphagia, oropharyngeal phase: Secondary | ICD-10-CM

## 2018-05-06 DIAGNOSIS — R29818 Other symptoms and signs involving the nervous system: Secondary | ICD-10-CM

## 2018-05-06 DIAGNOSIS — R471 Dysarthria and anarthria: Secondary | ICD-10-CM

## 2018-05-06 DIAGNOSIS — R2689 Other abnormalities of gait and mobility: Secondary | ICD-10-CM

## 2018-05-06 DIAGNOSIS — R41844 Frontal lobe and executive function deficit: Secondary | ICD-10-CM

## 2018-05-06 DIAGNOSIS — R29898 Other symptoms and signs involving the musculoskeletal system: Secondary | ICD-10-CM

## 2018-05-06 DIAGNOSIS — R2681 Unsteadiness on feet: Secondary | ICD-10-CM

## 2018-05-06 NOTE — Therapy (Signed)
[name] [address] [place], [place], [address] Phone: [phone]   Fax:  [phone]  Speech Language Pathology Treatment  Patient Details  Name: Christopher Atkinson MRN: [id number] Date of Birth: 1941/04/27 Referring Provider: Alonza Bogus, DO   Encounter Date: 05/06/2018  End of Session - 05/06/18 1534    Visit Number  2    Number of Visits  17    Date for SLP Re-Evaluation  07/10/18    SLP Start Time  54    SLP Stop Time   1100    SLP Time Calculation (min)  41 min    Activity Tolerance  Patient tolerated treatment well       Past Medical History:  Diagnosis Date  . Aortic insufficiency   . Headache   . Obstructive sleep apnea    on CPAP  . Thoracic aortic aneurysm [facility])     Past Surgical History:  Procedure Laterality Date  . BACK SURGERY    . CATARACT EXTRACTION, BILATERAL    . CHOLECYSTECTOMY    . HERNIA REPAIR    . HERNIA REPAIR    . HERNIA REPAIR    . NOSE SURGERY      There were no vitals filed for this visit.         ADULT SLP TREATMENT - 05/06/18 1026      General Information   Behavior/Cognition  Alert;Cooperative;Lethargic      Treatment Provided   Treatment provided  Dysphagia      Dysphagia Treatment   Temperature Spikes Noted  No    Patient observed directly with PO's  No    Amount of cueing  Total    Other treatment/comments  SLP educated pt and wife for swallowing exercises and for swallow precautions. SLP reviewed STGs and LTGs and pt/wife agreed with them. Pt req'd total A faded to max A consistently with exercises. SLP told pt that wife would likely need to help pt with completion; wife was proficient at judging correct/incorrect production of exercises.      Cognitive-Linquistic Treatment   Treatment focused on  --      Assessment / Recommendations / Plan   Plan  Continue with current plan of care      Dysphagia Recommendations   Diet recommendations   Dysphagia 3 (mechanical soft);Regular;Thin liquid    Liquids provided via  Cup    Medication Administration  Whole meds with liquid    Supervision  Full supervision/cueing for compensatory strategies    Compensations  Effortful swallow;Clear throat intermittently;Multiple dry swallows after each bite/sip;Small sips/bites;Slow rate "hock"      Progression Toward Goals   Progression toward goals  Progressing toward goals       SLP Education - 05/06/18 1533    Education provided  Yes    Education Details  swallow precautions, HEP, goals    Person(s) Educated  Patient;Spouse    Methods  Explanation;Demonstration;Verbal cues;Handout    Comprehension  Verbalized understanding;Returned demonstration;Verbal cues required;Need further instruction       SLP Short Term Goals - 05/06/18 1641      SLP SHORT TERM GOAL #1   Title  pt will follow swallow precautions in 80% of opportunities during session with appropriate POs    Time  4    Period  Weeks or 9 total visits, for all STGs    Status  On-going      SLP SHORT TERM GOAL #2   Title  pt will  demo swallowing HEP with occaisonal min A over two sessions    Time  4    Period  Weeks    Status  On-going      SLP SHORT TERM GOAL #3   Title  pt will produce /a/ with average upper 80s over 4 sessions    Time  4    Period  Weeks    Status  On-going      SLP SHORT TERM GOAL #4   Title  pt will participate in 3 minutes simple conversation with average volume 69 dB over two sessions    Time  4    Period  Weeks    Status  On-going      SLP SHORT TERM GOAL #5   Title  pt will answer simple questions with average 70dB in two therapy sessions    Time  4    Period  Weeks    Status  On-going       SLP Long Term Goals - 05/06/18 1642      SLP LONG TERM GOAL #1   Title  pt will follow swallow precautions >/= 90% of the time with written cues    Time  8    Period  Weeks or 17 total sessions, for all LTGs    Status  On-going      SLP  LONG TERM GOAL #2   Title  pt will produce loud /a/ average upper 80s over 6 sessions    Time  8    Period  Weeks    Status  On-going      SLP LONG TERM GOAL #3   Title  pt will participate in 5+ minutes simple conversation with average volume in low 70s dB with occasional nonverbal cues    Time  8    Period  Weeks    Status  On-going      SLP LONG TERM GOAL #4   Title  pt/family will tell 3 overt s/s aspiration PNA    Time  8    Period  Weeks    Status  On-going       Plan - 05/06/18 1534    Clinical Impression Statement  Pt, in presence of wife, was provided HEP for swallowing and swallow precautions were reviewed today. See "skilled interventions" and "pt education" for details. Pt would cont to beneift from skilled ST focusing on increasing pt's conversational loudness and improving his safety with PO intake.     Speech Therapy Frequency  2x / week    Duration  -- 8 weeks, or 17 total visits    Treatment/Interventions  Aspiration precaution training;Pharyngeal strengthening exercises;Diet toleration management by SLP;Trials of upgraded texture/liquids;Internal/external aids;Multimodal communcation approach;Patient/family education;Compensatory strategies;SLP instruction and feedback;Cueing hierarchy;Environmental controls;Functional tasks    Potential to Achieve Goals  Good    Potential Considerations  Severity of impairments    Consulted and Agree with Plan of Care  Patient       Patient will benefit from skilled therapeutic intervention in order to improve the following deficits and impairments:   Dysphagia, oropharyngeal phase  Dysarthria and anarthria    Problem List Patient Active Problem List   Diagnosis Date Noted  . Parkinsonism ([place]) 07/30/2017  . 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 Agitation 11/18/2014  . Complicated migraine 37/85/8850  . Cognitive and behavioral changes 11/05/2014  . Altered mental status   . History of migraine headaches    . Acute CVA (cerebrovascular accident) ([place])  10/27/2014  . Aphasia   . Thoracic aortic aneurysm ([place]) 11/04/2013  . Aortic insufficiency 11/04/2013  . Obstructive sleep apnea 11/04/2013    [facility] ,[place], CCC-SLP  05/06/2018, 4:42 PM  [place] [address] [place], [place], [address] Phone: [phone]   Fax:  [phone]   Name: Travanti Mcmanus MRN: [id number] Date of Birth: 1941/04/22

## 2018-05-06 NOTE — Patient Instructions (Addendum)
   WHEN YOU EAT:  Small sips and small bites Swallow twice - HARD! Clear your throat "hock"  SWALLOWING EXERCISES   1. Effortful Swallows - Press your tongue against the roof of your mouth for 3 seconds, then squeeze          the muscles in your neck while you swallow your saliva or a sip of water - Repeat 20 times, 2-3 times a day, and use whenever you eat or drink  2. [place] Swallow - swallow with your tongue sticking out - Stick tongue out past your teeth and gently bite tongue with your teeth - Swallow, while holding your tongue with your teeth - Repeat 20 times, 2-3 times a day *use a wet spoon if your mouth gets dry*  3. Pitch Raise - Repeat "he", once per second starting at a low pitch and rising to as high of a pitch as you can - Repeat 20 times, 2-3 times a day  4. Mendelsohn Maneuver - "half swallow" exercise - Start to swallow, and keep your Adam's apple up by squeezing hard with the            muscles of the throat - Hold the squeeze for 5-7 seconds and then relax - Repeat 20 times, 2-3 times a day *use a wet spoon if your mouth gets dry*   5. Towel hold  Hold an approx 4" in diameter towel underneath your chin with a TIGHT squeeze for 45-60 seconds  -repeat 3-5 times, 2-3 times a day

## 2018-05-06 NOTE — Therapy (Signed)
[facility] [address] [place], [place], [address] Phone: [phone]   Fax:  [phone]  Occupational Therapy Evaluation  Patient Details  Name: Christopher Atkinson MRN: [id number] Date of Birth: September 19, 1941 Referring Provider: Dr. Carles Collet    Encounter Date: 05/06/2018  OT End of Session - 05/06/18 0954    Visit Number  1    Number of Visits  17    Date for OT Re-Evaluation  07/05/18    Authorization Type  Medicare    Authorization Time Period  2x week x 8 weeks with 12 week cert period    OT Start Time  0850    OT Stop Time  0940    OT Time Calculation (min)  50 min    Behavior During Therapy  [facility] for tasks assessed/performed       Past Medical History:  Diagnosis Date  . Aortic insufficiency   . Headache   . Obstructive sleep apnea    on CPAP  . Thoracic aortic aneurysm [facility])     Past Surgical History:  Procedure Laterality Date  . BACK SURGERY    . CATARACT EXTRACTION, BILATERAL    . CHOLECYSTECTOMY    . HERNIA REPAIR    . HERNIA REPAIR    . HERNIA REPAIR    . NOSE SURGERY      There were no vitals filed for this visit.  Subjective Assessment - 05/06/18 0857    Subjective   Pt reports increased difficulty with dressing    Pertinent History  PMH: Parkinson's disease, CVA, migraines, thoracic aortic aneurysm, sleep apnea, migraines    Patient Stated Goals  To maintain his independence    Currently in Pain?  No/denies        [facility] OT Assessment - 05/06/18 0859      Assessment   Medical Diagnosis  Parkinson's disease    Referring Provider  Dr. Carles Collet     Onset Date/Surgical Date  04/08/18    Prior Therapy  PT      Precautions   Precautions  Fall    Precaution Comments  Is not currently driving (wife does not want him to)      Balance Screen   Has the patient fallen in the past 6 months  Yes    How many times?  once    Has the patient had a decrease in activity level because of a fear of falling?    Yes    Is the patient reluctant to leave their home because of a fear of falling?   No      Home  Environment   Family/patient expects to be discharged to:  Private residence    Type of [place]  One level    Alternate Level Stairs - Number of Steps  4    Bathroom Shower/Tub  Walk-in Shower grab bars    Lives With  Spouse      Prior Function   Level of Independence  Independent with basic ADLs    Vocation  Retired    Leisure  watch TV      ADL   Eating/Feeding  Minimal assistance increased spills, wife feeds him occaisionally    Grooming  Modified independent    Upper Body Bathing  Supervision/safety    Lower Body Bathing  Supervision/safety    Upper Body Dressing  Minimal assistance;Needs assist for fasteners  Lower Body Dressing  Minimal assistance;Increased time    Toilet Transfer  Modified independent    Tub/Shower Transfer  Supervision/safety distant      IADL   Shopping  Needs to be accompanied on any shopping trip    Light Housekeeping  Performs light daily tasks such as dishwashing, bed making    Meal Prep  Needs to have meals prepared and served    Medication Management  Has difficulty remembering to take medication    Financial Management  Dependent      Mobility   Mobility Status  -- supervision, arrived without rollator today      Written Expression   Dominant Hand  Right    Handwriting  Mild micrographia;Increased time Legible for a sentence       Vision - History   Visual History  -- Blurry vision has glasses but he does not wear them,       Vision Assessment   Vision Assessment  Vision not tested    Comment  history of cataracts, pt reports blurry vision at times, he is supposed to wear glasses but he does not consistently      Cognition   Overall Cognitive Status  Impaired/Different from baseline Cognition to be assessed further in a functional context    Area of Impairment  Attention    Memory  Impaired     Memory Impairment  Decreased short term memory    [place]  23/30 tested at Dr. Doristine Devoid office    Cognition Comments  deleyed processing speed      Observation/Other Assessments   Other Surveys   Select    Physical Performance Test    Yes    Simulated Eating Time (seconds)  16.32 secs    Donning Doffing Jacket Time (seconds)  1 min 23 secs    Donning Doffing Jacket Comments  3 button/ unbutton 1 min 19 secs      Sensation   Light Touch  Appears Intact      Coordination   Gross Motor Movements are Fluid and Coordinated  No    Fine Motor Movements are Fluid and Coordinated  No    9 Hole Peg Test  Right;Left    Right 9 Hole Peg Test  60.28 secs    Left 9 Hole Peg Test  49.78 secs    Box and Blocks  RUE 26 blocks, LUE 32 blocks    Tremors  bilateral UE's resting tremor      Tone   Assessment Location  Right Upper Extremity;Left Upper Extremity      ROM / Strength   AROM / PROM / Strength  AROM      AROM   Overall AROM   Deficits    Overall AROM Comments  RUE shoulder flexion 110,elbow extension  -5, LUE shoulder flexion 110, elbow extension -20 decreased bilateral supination grossly 90%, decreased composite finger extension RUE        RUE Tone   RUE Tone  Mild      LUE Tone   LUE Tone  Mild                        OT Short Term Goals - 05/06/18 1010      OT SHORT TERM GOAL #1   Title  I with HEP    Time  4    Period  Weeks    Status  New    Target Date  06/05/18  OT SHORT TERM GOAL #2   Title  Pt/ wife will be I with adapted strategies for ADLs/IADLS    Time  4    Period  Weeks    Status  New      OT SHORT TERM GOAL #3   Title  Pt will demonstrate improved fine motor coordination for ADLS as evidenced by decreaseing bilteral 9 hole peg test score by 5 secs.    Baseline  RUE 60.28 secs, LUE 49.78 secs    Time  4    Period  Weeks    Status  New      OT SHORT TERM GOAL #4   Title  Pt will demonstrate improved ease with dressing as eveidenced by  decreasing PPT#4 to 75 secs of less.    Baseline  1 min 23 secs    Time  4    Period  Weeks    Status  New      OT SHORT TERM GOAL #5   Title  Pt will demonstate ability to retrieve a lightweight object with RUE at 120 shoulder flexion.    Time  8    Period  Weeks    Status  New      Additional Short Term Goals   Additional Short Term Goals  Yes      OT SHORT TERM GOAL #6   Title  Pt will demonstrate ability to retrieve a lightweight object at 120 shoulder flexion with -15 elbow extension with LUE.    Time  8    Period  Weeks    Status  New        OT Long Term Goals - 05/06/18 1015      OT LONG TERM GOAL #1   Title  Pt will demonstrate improved ease with fastening buttons as evidenced by decreasing 3 button / unbutton to 70 secs or less.    Baseline  79 secs    Time  8    Period  Weeks    Status  New    Target Date  07/05/18      OT LONG TERM GOAL #2   Title  pt will demonstrate increased RUE functional use for ADLS as evidenced by increasing RUE box/ blocks score to 30 blocks    Baseline  RUE 26 blocks, LUE 32 blocks    Time  8    Period  Weeks    Status  New      OT LONG TERM GOAL #3   Title  Pt/ wife will verbalize understanding of community resources and ways to prevent future PD related complications.     Time  8    Period  Weeks    Status  New      OT LONG TERM GOAL #4   Title  Pt / wife will verbalize understanding of ways to keep thinking skills sharp and compensatory strategies for cognitive changes associated with PD.    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 He has had a decline in mobility and ADL performance He presents to OP OT with decreased coordination, rigidity, cognitive deficits, tremor,  decreased balance,bradykinesia,  decreased timing and coordination of gait with festination/freezing with turns, abnormal posture. Pt can benefit from skilled occupational therapy to address these deficits  in order to maximize safety and independence with ADLs/ IADLs    Occupational Profile and client history currently impacting functional performance  PMH: Parkinson's disease, CVA, migraines, thoracic aortic aneurysm, sleep apnea, migraines    Occupational performance deficits (Please refer to evaluation for details):  ADL's;IADL's;Education;Play;Leisure;Social Participation    Rehab Potential  Good    Current Impairments/barriers affecting progress:  cogntive deficits, abnormal posture, bradykinesia, abnormal posture    OT Frequency  2x / week plus eval    OT Duration  8 weeks    OT Treatment/Interventions  Self-care/ADL training;Therapeutic exercise;Visual/perceptual remediation/compensation;Patient/family education;Neuromuscular education;Paraffin;Moist Heat;Fluidtherapy;Energy conservation;Therapist, nutritional;Therapeutic activities;Balance training;Cognitive remediation/compensation;Passive range of motion;Manual Therapy;DME and/or AE instruction;Ultrasound;Cryotherapy    Plan  initate coordination HEP, PWR! seated    Clinical Decision Making  Several treatment options, min-mod task modification necessary    Consulted and Agree with Plan of Care  Patient;Family member/caregiver    Family Member Consulted  wife       Patient will benefit from skilled therapeutic intervention in order to improve the following deficits and impairments:  Abnormal gait, Decreased cognition, Impaired flexibility, Decreased mobility, Decreased coordination, Decreased activity tolerance, Decreased endurance, Decreased range of motion, Decreased strength, Impaired UE functional use, Impaired perceived functional ability, Difficulty walking, Decreased safety awareness, Decreased knowledge of precautions,  Decreased balance  Visit Diagnosis: Other lack of coordination - Plan: Ot plan of care cert/re-cert  Muscle weakness (generalized) - Plan: Ot plan of care cert/re-cert  Other symptoms and signs involving the nervous system - Plan: Ot plan of care cert/re-cert  Abnormal posture - Plan: Ot plan of care cert/re-cert  Other symptoms and signs involving the musculoskeletal system - Plan: Ot plan of care cert/re-cert  Other abnormalities of gait and mobility - Plan: Ot plan of care cert/re-cert  Unsteadiness on feet - Plan: Ot plan of care cert/re-cert  Frontal lobe and executive function deficit - Plan: Ot plan of care cert/re-cert    Problem List Patient Active Problem List   Diagnosis Date Noted  . Parkinsonism ([place]) 07/30/2017  . 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 Agitation 11/18/2014  . Complicated migraine 56/21/3086  . Cognitive and behavioral changes 11/05/2014  . Altered mental status   . History of migraine headaches   . Acute CVA (cerebrovascular accident) ([place]) 10/27/2014  . Aphasia   . Thoracic aortic aneurysm ([place]) 11/04/2013  . Aortic insufficiency 11/04/2013  . Obstructive sleep apnea 11/04/2013    RINE,KATHRYN 05/06/2018, 12:12 PM Theone Murdoch, OTR/L [phone] Phone: [phone] 12:12 PM 05/06/18 [facility] [address] [place], [place], [address] Phone: [phone]   Fax:  [phone]  Name: Christopher Atkinson MRN: [id number] Date of Birth: 23-Jun-1941

## 2018-05-08 ENCOUNTER — Ambulatory Visit: Payer: PPO | Admitting: Physical Therapy

## 2018-05-08 ENCOUNTER — Encounter: Payer: Self-pay | Admitting: Physical Therapy

## 2018-05-08 DIAGNOSIS — R2689 Other abnormalities of gait and mobility: Secondary | ICD-10-CM

## 2018-05-08 DIAGNOSIS — R293 Abnormal posture: Secondary | ICD-10-CM

## 2018-05-08 DIAGNOSIS — M6281 Muscle weakness (generalized): Secondary | ICD-10-CM

## 2018-05-08 NOTE — Patient Instructions (Addendum)
Tips to reduce freezing episodes with standing or walking:  1. Stand tall with your feet wide, so that you can rock and weight shift through your hips. 2. Don't try to fight the freeze: if you begin taking slower, faster, smaller steps, STOP, get your posture tall, and RESET your posture and balance.  Take a deep breath before taking the BIG step to start again. 3. March in place, with high knee stepping, to get started walking again. 4. Use auditory cues:  Count out loud, think of a familiar tune or song or cadence, use pocket metronome, to use rhythm to get started walking again. 5. Use visual cues:  Use a line to step over, use laser pointer line to step over, (using BIG steps) to start walking again. 6. Use visual targets to keep your posture tall (look ahead and focus on an object or target at eye level). 7. As you approach where your destination with walking, count your steps out loud and/or focus on your target with your eyes until you are fully there. 8. Use appropriate assistive device, as advised by your physical therapist to assist with taking longer, consistent steps.     For Sit to stand:   1.  Scoot out to the edge of the chair 2.  Place your feet flat on the floor, shoulder width apart.  Make sure your feet are tucked just under your knees. 3.  Lean forward (nose over toes) with momentum, and stand up tall with your best posture.  If you need to use your arms, use them as a quick boost up to stand. 4.  If you are in a low or soft chair, you can lean back and then forward up to stand, in order to get more momentum. 5.  Once you are standing, make sure you are looking ahead and standing tall.  To sit down:  1. Back up until you feel the chair behind your legs. Bend at you hips, reaching  Back for you chair, if needed, then slowly squat to sit down on your chair.   Sit to Stand REPS: 10  SETS: 2  WEEKLY: 7x  DAILY: 1x Setup Begin sitting upright with your feet flat on the  ground underneath your knees. Movement Move your shoulders and head over your toes, bring your knees forward, and allow your hips to come off the chair, then push down equally into both feet to stand up. Sit back down and repeat. Tip Make sure to keep your weight evenly distributed between both legs, and try to keep your back straight throughout the exercise. Do not lock out your knees once you are standing. Prepared by Mady Haagensen [address] [place], [place] Access your exercises! [email or web address] Your Access Code: [email or web address] Disclaimer: This

## 2018-05-08 NOTE — Therapy (Signed)
[facility] [address] [place], [place], [address] Phone: [phone]   Fax:  [phone]  Physical Therapy Treatment  Patient Details  Name: Christopher Atkinson MRN: [id number] Date of Birth: 1941-09-14 Referring Provider: Dr. Carles Collet    Encounter Date: 05/08/2018  PT End of Session - 05/08/18 1256    Visit Number  2    Number of Visits  17    Date for PT Re-Evaluation  07/30/18    Authorization Type  [employer]    PT Start Time  0850    PT Stop Time  0932    PT Time Calculation (min)  42 min    Activity Tolerance  Patient tolerated treatment well    Behavior During Therapy  [facility] for tasks assessed/performed       Past Medical History:  Diagnosis Date  . Aortic insufficiency   . Headache   . Obstructive sleep apnea    on CPAP  . Thoracic aortic aneurysm [facility])     Past Surgical History:  Procedure Laterality Date  . BACK SURGERY    . CATARACT EXTRACTION, BILATERAL    . CHOLECYSTECTOMY    . HERNIA REPAIR    . HERNIA REPAIR    . HERNIA REPAIR    . NOSE SURGERY      There were no vitals filed for this visit.  Subjective Assessment - 05/08/18 0852    Subjective  No falls.  "I've decided not to fall anymore."    Patient is accompained by:  Family member wife    Patient Stated Goals  Pt's goals for therapy are to be able to walk better and stand straighter.    Currently in Pain?  No/denies                       [facility] Adult PT Treatment/Exercise - 05/08/18 0001      Transfers   Transfers  Sit to Stand;Stand to Sit    Sit to Stand  5: Supervision;Without upper extremity assist;From chair/3-in-1;From bed hands positioned on knees    Sit to Stand Details  Verbal cues for technique;Verbal cues for sequencing    Stand to Sit  5: Supervision;With upper extremity assist;To bed;To chair/3-in-1    Stand to Sit Details (indicate cue type and reason)  Verbal cues for sequencing;Verbal cues for technique     Stand to Sit Details  Cues for full turn to sit, including practice with rocking to fully turn to sit    Number of Reps  10 reps from 20" mat, then from 18" chair    Comments  Upon standing, lateral weightshifting to start gait with BIG steps      Ambulation/Gait   Ambulation/Gait  Yes    Ambulation/Gait Assistance  5: Supervision;4: Min guard    Ambulation Distance (Feet)  100 Feet 230 ft, then 100 ft    Assistive device  None;Rollator    Gait Pattern  Step-through pattern;Decreased arm swing - right;Decreased arm swing - left;Decreased step length - right;Decreased dorsiflexion - right;Shuffle;Festinating;Decreased trunk rotation;Trunk flexed;Narrow base of support;Poor foot clearance - right    Ambulation Surface  Level;Indoor    Pre-Gait Activities  Recommended that patient ambulate with rollator to improve posture and step length    Gait Comments  Discussed and practiced tips to reduce freezing with gait      Posture/Postural Control   Posture Comments  Pt able to improve posture briefly with cues.  Performed seated  PWR! Up position with forward lean>upright posture x 10 reps             PT Education - 05/08/18 1255    Education provided  Yes    Education Details  Educated in [place] for sit<>stand, as well as tips to reduce freezing with gait    Person(s) Educated  Patient;Spouse    Methods  Explanation;Demonstration;Handout;Verbal cues    Comprehension  Verbalized understanding;Returned demonstration;Verbal cues required       PT Short Term Goals - 05/01/18 2147      PT SHORT TERM GOAL #1   Title  Pt will perform HEP with wife's supervision for improved functional mobility and balance.  TARGET 05/29/18    Time  4    Period  Weeks    Status  New    Target Date  05/29/18      PT SHORT TERM GOAL #2   Title  Pt will improve 5x sit<>stand to less than or equal to 17 seconds for improved efficiency and safety with gait.    Time  4    Period  Weeks    Status  New     Target Date  05/29/18      PT SHORT TERM GOAL #3   Title  Pt will improve TUG score to less than or equal to 20 seconds for decreased fall risk.    Time  4    Period  Weeks    Status  New    Target Date  05/29/18      PT SHORT TERM GOAL #4   Title  Pt/wife will verbalize understanding of tips to reduce freezing with gait and turns.    Time  4    Period  Weeks    Status  New    Target Date  05/29/18        PT Long Term Goals - 05/01/18 2150      PT LONG TERM GOAL #1   Title  Pt/wife will verbalize understanding of fall prevention in home environment.  TARGET    Time  8    Period  Weeks    Status  New    Target Date  06/26/18      PT LONG TERM GOAL #2   Title  Pt will improve 5x sit<>stand to less than or equal to 14 seconds for decreased fall risk.    Time  8    Period  Weeks    Status  New    Target Date  06/26/18      PT LONG TERM GOAL #3   Title  Pt will improve TUG score to less than or equal to 15 seconds ofr decreased fall risk.    Time  8    Period  Weeks    Status  New    Target Date  06/26/18      PT LONG TERM GOAL #4   Title  Pt will improve gait velocity to at least 2.62 ft/sec for improved gait efficiency and safety.    Time  8    Period  Weeks    Status  New    Target Date  06/26/18      PT LONG TERM GOAL #5   Title  Pt/wife will verbalize plans for ongoing community fitness upon d/c from PT.    Time  8    Period  Weeks    Status  New    Target Date  06/26/18  Plan - 05/08/18 1256    Clinical Impression Statement  Initiated HEP today to address sit<>stand transfers and functional strength.  Pt responds well to cueing for upright posture and for improved step length with gait.  PT recommends use of rollator walker for posture and balance; without device, pt not able to sustain upright posture.    Rehab Potential  Good    PT Frequency  2x / week    PT Duration  8 weeks plus eval    PT Treatment/Interventions  ADLs/Self Care Home  Management;DME Instruction;Gait training;Stair training;Functional mobility training;Therapeutic activities;Therapeutic exercise;Balance training;Neuromuscular re-education;Patient/family education    PT Next Visit Plan  Review Sit<>stand and turning to sit; tips to reduce freezing with gait; Try PWR! MOves sitting and standing at counter for posture, balance; gait activities with rollator    Consulted and Agree with Plan of Care  Patient;Family member/caregiver    Family Member Consulted  wife       Patient will benefit from skilled therapeutic intervention in order to improve the following deficits and impairments:  Abnormal gait, Decreased balance, Decreased mobility, Decreased safety awareness, Difficulty walking, Decreased strength, Postural dysfunction  Visit Diagnosis: Muscle weakness (generalized)  Abnormal posture  Other abnormalities of gait and mobility     Problem List Patient Active Problem List   Diagnosis Date Noted  . Parkinsonism ([place]) 07/30/2017  . 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 Agitation 11/18/2014  . Complicated migraine 16/60/6004  . Cognitive and behavioral changes 11/05/2014  . Altered mental status   . History of migraine headaches   . Acute CVA (cerebrovascular accident) ([place]) 10/27/2014  . Aphasia   . Thoracic aortic aneurysm ([place]) 11/04/2013  . Aortic insufficiency 11/04/2013  . Obstructive sleep apnea 11/04/2013    MARRIOTT,AMY W. 05/08/2018, 1:00 PM  Frazier Butt., PT   [facility] [address] [place], [place], [address] Phone: [phone]   Fax:  [phone]  Name: Derric Dealmeida MRN: [id number] Date of Birth: Jan 31, 1941

## 2018-05-14 ENCOUNTER — Ambulatory Visit: Payer: PPO | Admitting: Occupational Therapy

## 2018-05-14 ENCOUNTER — Encounter: Payer: Self-pay | Admitting: Speech Pathology

## 2018-05-14 ENCOUNTER — Ambulatory Visit: Payer: PPO | Admitting: Speech Pathology

## 2018-05-14 ENCOUNTER — Other Ambulatory Visit: Payer: Self-pay | Admitting: Neurology

## 2018-05-14 ENCOUNTER — Ambulatory Visit: Payer: PPO | Admitting: Physical Therapy

## 2018-05-14 DIAGNOSIS — R278 Other lack of coordination: Secondary | ICD-10-CM

## 2018-05-14 DIAGNOSIS — R29818 Other symptoms and signs involving the nervous system: Secondary | ICD-10-CM

## 2018-05-14 DIAGNOSIS — R1312 Dysphagia, oropharyngeal phase: Secondary | ICD-10-CM

## 2018-05-14 DIAGNOSIS — R471 Dysarthria and anarthria: Secondary | ICD-10-CM

## 2018-05-14 DIAGNOSIS — R2689 Other abnormalities of gait and mobility: Secondary | ICD-10-CM | POA: Diagnosis not present

## 2018-05-14 NOTE — Patient Instructions (Signed)
  Continue loud "AH!" 5x a day twice a day  Read loudly - think shout! 5 minutes  Continue swallow exercises and follow swallow precautions
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
## 2018-05-14 NOTE — Therapy (Signed)
[facility] [address] [place], [place], [address] Phone: [phone]   Fax:  [phone]  Speech Language Pathology Treatment  Patient Details  Name: Christopher Atkinson MRN: [id number] Date of Birth: [birth year] Referring Provider: Alonza Bogus, DO   Encounter Date: 05/14/2018  End of Session - 05/14/18 1208    Visit Number  3    Number of Visits  17    Date for SLP Re-Evaluation  07/10/18    SLP Start Time  1016    SLP Stop Time   1100    SLP Time Calculation (min)  44 min    Activity Tolerance  Patient tolerated treatment well       Past Medical History:  Diagnosis Date  . Aortic insufficiency   . Headache   . Obstructive sleep apnea    on CPAP  . Thoracic aortic aneurysm [facility])     Past Surgical History:  Procedure Laterality Date  . BACK SURGERY    . CATARACT EXTRACTION, BILATERAL    . CHOLECYSTECTOMY    . HERNIA REPAIR    . HERNIA REPAIR    . HERNIA REPAIR    . NOSE SURGERY      There were no vitals filed for this visit.  Subjective Assessment - 05/14/18 1030    Subjective  "Today I had a sneezing fit today after breakfast and went through the whole bag of tissues"    Currently in Pain?  Yes    Pain Score  3     Pain Location  Face    Pain Orientation  Left    Pain Descriptors / Indicators  Aching;Discomfort    Pain Type  Chronic pain    Pain Onset  More than a month ago    Pain Frequency  Intermittent    Aggravating Factors   cold, breeze    Pain Relieving Factors  meds    Multiple Pain Sites  No            ADULT SLP TREATMENT - 05/14/18 1035      General Information   Behavior/Cognition  Alert;Cooperative;Lethargic      Treatment Provided   Treatment provided  Dysphagia;Cognitive-Linquistic      Dysphagia Treatment   Temperature Spikes Noted  No    Respiratory Status  Room air    Patient observed directly with PO's  Yes    Type of PO's observed  Thin liquids    Amount of  cueing  Total    Other treatment/comments  Spouse not present - at dentist. Pt required frequent max A to complete HEP for dysphagia and questioning cues to verbalize swallow precautions.  Pt required frequent cues for multiple swallows and throat clears.       Cognitive-Linquistic Treatment   Treatment focused on  Dysarthria    Skilled Treatment  Loud /a/ with max A for appropriate phonation and steady pitch without strain. Averaged 84dB.  Pt forgot his glasses. Rote speech tasks to facilitate volume (pledge, Lord's prayer) with frequent mod A to generate volumem of 70dB - Overall averged 65dB.       Assessment / Recommendations / Plan   Plan  Continue with current plan of care      Dysphagia Recommendations   Diet recommendations  Dysphagia 3 (mechanical soft);Thin liquid    Liquids provided via  Cup    Medication Administration  Whole meds with liquid    Supervision  Full supervision/cueing for compensatory strategies  Compensations  Effortful swallow;Clear throat intermittently;Multiple dry swallows after each bite/sip;Small sips/bites;Slow rate      Progression Toward Goals   Progression toward goals  Progressing toward goals       SLP Education - 05/14/18 1205    Education provided  Yes    Education Details  HEP for dysphagia, loud /a/, swallow precautoins    Person(s) Educated  Patient;Spouse    Methods  Explanation;Demonstration;Verbal cues    Comprehension  Verbalized understanding;Returned demonstration;Verbal cues required;Need further instruction       SLP Short Term Goals - 05/14/18 1207      SLP SHORT TERM GOAL #1   Title  pt will follow swallow precautions in 80% of opportunities during session with appropriate POs    Time  3    Period  Weeks or 9 total visits, for all STGs    Status  On-going      SLP SHORT TERM GOAL #2   Title  pt will demo swallowing HEP with occaisonal min A over two sessions    Time  3    Period  Weeks    Status  On-going      SLP  SHORT TERM GOAL #3   Title  pt will produce /a/ with average upper 80s over 4 sessions    Time  3    Period  Weeks    Status  On-going      SLP SHORT TERM GOAL #4   Title  pt will participate in 3 minutes simple conversation with average volume 69 dB over two sessions    Time  3    Period  Weeks    Status  On-going      SLP SHORT TERM GOAL #5   Title  pt will answer simple questions with average 70dB in two therapy sessions    Time  3    Period  Weeks    Status  On-going       SLP Long Term Goals - 05/14/18 1207      SLP LONG TERM GOAL #1   Title  pt will follow swallow precautions >/= 90% of the time with written cues    Time  7    Period  Weeks or 17 total sessions, for all LTGs    Status  On-going      SLP LONG TERM GOAL #2   Title  pt will produce loud /a/ average upper 80s over 6 sessions    Time  7    Period  Weeks    Status  On-going      SLP LONG TERM GOAL #3   Title  pt will participate in 5+ minutes simple conversation with average volume in low 70s dB with occasional nonverbal cues    Time  7    Period  Weeks    Status  On-going      SLP LONG TERM GOAL #4   Title  pt/family will tell 3 overt s/s aspiration PNA    Time  7    Period  Weeks    Status  On-going       Plan - 05/14/18 1206    Clinical Impression Statement  spouse not present today - Pt required mod to max A for HEP for dysphagia and to carryover volume in structured tasks. Continue skilled ST to maximize intellgilbity and safety of PO intake.     Speech Therapy Frequency  2x / week    Treatment/Interventions  Aspiration precaution  training;Pharyngeal strengthening exercises;Diet toleration management by SLP;Trials of upgraded texture/liquids;Internal/external aids;Multimodal communcation approach;Patient/family education;Compensatory strategies;SLP instruction and feedback;Cueing hierarchy;Environmental controls;Functional tasks    Potential Considerations  Severity of impairments     Consulted and Agree with Plan of Care  Patient       Patient will benefit from skilled therapeutic intervention in order to improve the following deficits and impairments:   Dysphagia, oropharyngeal phase  Dysarthria and anarthria    Problem List Patient Active Problem List   Diagnosis Date Noted  . Parkinsonism ([place]) 07/30/2017  . 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 Agitation 11/18/2014  . Complicated migraine 31/51/7616  . Cognitive and behavioral changes 11/05/2014  . Altered mental status   . History of migraine headaches   . Acute CVA (cerebrovascular accident) ([place]) 10/27/2014  . Aphasia   . Thoracic aortic aneurysm ([place]) 11/04/2013  . Aortic insufficiency 11/04/2013  . Obstructive sleep apnea 11/04/2013    Tyton Abdallah, Annye Rusk MS, CCC-SLP 05/14/2018, 12:09 PM  [facility] [address] [place], [place], [address] Phone: [phone]   Fax:  [phone]   Name: Erwin Nishiyama MRN: [id number] Date of Birth: 08-17-1941

## 2018-05-14 NOTE — Patient Instructions (Signed)
Coordination Exercises  Perform the following exercises for 10 minutes 1-2 times per day. Perform with both hand(s). Perform using big movements.   Flipping Cards: Place deck of cards on the table. Flip cards over by opening your hand big to grasp and then turn your palm up big.  Deal cards: Hold 1/2 or whole deck in your hand. Use thumb to push card off top of deck with one big push.  Rotate ball with fingertips: Pick up with fingers/thumb and move as much as you can with each turn/movement (clockwise and counter-clockwise).  Toss ball in the air and catch with the same hand: Toss big/high.  Pick up coins and stack one at a time: Pick up with big, intentional movements. Do not drag coin to the edge. (5-10 in a stack)  Pick up 5-10 coins one at a time and hold in palm. Then, move coins from palm to fingertips one at time and place in coin bank/container.  Perform "Flicks"/hand stretches (PWR! Hands): Close hands then flick out your fingers with focus on opening hands, pulling wrists back, and extending elbows like you are pushing.  Fasten nuts/bolts or put on bottle caps: Turn as much/as big as you can with each turn.

## 2018-05-15 ENCOUNTER — Encounter: Payer: Self-pay | Admitting: Physical Therapy

## 2018-05-15 ENCOUNTER — Ambulatory Visit: Payer: PPO | Admitting: Physical Therapy

## 2018-05-15 DIAGNOSIS — R2689 Other abnormalities of gait and mobility: Secondary | ICD-10-CM

## 2018-05-15 DIAGNOSIS — M6281 Muscle weakness (generalized): Secondary | ICD-10-CM

## 2018-05-15 DIAGNOSIS — R293 Abnormal posture: Secondary | ICD-10-CM

## 2018-05-15 NOTE — Patient Instructions (Signed)
-  Handout provided for PWR! Moves in standing; PWR! Hands

## 2018-05-15 NOTE — Therapy (Signed)
[facility] [address] [place], [place], [address] Phone: [phone]   Fax:  [phone]  Physical Therapy Treatment  Patient Details  Name: Christopher Atkinson MRN: [id number] Date of Birth: 1941-07-12 Referring Provider: Dr. Carles Collet    Encounter Date: 05/15/2018  PT End of Session - 05/15/18 1012    Visit Number  3    Number of Visits  17    Date for PT Re-Evaluation  07/30/18    Authorization Type  [employer]    PT Start Time  0850    PT Stop Time  0928    PT Time Calculation (min)  38 min    Activity Tolerance  Patient tolerated treatment well    Behavior During Therapy  [facility] for tasks assessed/performed       Past Medical History:  Diagnosis Date  . Aortic insufficiency   . Headache   . Obstructive sleep apnea    on CPAP  . Thoracic aortic aneurysm [facility])     Past Surgical History:  Procedure Laterality Date  . BACK SURGERY    . CATARACT EXTRACTION, BILATERAL    . CHOLECYSTECTOMY    . HERNIA REPAIR    . HERNIA REPAIR    . HERNIA REPAIR    . NOSE SURGERY      There were no vitals filed for this visit.  Subjective Assessment - 05/15/18 0852    Subjective  Woke up yesterday morning with some spider bits along my mid-section.  They are better today. No falls.    Patient is accompained by:  Family member wife    Patient Stated Goals  Pt's goals for therapy are to be able to walk better and stand straighter.    Currently in Pain?  No/denies                       [facility] Adult PT Treatment/Exercise - 05/15/18 0855      Transfers   Transfers  Sit to Stand;Stand to Sit    Sit to Stand  5: Supervision;Without upper extremity assist;From chair/3-in-1;From bed    Stand to Sit  5: Supervision;With upper extremity assist;To bed;To chair/3-in-1    Stand to Sit Details  Cues for full turn to sit    Number of Reps  2 sets;Other reps (comment) 5 reps-cues for upright posture upon standing    Transfer  Cueing  Additional 5 reps of sit<>Stand, from either mat, chair or Nu-step with cues for increased forward lean to stand upright and tall.    Comments  Upon standing, lateral weightshifting to start gait with BIG steps      Ambulation/Gait   Ambulation/Gait  Yes    Ambulation/Gait Assistance  5: Supervision;4: Min guard    Ambulation/Gait Assistance Details  Cues for upright posture, cues for increased step length, cues for heelstrike.  About 50% of the time, pt able to self-correct posture and step length.    Ambulation Distance (Feet)  230 Feet rollator; 100 ft x 2 no device    Assistive device  None;Rollator    Gait Pattern  Step-through pattern;Decreased arm swing - right;Decreased arm swing - left;Decreased step length - right;Decreased dorsiflexion - right;Shuffle;Festinating;Decreased trunk rotation;Trunk flexed;Narrow base of support;Poor foot clearance - right    Ambulation Surface  Level;Indoor      Exercises   Exercises  Knee/Hip      Knee/Hip Exercises: Aerobic   Nustep  Level 2, 4 extremities x 5 minutes,  49-55 steps/minute for improved leg strength and flexibility.        PWR [facility]) - 05/15/18 8786    PWR! exercises  Moves in sitting;Hands    PWR! Up  x 10 reps    PWR! Up  x 10 reps    PWR! Rock  x 5 reps each side    PWR! Twist  2 sets of 3 reps each side    PWR! Step  x 5 reps each side    Comments  Visual and verbal cues for technique and increased effort          PT Education - 05/15/18 0931    Education provided  Yes    Education Details  HEP for PWR! Moves in sitting; PWR! Hands    Person(s) Educated  Patient;Spouse    Methods  Explanation;Demonstration;Verbal cues;Handout    Comprehension  Verbalized understanding;Returned demonstration;Verbal cues required       PT Short Term Goals - 05/01/18 2147      PT SHORT TERM GOAL #1   Title  Pt will perform HEP with wife's supervision for improved functional mobility and balance.  TARGET 05/29/18    Time  4     Period  Weeks    Status  New    Target Date  05/29/18      PT SHORT TERM GOAL #2   Title  Pt will improve 5x sit<>stand to less than or equal to 17 seconds for improved efficiency and safety with gait.    Time  4    Period  Weeks    Status  New    Target Date  05/29/18      PT SHORT TERM GOAL #3   Title  Pt will improve TUG score to less than or equal to 20 seconds for decreased fall risk.    Time  4    Period  Weeks    Status  New    Target Date  05/29/18      PT SHORT TERM GOAL #4   Title  Pt/wife will verbalize understanding of tips to reduce freezing with gait and turns.    Time  4    Period  Weeks    Status  New    Target Date  05/29/18        PT Long Term Goals - 05/01/18 2150      PT LONG TERM GOAL #1   Title  Pt/wife will verbalize understanding of fall prevention in home environment.  TARGET    Time  8    Period  Weeks    Status  New    Target Date  06/26/18      PT LONG TERM GOAL #2   Title  Pt will improve 5x sit<>stand to less than or equal to 14 seconds for decreased fall risk.    Time  8    Period  Weeks    Status  New    Target Date  06/26/18      PT LONG TERM GOAL #3   Title  Pt will improve TUG score to less than or equal to 15 seconds ofr decreased fall risk.    Time  8    Period  Weeks    Status  New    Target Date  06/26/18      PT LONG TERM GOAL #4   Title  Pt will improve gait velocity to at least 2.62 ft/sec for improved gait efficiency and safety.  Time  8    Period  Weeks    Status  New    Target Date  06/26/18      PT LONG TERM GOAL #5   Title  Pt/wife will verbalize plans for ongoing community fitness upon d/c from PT.    Time  8    Period  Weeks    Status  New    Target Date  06/26/18            Plan - 05/15/18 1013    Clinical Impression Statement  REviewed sit<>stand, with pt needing cues for upright posture upon standing.  Added to HEP seated PWR! Moves to help with postural strength and awareness.  Per wife,  pt has had these exercises (from [facility]) in the past, but is not doing regularly.  Pt conitnues to need frequent standing breaks to reset posture and step length.  Pt will continue to benefit from skilled PT to address posture, balance, and gait.    Rehab Potential  Good    PT Frequency  2x / week    PT Duration  8 weeks plus eval    PT Treatment/Interventions  ADLs/Self Care Home Management;DME Instruction;Gait training;Stair training;Functional mobility training;Therapeutic activities;Therapeutic exercise;Balance training;Neuromuscular re-education;Patient/family education    PT Next Visit Plan  Review HEP, work on standing exercises, posture exercises, gait with rollator    Consulted and Agree with Plan of Care  Patient;Family member/caregiver    Family Member Consulted  wife       Patient will benefit from skilled therapeutic intervention in order to improve the following deficits and impairments:  Abnormal gait, Decreased balance, Decreased mobility, Decreased safety awareness, Difficulty walking, Decreased strength, Postural dysfunction  Visit Diagnosis: Muscle weakness (generalized)  Abnormal posture  Other abnormalities of gait and mobility     Problem List Patient Active Problem List   Diagnosis Date Noted  . Parkinsonism ([place]) 07/30/2017  . 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 Agitation 11/18/2014  . Complicated migraine 89/37/3428  . Cognitive and behavioral changes 11/05/2014  . Altered mental status   . History of migraine headaches   . Acute CVA (cerebrovascular accident) ([place]) 10/27/2014  . Aphasia   . Thoracic aortic aneurysm ([place]) 11/04/2013  . Aortic insufficiency 11/04/2013  . Obstructive sleep apnea 11/04/2013    MARRIOTT,AMY W. 05/15/2018, 10:15 AM  Frazier Butt., PT   [facility] [address] [place], [place], [address] Phone: [phone]   Fax:  [phone]  Name:  Christopher Atkinson MRN: [id number] Date of Birth: [birth year]

## 2018-05-19 DIAGNOSIS — N3941 Urge incontinence: Secondary | ICD-10-CM | POA: Diagnosis not present

## 2018-05-20 ENCOUNTER — Ambulatory Visit: Payer: PPO | Admitting: Physical Therapy

## 2018-05-20 ENCOUNTER — Ambulatory Visit: Payer: PPO | Admitting: Occupational Therapy

## 2018-05-20 ENCOUNTER — Encounter: Payer: Self-pay | Admitting: Physical Therapy

## 2018-05-20 DIAGNOSIS — R29818 Other symptoms and signs involving the nervous system: Secondary | ICD-10-CM

## 2018-05-20 DIAGNOSIS — R2689 Other abnormalities of gait and mobility: Secondary | ICD-10-CM | POA: Diagnosis not present

## 2018-05-20 DIAGNOSIS — R278 Other lack of coordination: Secondary | ICD-10-CM

## 2018-05-20 DIAGNOSIS — R293 Abnormal posture: Secondary | ICD-10-CM

## 2018-05-20 DIAGNOSIS — R29898 Other symptoms and signs involving the musculoskeletal system: Secondary | ICD-10-CM

## 2018-05-20 DIAGNOSIS — M6281 Muscle weakness (generalized): Secondary | ICD-10-CM

## 2018-05-20 DIAGNOSIS — R41844 Frontal lobe and executive function deficit: Secondary | ICD-10-CM

## 2018-05-20 NOTE — Patient Instructions (Addendum)
Correction to last visits' HEP:  Provided handouts for PWR! Moves in sitting and for PWR! Hands  Modified seated PWR! Twist to axial trunk rotation x 5 reps each side

## 2018-05-20 NOTE — Therapy (Signed)
[facility] [address] [place], [place], [address] Phone: [phone]   Fax:  [phone]  Occupational Therapy Treatment  Patient Details  Name: Christopher [place] MRN: [id number] Date of Birth: [birth year] Referring Provider: Dr. Carles Collet    Encounter Date: 05/20/2018  OT End of Session - 05/20/18 0948    Visit Number  3    Number of Visits  17    Date for OT Re-Evaluation  07/05/18    Authorization Type  Medicare    Authorization Time Period  2x week x 8 weeks with 12 week cert period    OT Start Time  0931    OT Stop Time  1015    OT Time Calculation (min)  44 min    Activity Tolerance  Patient tolerated treatment well    Behavior During Therapy  [facility] for tasks assessed/performed       Past Medical History:  Diagnosis Date  . Aortic insufficiency   . Headache   . Obstructive sleep apnea    on CPAP  . Thoracic aortic aneurysm [facility])     Past Surgical History:  Procedure Laterality Date  . BACK SURGERY    . CATARACT EXTRACTION, BILATERAL    . CHOLECYSTECTOMY    . HERNIA REPAIR    . HERNIA REPAIR    . HERNIA REPAIR    . NOSE SURGERY      There were no vitals filed for this visit.  Subjective Assessment - 05/20/18 0933    Pertinent History  PMH: Parkinson's disease, CVA, migraines, thoracic aortic aneurysm, sleep apnea, migraines    Patient Stated Goals  To maintain his independence    Currently in Pain?  No/denies                           OT Education - 05/20/18 1218    Education provided  Yes    Education Details  Reviewed coordination HEP and PWR! hands with pt and wife, pt requires, mod/ max v.c for performance and increased time with entire session spent reinforcing big movement strategies with coordination HEP, pt's wife is able to assist    Person(s) Educated  Patient;Spouse    Methods  Explanation;Demonstration;Verbal cues    Comprehension  Returned demonstration;Verbalized  understanding;Verbal cues required       OT Short Term Goals - 05/06/18 1010      OT SHORT TERM GOAL #1   Title  I with HEP    Time  4    Period  Weeks    Status  New    Target Date  06/05/18      OT SHORT TERM GOAL #2   Title  Pt/ wife will be I with adapted strategies for ADLs/IADLS    Time  4    Period  Weeks    Status  New      OT SHORT TERM GOAL #3   Title  Pt will demonstrate improved fine motor coordination for ADLS as evidenced by decreaseing bilteral 9 hole peg test score by 5 secs.    Baseline  RUE 60.28 secs, LUE 49.78 secs    Time  4    Period  Weeks    Status  New      OT SHORT TERM GOAL #4   Title  Pt will demonstrate improved ease with dressing as eveidenced by decreasing PPT#4 to 75 secs of less.    Baseline  1 min  23 secs    Time  4    Period  Weeks    Status  New      OT SHORT TERM GOAL #5   Title  Pt will demonstate ability to retrieve a lightweight object with RUE at 120 shoulder flexion.    Time  8    Period  Weeks    Status  New      Additional Short Term Goals   Additional Short Term Goals  Yes      OT SHORT TERM GOAL #6   Title  Pt will demonstrate ability to retrieve a lightweight object at 120 shoulder flexion with -15 elbow extension with LUE.    Time  8    Period  Weeks    Status  New        OT Long Term Goals - 05/06/18 1015      OT LONG TERM GOAL #1   Title  Pt will demonstrate improved ease with fastening buttons as evidenced by decreasing 3 button / unbutton to 70 secs or less.    Baseline  79 secs    Time  8    Period  Weeks    Status  New    Target Date  07/05/18      OT LONG TERM GOAL #2   Title  pt will demonstrate increased RUE functional use for ADLS as evidenced by increasing RUE box/ blocks score to 30 blocks    Baseline  RUE 26 blocks, LUE 32 blocks    Time  8    Period  Weeks    Status  New      OT LONG TERM GOAL #3   Title  Pt/ wife will verbalize understanding of community resources and ways to prevent  future PD related complications.     Time  8    Period  Weeks    Status  New      OT LONG TERM GOAL #4   Title  Pt / wife will verbalize understanding of ways to keep thinking skills sharp and compensatory strategies for cognitive changes associated with PD.    Time  8    Period  Weeks    Status  New            Plan - 05/20/18 0955    Clinical Impression Statement  Pt requires repetition of big movements with functional activity.    Occupational performance deficits (Please refer to evaluation for details):  ADL's;IADL's;Education;Play;Leisure;Social Participation    Rehab Potential  Good    Current Impairments/barriers affecting progress:  cogntive deficits, abnormal posture, bradykinesia, abnormal posture    OT Frequency  2x / week    OT Duration  8 weeks    OT Treatment/Interventions  Self-care/ADL training;Therapeutic exercise;Visual/perceptual remediation/compensation;Patient/family education;Neuromuscular education;Paraffin;Moist Heat;Fluidtherapy;Energy conservation;Therapist, nutritional;Therapeutic activities;Balance training;Cognitive remediation/compensation;Passive range of motion;Manual Therapy;DME and/or AE instruction;Ultrasound;Cryotherapy    Plan  PWR! supine or PWR! seated, ADL strategies    Consulted and Agree with Plan of Care  Patient;Family member/caregiver    Family Member Consulted  wife       Patient will benefit from skilled therapeutic intervention in order to improve the following deficits and impairments:  Abnormal gait, Decreased cognition, Impaired flexibility, Decreased mobility, Decreased coordination, Decreased activity tolerance, Decreased endurance, Decreased range of motion, Decreased strength, Impaired UE functional use, Impaired perceived functional ability, Difficulty walking, Decreased safety awareness, Decreased knowledge of precautions, Decreased balance  Visit Diagnosis: Muscle weakness (generalized)  Other lack of  coordination  Other symptoms and signs involving the nervous system  Other symptoms and signs involving the musculoskeletal system  Frontal lobe and executive function deficit    Problem List Patient Active Problem List   Diagnosis Date Noted  . Parkinsonism ([place]) 07/30/2017  . 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 Agitation 11/18/2014  . Complicated migraine 73/41/9379  . Cognitive and behavioral changes 11/05/2014  . Altered mental status   . History of migraine headaches   . Acute CVA (cerebrovascular accident) ([place]) 10/27/2014  . Aphasia   . Thoracic aortic aneurysm ([place]) 11/04/2013  . Aortic insufficiency 11/04/2013  . Obstructive sleep apnea 11/04/2013    RINE,KATHRYN 05/20/2018, 12:22 PM  [facility] [address] [place], [place], [address] Phone: [phone]   Fax:  [phone]  Name: Graiden Henes MRN: [id number] Date of Birth: [birth year]

## 2018-05-20 NOTE — Therapy (Signed)
[facility] [address] [place], [place], [address] Phone: [phone]   Fax:  [phone]  Physical Therapy Treatment  Patient Details  Name: Christopher [place] MRN: [id number] Date of Birth: 27-Mar-1941 Referring Provider: Dr. Carles Collet    Encounter Date: 05/20/2018  PT End of Session - 05/20/18 1213    Visit Number  4    Number of Visits  17    Date for PT Re-Evaluation  07/30/18    Authorization Type  [employer]    PT Start Time  0852    PT Stop Time  0931    PT Time Calculation (min)  39 min    Activity Tolerance  Patient tolerated treatment well    Behavior During Therapy  [facility] for tasks assessed/performed       Past Medical History:  Diagnosis Date  . Aortic insufficiency   . Headache   . Obstructive sleep apnea    on CPAP  . Thoracic aortic aneurysm [facility])     Past Surgical History:  Procedure Laterality Date  . BACK SURGERY    . CATARACT EXTRACTION, BILATERAL    . CHOLECYSTECTOMY    . HERNIA REPAIR    . HERNIA REPAIR    . HERNIA REPAIR    . NOSE SURGERY      There were no vitals filed for this visit.  Subjective Assessment - 05/20/18 0854    Subjective  No changes since last visit.  No falls.  Been doing some of the exercises, but he gets tired easily (per wife).    Patient is accompained by:  Family member wife    Patient Stated Goals  Pt's goals for therapy are to be able to walk better and stand straighter.    Currently in Pain?  No/denies                       [facility] Adult PT Treatment/Exercise - 05/20/18 0001      Ambulation/Gait   Ambulation/Gait  Yes    Ambulation/Gait Assistance  5: Supervision;4: Min guard    Ambulation/Gait Assistance Details  Cues for upright posture, increased step length and foot clearance.    Ambulation Distance (Feet)  230 Feet then 50 ft x 4, then 110 ft    Assistive device  None    Gait Pattern  Step-through pattern;Decreased arm swing -  right;Decreased arm swing - left;Decreased step length - right;Decreased dorsiflexion - right;Shuffle;Festinating;Decreased trunk rotation;Trunk flexed;Narrow base of support;Poor foot clearance - right    Ambulation Surface  Level;Indoor    Pre-Gait Activities  Needs cues to stop and reset posture to start again with big steps; approx every 50 ft or so        PWR [facility]) - 05/20/18 0856    PWR! exercises  Hands;Moves in sitting    PWR! Up  x 5 reps, then 10 reps    Comments  PWR! Hands review of HEP given last visit    PWR! Up  x10 reps    PWR! Rock  x 8 reps each side    PWR! Twist  2 sets of 5 reps PWR! Twist with max verbal and visual cues    PWR! Step  x 5 reps bilateral legs stepping out; then x 5 reps single leg step out and in    Comments  Visual and verbal cues for technique and intensity.  Modified seated PWR! Twist to seated trunk rotation looking behind body and return  to middle, performed x 5 reps each side.       Balance Exercises - 05/20/18 0919      Balance Exercises: Standing   Marching Limitations  Marching in place 2 sets x 10 reps    Heel Raises Limitations  x10    Toe Raise Limitations  x 10    Other Standing Exercises  squats to stand upright posture, x 6 reps, side step and weightshift x 10 reps; seated hamstring stretch 3 x 30 seconds to promote hamstring flexibility for upright standing; forward step and weightshift x 10 reps each side(visual and verbal cues to incr. weightshift); back step and weightshift x 10 reps each side, visual, verbal, tactile cues for weightshift         PT Education - 05/20/18 1212    Education provided  Yes    Education Details  Replaced PWR! Twist in sitting with axial trunk rotation in sitting    Person(s) Educated  Patient;Spouse    Methods  Explanation;Demonstration;Handout    Comprehension  Verbalized understanding;Returned demonstration;Verbal cues required       PT Short Term Goals - 05/01/18 2147      PT SHORT TERM  GOAL #1   Title  Pt will perform HEP with wife's supervision for improved functional mobility and balance.  TARGET 05/29/18    Time  4    Period  Weeks    Status  New    Target Date  05/29/18      PT SHORT TERM GOAL #2   Title  Pt will improve 5x sit<>stand to less than or equal to 17 seconds for improved efficiency and safety with gait.    Time  4    Period  Weeks    Status  New    Target Date  05/29/18      PT SHORT TERM GOAL #3   Title  Pt will improve TUG score to less than or equal to 20 seconds for decreased fall risk.    Time  4    Period  Weeks    Status  New    Target Date  05/29/18      PT SHORT TERM GOAL #4   Title  Pt/wife will verbalize understanding of tips to reduce freezing with gait and turns.    Time  4    Period  Weeks    Status  New    Target Date  05/29/18        PT Long Term Goals - 05/01/18 2150      PT LONG TERM GOAL #1   Title  Pt/wife will verbalize understanding of fall prevention in home environment.  TARGET    Time  8    Period  Weeks    Status  New    Target Date  06/26/18      PT LONG TERM GOAL #2   Title  Pt will improve 5x sit<>stand to less than or equal to 14 seconds for decreased fall risk.    Time  8    Period  Weeks    Status  New    Target Date  06/26/18      PT LONG TERM GOAL #3   Title  Pt will improve TUG score to less than or equal to 15 seconds ofr decreased fall risk.    Time  8    Period  Weeks    Status  New    Target Date  06/26/18  PT LONG TERM GOAL #4   Title  Pt will improve gait velocity to at least 2.62 ft/sec for improved gait efficiency and safety.    Time  8    Period  Weeks    Status  New    Target Date  06/26/18      PT LONG TERM GOAL #5   Title  Pt/wife will verbalize plans for ongoing community fitness upon d/c from PT.    Time  8    Period  Weeks    Status  New    Target Date  06/26/18            Plan - 05/20/18 1213    Clinical Impression Statement  Throughout session, pt needs  cues to slow pace and move in bigger movement patterns.  Reviewed HEP and worked on standing exercises, with pt needing cues to reset posture frequently.    Rehab Potential  Good    PT Frequency  2x / week    PT Duration  8 weeks plus eval    PT Treatment/Interventions  ADLs/Self Care Home Management;DME Instruction;Gait training;Stair training;Functional mobility training;Therapeutic activities;Therapeutic exercise;Balance training;Neuromuscular re-education;Patient/family education    PT Next Visit Plan   work on standing exercises, posture exercises, gait with rollator    Consulted and Agree with Plan of Care  Patient;Family member/caregiver    Family Member Consulted  wife       Patient will benefit from skilled therapeutic intervention in order to improve the following deficits and impairments:  Abnormal gait, Decreased balance, Decreased mobility, Decreased safety awareness, Difficulty walking, Decreased strength, Postural dysfunction  Visit Diagnosis: Abnormal posture  Other abnormalities of gait and mobility  Other symptoms and signs involving the nervous system     Problem List Patient Active Problem List   Diagnosis Date Noted  . 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 Agitation 11/18/2014  . Complicated migraine 67/34/1937  . Cognitive and behavioral changes 11/05/2014  . Altered mental status   . History of migraine headaches   . Acute CVA (cerebrovascular accident) ([place]) 10/27/2014  . Aphasia   . Thoracic aortic aneurysm ([place]) 11/04/2013  . Aortic insufficiency 11/04/2013  . Obstructive sleep apnea 11/04/2013    MARRIOTT,AMY W. 05/20/2018, 12:16 PM Frazier Butt., PT  [facility] [address] [place], [place], [address] Phone: [phone]   Fax:  [phone]  Name: Christopher [place] MRN: [id number] Date of Birth: 03/06/1941

## 2018-05-22 ENCOUNTER — Encounter: Payer: Self-pay | Admitting: Physical Therapy

## 2018-05-22 ENCOUNTER — Ambulatory Visit: Payer: PPO | Admitting: Occupational Therapy

## 2018-05-22 ENCOUNTER — Ambulatory Visit: Payer: PPO | Admitting: Physical Therapy

## 2018-05-22 DIAGNOSIS — R29818 Other symptoms and signs involving the nervous system: Secondary | ICD-10-CM

## 2018-05-22 DIAGNOSIS — M6281 Muscle weakness (generalized): Secondary | ICD-10-CM

## 2018-05-22 DIAGNOSIS — R2689 Other abnormalities of gait and mobility: Secondary | ICD-10-CM | POA: Diagnosis not present

## 2018-05-22 DIAGNOSIS — R29898 Other symptoms and signs involving the musculoskeletal system: Secondary | ICD-10-CM

## 2018-05-22 DIAGNOSIS — R278 Other lack of coordination: Secondary | ICD-10-CM

## 2018-05-22 DIAGNOSIS — R293 Abnormal posture: Secondary | ICD-10-CM

## 2018-05-22 NOTE — Therapy (Signed)
[facility] [address] [place], [place], [address] Phone: [phone]   Fax:  [phone]  Occupational Therapy Treatment  Patient Details  Name: Christopher [place] MRN: [id number] Date of Birth: [birth year] Referring Provider: Dr. Carles Collet    Encounter Date: 05/22/2018  OT End of Session - 05/22/18 0844    Visit Number  4    Number of Visits  17    Date for OT Re-Evaluation  07/05/18    Authorization Type  Medicare    Authorization Time Period  2x week x 8 weeks with 12 week cert period    OT Start Time  0805    OT Stop Time  0845    OT Time Calculation (min)  40 min    Activity Tolerance  Patient tolerated treatment well    Behavior During Therapy  [facility] for tasks assessed/performed       Past Medical History:  Diagnosis Date  . Aortic insufficiency   . Headache   . Obstructive sleep apnea    on CPAP  . Thoracic aortic aneurysm [facility])     Past Surgical History:  Procedure Laterality Date  . BACK SURGERY    . CATARACT EXTRACTION, BILATERAL    . CHOLECYSTECTOMY    . HERNIA REPAIR    . HERNIA REPAIR    . HERNIA REPAIR    . NOSE SURGERY      There were no vitals filed for this visit.  Subjective Assessment - 05/22/18 0806    Subjective   Pt reports intermittant back pain    Pertinent History  PMH: Parkinson's disease, CVA, migraines, thoracic aortic aneurysm, sleep apnea, migraines    Patient Stated Goals  To maintain his independence    Currently in Pain?  Yes    Pain Score  5     Pain Location  Back    Pain Descriptors / Indicators  Aching    Pain Type  Chronic pain    Pain Onset  More than a month ago    Pain Frequency  Intermittent    Aggravating Factors   standing and bending forward    Pain Relieving Factors  meds           Treatment: Seated dynamic functional reaching with RUE to place graded clothespins on vertical antennae, min-mod v.c for posture and big movement  patterns.                OT Education - 05/22/18 [phone]    Education provided  Yes    Education Details  PWR! basic 4, 10 reps each, modified step to have hip lift and stepping separate, mod v.c and demo- see pt instructions,     Person(s) Educated  Patient;Spouse    Methods  Explanation;Demonstration;Verbal cues;Handout    Comprehension  Verbalized understanding;Returned demonstration;Verbal cues required       OT Short Term Goals - 05/06/18 1010      OT SHORT TERM GOAL #1   Title  I with HEP    Time  4    Period  Weeks    Status  New    Target Date  06/05/18      OT SHORT TERM GOAL #2   Title  Pt/ wife will be I with adapted strategies for ADLs/IADLS    Time  4    Period  Weeks    Status  New      OT SHORT TERM GOAL #3   Title  Pt will  demonstrate improved fine motor coordination for ADLS as evidenced by decreaseing bilteral 9 hole peg test score by 5 secs.    Baseline  RUE 60.28 secs, LUE 49.78 secs    Time  4    Period  Weeks    Status  New      OT SHORT TERM GOAL #4   Title  Pt will demonstrate improved ease with dressing as eveidenced by decreasing PPT#4 to 75 secs of less.    Baseline  1 min 23 secs    Time  4    Period  Weeks    Status  New      OT SHORT TERM GOAL #5   Title  Pt will demonstate ability to retrieve a lightweight object with RUE at 120 shoulder flexion.    Time  8    Period  Weeks    Status  New      Additional Short Term Goals   Additional Short Term Goals  Yes      OT SHORT TERM GOAL #6   Title  Pt will demonstrate ability to retrieve a lightweight object at 120 shoulder flexion with -15 elbow extension with LUE.    Time  8    Period  Weeks    Status  New        OT Long Term Goals - 05/06/18 1015      OT LONG TERM GOAL #1   Title  Pt will demonstrate improved ease with fastening buttons as evidenced by decreasing 3 button / unbutton to 70 secs or less.    Baseline  79 secs    Time  8    Period  Weeks    Status  New     Target Date  07/05/18      OT LONG TERM GOAL #2   Title  pt will demonstrate increased RUE functional use for ADLS as evidenced by increasing RUE box/ blocks score to 30 blocks    Baseline  RUE 26 blocks, LUE 32 blocks    Time  8    Period  Weeks    Status  New      OT LONG TERM GOAL #3   Title  Pt/ wife will verbalize understanding of community resources and ways to prevent future PD related complications.     Time  8    Period  Weeks    Status  New      OT LONG TERM GOAL #4   Title  Pt / wife will verbalize understanding of ways to keep thinking skills sharp and compensatory strategies for cognitive changes associated with PD.    Time  8    Period  Weeks    Status  New            Plan - 05/22/18 1305    Clinical Impression Statement  Pt is progressing towards goals. Pt requires v.c for PWR! supine, however pt's wife demonstrates a good understanding and she is able to cue pt.    Occupational Profile and client history currently impacting functional performance  PMH: Parkinson's disease, CVA, migraines, thoracic aortic aneurysm, sleep apnea, migraines    Occupational performance deficits (Please refer to evaluation for details):  ADL's;IADL's;Education;Play;Leisure;Social Participation    Rehab Potential  Good    Current Impairments/barriers affecting progress:  cogntive deficits, abnormal posture, bradykinesia, abnormal posture    OT Frequency  2x / week    OT Duration  8 weeks    OT Treatment/Interventions  Self-care/ADL training;Therapeutic exercise;Visual/perceptual remediation/compensation;Patient/family education;Neuromuscular education;Paraffin;Moist Heat;Fluidtherapy;Energy conservation;Therapist, nutritional;Therapeutic activities;Balance training;Cognitive remediation/compensation;Passive range of motion;Manual Therapy;DME and/or AE instruction;Ultrasound;Cryotherapy    Plan  PWR! supine or PWR! seated, ADL strategies    Consulted and Agree with Plan of Care   Patient;Family member/caregiver    Family Member Consulted  wife       Patient will benefit from skilled therapeutic intervention in order to improve the following deficits and impairments:  Abnormal gait, Decreased cognition, Impaired flexibility, Decreased mobility, Decreased coordination, Decreased activity tolerance, Decreased endurance, Decreased range of motion, Decreased strength, Impaired UE functional use, Impaired perceived functional ability, Difficulty walking, Decreased safety awareness, Decreased knowledge of precautions, Decreased balance  Visit Diagnosis: Muscle weakness (generalized)  Other lack of coordination  Other symptoms and signs involving the musculoskeletal system  Other symptoms and signs involving the nervous system    Problem List Patient Active Problem List   Diagnosis Date Noted  . Parkinsonism ([place]) 07/30/2017  . 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 Agitation 11/18/2014  . Complicated migraine 15/83/0940  . Cognitive and behavioral changes 11/05/2014  . Altered mental status   . History of migraine headaches   . Acute CVA (cerebrovascular accident) ([place]) 10/27/2014  . Aphasia   . Thoracic aortic aneurysm ([place]) 11/04/2013  . Aortic insufficiency 11/04/2013  . Obstructive sleep apnea 11/04/2013    Upton Russey 05/22/2018, 1:07 PM  [facility] [address] [place], [place], [address] Phone: [phone]   Fax:  [phone]  Name: Christopher [place] MRN: [id number] Date of Birth: 19-May-1941

## 2018-05-22 NOTE — Therapy (Signed)
[facility] [address] [place], [place], [address] Phone: [phone]   Fax:  [phone]  Physical Therapy Treatment  Patient Details  Name: Christopher Atkinson MRN: [id number] Date of Birth: 02-17-1941 Referring Provider: Dr. Carles Collet    Encounter Date: 05/22/2018  PT End of Session - 05/22/18 1613    Visit Number  5    Number of Visits  17    Date for PT Re-Evaluation  07/30/18    Authorization Type  [employer]    PT Start Time  0845    PT Stop Time  0928    PT Time Calculation (min)  43 min    Equipment Utilized During Treatment  Gait belt    Activity Tolerance  Patient tolerated treatment well    Behavior During Therapy  [facility] for tasks assessed/performed       Past Medical History:  Diagnosis Date  . Aortic insufficiency   . Headache   . Obstructive sleep apnea    on CPAP  . Thoracic aortic aneurysm [facility])     Past Surgical History:  Procedure Laterality Date  . BACK SURGERY    . CATARACT EXTRACTION, BILATERAL    . CHOLECYSTECTOMY    . HERNIA REPAIR    . HERNIA REPAIR    . HERNIA REPAIR    . NOSE SURGERY      There were no vitals filed for this visit.  Subjective Assessment - 05/22/18 0848    Subjective  No changes since last visit.  No falls.  Been doing some of the exercises, but he gets tired easily (per wife).     Patient is accompained by:  Family member wife    Patient Stated Goals  Pt's goals for therapy are to be able to walk better and stand straighter.    Currently in Pain?  No/denies                       [facility] Adult PT Treatment/Exercise - 05/22/18 0908      Ambulation/Gait   Ambulation/Gait Assistance  5: Supervision;4: Min guard    Ambulation/Gait Assistance Details  tactile and vc for upright posture; pt did well with cue for "miliatary posture" "squeeze shoulder blades together,"  and counting his cadence as in [employer] (hut 2, hut 4) however difficult for pt to maintain  both    Ambulation Distance (Feet)  150 Feet standing rest break after 50 ft; then 150; 230 x2 more with     Assistive device  4-wheeled walker;None    Gait Pattern  Step-through pattern;Decreased arm swing - right;Decreased arm swing - left;Decreased step length - right;Decreased dorsiflexion - right;Shuffle;Festinating;Decreased trunk rotation;Trunk flexed;Narrow base of support;Poor foot clearance - right    Ambulation Surface  Indoor      Posture/Postural Control   Posture/Postural Control  Postural limitations    Postural Limitations  Rounded Shoulders;Forward head;Flexed trunk flexed hips and knees    Posture Comments  nearly constant cues to improve posture throughout sesion        PWR [facility]) - 05/22/18 0907    PWR! Up  20    PWR! Rock  20    Comments  Pt denied back pain iwth PWR!Up and able to achieve full upright posture; after PWR rock reported incr back pain            PT Short Term Goals - 05/01/18 2147      PT SHORT TERM GOAL #1  Title  Pt will perform HEP with wife's supervision for improved functional mobility and balance.  TARGET 05/29/18    Time  4    Period  Weeks    Status  New    Target Date  05/29/18      PT SHORT TERM GOAL #2   Title  Pt will improve 5x sit<>stand to less than or equal to 17 seconds for improved efficiency and safety with gait.    Time  4    Period  Weeks    Status  New    Target Date  05/29/18      PT SHORT TERM GOAL #3   Title  Pt will improve TUG score to less than or equal to 20 seconds for decreased fall risk.    Time  4    Period  Weeks    Status  New    Target Date  05/29/18      PT SHORT TERM GOAL #4   Title  Pt/wife will verbalize understanding of tips to reduce freezing with gait and turns.    Time  4    Period  Weeks    Status  New    Target Date  05/29/18        PT Long Term Goals - 05/01/18 2150      PT LONG TERM GOAL #1   Title  Pt/wife will verbalize understanding of fall prevention in home  environment.  TARGET    Time  8    Period  Weeks    Status  New    Target Date  06/26/18      PT LONG TERM GOAL #2   Title  Pt will improve 5x sit<>stand to less than or equal to 14 seconds for decreased fall risk.    Time  8    Period  Weeks    Status  New    Target Date  06/26/18      PT LONG TERM GOAL #3   Title  Pt will improve TUG score to less than or equal to 15 seconds ofr decreased fall risk.    Time  8    Period  Weeks    Status  New    Target Date  06/26/18      PT LONG TERM GOAL #4   Title  Pt will improve gait velocity to at least 2.62 ft/sec for improved gait efficiency and safety.    Time  8    Period  Weeks    Status  New    Target Date  06/26/18      PT LONG TERM GOAL #5   Title  Pt/wife will verbalize plans for ongoing community fitness upon d/c from PT.    Time  8    Period  Weeks    Status  New    Target Date  06/26/18            Plan - 05/22/18 1629    Clinical Impression Statement  Patient initially denied any pain, however after completing PWR! rock exercises he reported back pain. Attempted to use walking to "work it out" with pt reporting slight improvement but did not completely clear for remainder of session (which also included 2 seated rest breaks in chair with back support).  Overall, pt able to achieve nearly full upright posture with cuing, however as he begins walking he quickly loses this with forward lean, hip and knee flexion. Patient's posture is worse with use of  rollator, however he and wife describe using in community due to need for place to sit and rest. (Pt was able to demonstrate safe, appropriate technique to turn and sit on walker). Overall, pt showing signs of progress and needs continued practice and repetition to hopefully maintain improvements. Wife very attentive and updated on helpful cues for his walking and posture.     Rehab Potential  Good    PT Frequency  2x / week    PT Duration  8 weeks plus eval    PT  Treatment/Interventions  ADLs/Self Care Home Management;DME Instruction;Gait training;Stair training;Functional mobility training;Therapeutic activities;Therapeutic exercise;Balance training;Neuromuscular re-education;Patient/family education    PT Next Visit Plan   work on standing PWR exercises, posture exercises, gait with rollator    Consulted and Agree with Plan of Care  Patient;Family member/caregiver    Family Member Consulted  wife       Patient will benefit from skilled therapeutic intervention in order to improve the following deficits and impairments:  Abnormal gait, Decreased balance, Decreased mobility, Decreased safety awareness, Difficulty walking, Decreased strength, Postural dysfunction  Visit Diagnosis: Muscle weakness (generalized)  Other symptoms and signs involving the nervous system  Abnormal posture  Other abnormalities of gait and mobility     Problem List Patient Active Problem List   Diagnosis Date Noted  . Parkinsonism ([place]) 07/30/2017  . 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 Agitation 11/18/2014  . Complicated migraine 37/34/2876  . Cognitive and behavioral changes 11/05/2014  . Altered mental status   . History of migraine headaches   . Acute CVA (cerebrovascular accident) ([place]) 10/27/2014  . Aphasia   . Thoracic aortic aneurysm ([place]) 11/04/2013  . Aortic insufficiency 11/04/2013  . Obstructive sleep apnea 11/04/2013    Rexanne Mano, PT 05/22/2018, 4:35 PM  [facility] [address] [place], [place], [address] Phone: [phone]   Fax:  [phone]  Name: Mauri Tolen MRN: [id number] Date of Birth: 1941/08/18

## 2018-05-26 ENCOUNTER — Ambulatory Visit: Payer: PPO | Attending: Neurology | Admitting: Physical Therapy

## 2018-05-26 ENCOUNTER — Ambulatory Visit: Payer: PPO | Admitting: Occupational Therapy

## 2018-05-26 ENCOUNTER — Encounter: Payer: Self-pay | Admitting: Physical Therapy

## 2018-05-26 DIAGNOSIS — R293 Abnormal posture: Secondary | ICD-10-CM | POA: Diagnosis not present

## 2018-05-26 DIAGNOSIS — R278 Other lack of coordination: Secondary | ICD-10-CM

## 2018-05-26 DIAGNOSIS — R1312 Dysphagia, oropharyngeal phase: Secondary | ICD-10-CM | POA: Diagnosis not present

## 2018-05-26 DIAGNOSIS — R2681 Unsteadiness on feet: Secondary | ICD-10-CM | POA: Diagnosis not present

## 2018-05-26 DIAGNOSIS — R2689 Other abnormalities of gait and mobility: Secondary | ICD-10-CM | POA: Diagnosis not present

## 2018-05-26 DIAGNOSIS — M6281 Muscle weakness (generalized): Secondary | ICD-10-CM

## 2018-05-26 DIAGNOSIS — R41844 Frontal lobe and executive function deficit: Secondary | ICD-10-CM | POA: Insufficient documentation

## 2018-05-26 DIAGNOSIS — R29818 Other symptoms and signs involving the nervous system: Secondary | ICD-10-CM

## 2018-05-26 DIAGNOSIS — R29898 Other symptoms and signs involving the musculoskeletal system: Secondary | ICD-10-CM

## 2018-05-26 DIAGNOSIS — R471 Dysarthria and anarthria: Secondary | ICD-10-CM | POA: Diagnosis not present

## 2018-05-26 NOTE — Therapy (Signed)
[facility] [address] [place], [place], [address] Phone: [phone]   Fax:  [phone]  Occupational Therapy Treatment  Patient Details  Name: Christopher Atkinson MRN: [id number] Date of Birth: March 27, 1941 Referring Provider: Dr. Carles Collet    Encounter Date: 05/26/2018  OT End of Session - 05/26/18 1240    Visit Number  5    Number of Visits  17    Date for OT Re-Evaluation  07/05/18    Authorization Type  Medicare    Authorization Time Period  2x week x 8 weeks with 12 week cert period    OT Start Time  0852    OT Stop Time  0930    OT Time Calculation (min)  38 min    Activity Tolerance  Patient tolerated treatment well    Behavior During Therapy  [facility] for tasks assessed/performed       Past Medical History:  Diagnosis Date  . Aortic insufficiency   . Headache   . Obstructive sleep apnea    on CPAP  . Thoracic aortic aneurysm [facility])     Past Surgical History:  Procedure Laterality Date  . BACK SURGERY    . CATARACT EXTRACTION, BILATERAL    . CHOLECYSTECTOMY    . HERNIA REPAIR    . HERNIA REPAIR    . HERNIA REPAIR    . NOSE SURGERY      There were no vitals filed for this visit.  Subjective Assessment - 05/26/18 0854    Pertinent History  PMH: Parkinson's disease, CVA, migraines, thoracic aortic aneurysm, sleep apnea, migraines    Patient Stated Goals  To maintain his independence    Currently in Pain?  No/denies                           OT Education - 05/26/18 1242    Education provided  Yes    Education Details  reviewed PWR! basic 4 HEP 10 reps each, min-mod v.c, Bag exercises for simulated ADLs, practiced use of shoe horn for donning shoes    Person(s) Educated  Patient;Spouse    Methods  Explanation;Demonstration;Verbal cues;Handout    Comprehension  Verbalized understanding;Returned demonstration       OT Short Term Goals - 05/06/18 1010      OT SHORT TERM GOAL #1   Title   I with HEP    Time  4    Period  Weeks    Status  New    Target Date  06/05/18      OT SHORT TERM GOAL #2   Title  Pt/ wife will be I with adapted strategies for ADLs/IADLS    Time  4    Period  Weeks    Status  New      OT SHORT TERM GOAL #3   Title  Pt will demonstrate improved fine motor coordination for ADLS as evidenced by decreaseing bilteral 9 hole peg test score by 5 secs.    Baseline  RUE 60.28 secs, LUE 49.78 secs    Time  4    Period  Weeks    Status  New      OT SHORT TERM GOAL #4   Title  Pt will demonstrate improved ease with dressing as eveidenced by decreasing PPT#4 to 75 secs of less.    Baseline  1 min 23 secs    Time  4    Period  Weeks  Status  New      OT SHORT TERM GOAL #5   Title  Pt will demonstate ability to retrieve a lightweight object with RUE at 120 shoulder flexion.    Time  8    Period  Weeks    Status  New      Additional Short Term Goals   Additional Short Term Goals  Yes      OT SHORT TERM GOAL #6   Title  Pt will demonstrate ability to retrieve a lightweight object at 120 shoulder flexion with -15 elbow extension with LUE.    Time  8    Period  Weeks    Status  New        OT Long Term Goals - 05/06/18 1015      OT LONG TERM GOAL #1   Title  Pt will demonstrate improved ease with fastening buttons as evidenced by decreasing 3 button / unbutton to 70 secs or less.    Baseline  79 secs    Time  8    Period  Weeks    Status  New    Target Date  07/05/18      OT LONG TERM GOAL #2   Title  pt will demonstrate increased RUE functional use for ADLS as evidenced by increasing RUE box/ blocks score to 30 blocks    Baseline  RUE 26 blocks, LUE 32 blocks    Time  8    Period  Weeks    Status  New      OT LONG TERM GOAL #3   Title  Pt/ wife will verbalize understanding of community resources and ways to prevent future PD related complications.     Time  8    Period  Weeks    Status  New      OT LONG TERM GOAL #4   Title  Pt /  wife will verbalize understanding of ways to keep thinking skills sharp and compensatory strategies for cognitive changes associated with PD.    Time  8    Period  Weeks    Status  New            Plan - 05/26/18 1241    Clinical Impression Statement  Pt is progressing towards goals. He responds well to v.c for upright posture and larger amplitude movements, yet requires repetion due to cognitive deficits.    Occupational performance deficits (Please refer to evaluation for details):  ADL's;IADL's;Education;Play;Leisure;Social Participation    Rehab Potential  Good    Current Impairments/barriers affecting progress:  cogntive deficits, abnormal posture, bradykinesia, abnormal posture    OT Frequency  2x / week    OT Duration  8 weeks    OT Treatment/Interventions  Self-care/ADL training;Therapeutic exercise;Visual/perceptual remediation/compensation;Patient/family education;Neuromuscular education;Paraffin;Moist Heat;Fluidtherapy;Energy conservation;Therapist, nutritional;Therapeutic activities;Balance training;Cognitive remediation/compensation;Passive range of motion;Manual Therapy;DME and/or AE instruction;Ultrasound;Cryotherapy    Plan  add to bag exercises, continue ADL strategies don jacket, cut food    Consulted and Agree with Plan of Care  Patient;Family member/caregiver    Family Member Consulted  wife       Patient will benefit from skilled therapeutic intervention in order to improve the following deficits and impairments:  Abnormal gait, Decreased cognition, Impaired flexibility, Decreased mobility, Decreased coordination, Decreased activity tolerance, Decreased endurance, Decreased range of motion, Decreased strength, Impaired UE functional use, Impaired perceived functional ability, Difficulty walking, Decreased safety awareness, Decreased knowledge of precautions, Decreased balance  Visit Diagnosis: Muscle weakness (generalized)  Other  lack of coordination  Other  symptoms and signs involving the musculoskeletal system  Other symptoms and signs involving the nervous system    Problem List Patient Active Problem List   Diagnosis Date Noted  . Parkinsonism ([place]) 07/30/2017  . 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 Agitation 11/18/2014  . Complicated migraine 16/09/9603  . Cognitive and behavioral changes 11/05/2014  . Altered mental status   . History of migraine headaches   . Acute CVA (cerebrovascular accident) ([place]) 10/27/2014  . Aphasia   . Thoracic aortic aneurysm ([place]) 11/04/2013  . Aortic insufficiency 11/04/2013  . Obstructive sleep apnea 11/04/2013    Corrigan Kretschmer 05/26/2018, 12:44 PM Theone Murdoch, OTR/L [phone] Phone: [phone] 12:44 PM 05/26/18 [facility] [address] [place], [place], [address] Phone: [phone]   Fax:  [phone]  Name: Christopher Atkinson MRN: [id number] Date of Birth: 12-10-1941

## 2018-05-26 NOTE — Therapy (Signed)
[facility] [address] [place], [place], [address] Phone: [phone]   Fax:  [phone]  Physical Therapy Treatment  Patient Details  Name: Christopher Atkinson MRN: [id number] Date of Birth: 1941/11/28 Referring Provider: Dr. Carles Collet    Encounter Date: 05/26/2018  PT End of Session - 05/26/18 1948    Visit Number  6    Number of Visits  17    Date for PT Re-Evaluation  07/30/18    Authorization Type  [employer]    PT Start Time  0932    PT Stop Time  1014    PT Time Calculation (min)  42 min    Equipment Utilized During Treatment  Gait belt    Activity Tolerance  Patient tolerated treatment well    Behavior During Therapy  [facility] for tasks assessed/performed       Past Medical History:  Diagnosis Date  . Aortic insufficiency   . Headache   . Obstructive sleep apnea    on CPAP  . Thoracic aortic aneurysm [facility])     Past Surgical History:  Procedure Laterality Date  . BACK SURGERY    . CATARACT EXTRACTION, BILATERAL    . CHOLECYSTECTOMY    . HERNIA REPAIR    . HERNIA REPAIR    . HERNIA REPAIR    . NOSE SURGERY      There were no vitals filed for this visit.  Subjective Assessment - 05/26/18 0933    Subjective  Near fall this morning when in a hurry to leave and tried to run in den; caught himself on the wall. Wife reports pt continues to have most problems with hastening of gait when he first stands and trying to take the first step.     Patient is accompained by:  Family member wife    Pertinent History  PD, dementia, migraine, CVA, aphasia, OSA    Patient Stated Goals  Pt's goals for therapy are to be able to walk better and stand straighter.    Currently in Pain?  No/denies        Treatment: Gait training including vc/tactile cues for upright posture (squeeze shoulder blades, hands on anterior shoulder and posterior pelvis with postural correction), vc for heelstrike to attempt to increase step length and  improve foot clearance (only marginally successful), pt "counting" out loud his military call used with marching to establish a cadence ("up 2 up 4") which tends to help both his posture and foot clearance, educating in how to prevent freezing/hastening of gait with pt requiring instructional and tactile cues/assist with each time he began to freeze (happened ~8 times during session--especially with transition from carpet to tile and when turning to prepare to sit down. Patient did respond well to cues through shoulders for wt-shifting side to side during turns and to initiate gait. Multiple short walks used to practice transfer, pause, rock and intiate gait with big LEFT step (pt's preference) and then also practice approach to bed, turning FULLY, and then stand to sit with control   Due to continued tendency to walk in forward flexed posture (esp hip flexion) assessed hip extension in supine with Thomas-like test (did not hang leg over EOB due to h/o back pain) with pt able to start in hooklying, pull one knee to chest, and then extend the other leg until fully resting flat on the mat. Able to do this quite easily on RLE and LLE.   During seated rests, utilized UE reaching tasks  to encourage gentle trunk rotation with good tolerance (no incr back pain).                        PT Education - 05/26/18 1947    Education Details  Educated wife throughout session on vc and techniques to improve his safety with walking, turns    Person(s) Educated  Patient;Spouse    Methods  Explanation;Demonstration;Verbal cues    Comprehension  Verbalized understanding;Returned demonstration;Verbal cues required;Need further instruction       PT Short Term Goals - 05/01/18 2147      PT SHORT TERM GOAL #1   Title  Pt will perform HEP with wife's supervision for improved functional mobility and balance.  TARGET 05/29/18    Time  4    Period  Weeks    Status  New    Target Date  05/29/18      PT  SHORT TERM GOAL #2   Title  Pt will improve 5x sit<>stand to less than or equal to 17 seconds for improved efficiency and safety with gait.    Time  4    Period  Weeks    Status  New    Target Date  05/29/18      PT SHORT TERM GOAL #3   Title  Pt will improve TUG score to less than or equal to 20 seconds for decreased fall risk.    Time  4    Period  Weeks    Status  New    Target Date  05/29/18      PT SHORT TERM GOAL #4   Title  Pt/wife will verbalize understanding of tips to reduce freezing with gait and turns.    Time  4    Period  Weeks    Status  New    Target Date  05/29/18        PT Long Term Goals - 05/01/18 2150      PT LONG TERM GOAL #1   Title  Pt/wife will verbalize understanding of fall prevention in home environment.  TARGET    Time  8    Period  Weeks    Status  New    Target Date  06/26/18      PT LONG TERM GOAL #2   Title  Pt will improve 5x sit<>stand to less than or equal to 14 seconds for decreased fall risk.    Time  8    Period  Weeks    Status  New    Target Date  06/26/18      PT LONG TERM GOAL #3   Title  Pt will improve TUG score to less than or equal to 15 seconds ofr decreased fall risk.    Time  8    Period  Weeks    Status  New    Target Date  06/26/18      PT LONG TERM GOAL #4   Title  Pt will improve gait velocity to at least 2.62 ft/sec for improved gait efficiency and safety.    Time  8    Period  Weeks    Status  New    Target Date  06/26/18      PT LONG TERM GOAL #5   Title  Pt/wife will verbalize plans for ongoing community fitness upon d/c from PT.    Time  8    Period  Weeks    Status  New  Target Date  06/26/18            Plan - 05/26/18 1949    Clinical Impression Statement  Patient with difficulty recalling/retaining tips to improve his safety with walking, however wife is very attentive and reports she has used cues from last session with varying results ("he doesn't always want to listen to me.") With  verbal and tactile cues, pt able to significantly improve upright posture with resulting improved step length and UE swing. Patient tolerated far more activity today due to less reported back pain. Will assess STGs next session and consider how much progress he has made and whether utilizing the entire 8 weeks originally planned will be necessary.     Rehab Potential  Good    PT Frequency  2x / week    PT Duration  8 weeks plus eval    PT Treatment/Interventions  ADLs/Self Care Home Management;DME Instruction;Gait training;Stair training;Functional mobility training;Therapeutic activities;Therapeutic exercise;Balance training;Neuromuscular re-education;Patient/family education    PT Next Visit Plan  check STGs; discuss with OT if full 8 weeks seems warranted (?with more repetition will his performance improve??) standing PWR exercises make pt's back worse (maybe try 5 reps of each position? he does tolerate supine PWR! with OT well);, posture exercises,     Consulted and Agree with Plan of Care  Patient;Family member/caregiver    Family Member Consulted  wife       Patient will benefit from skilled therapeutic intervention in order to improve the following deficits and impairments:  Abnormal gait, Decreased balance, Decreased mobility, Decreased safety awareness, Difficulty walking, Decreased strength, Postural dysfunction  Visit Diagnosis: Muscle weakness (generalized)  Other symptoms and signs involving the musculoskeletal system  Other symptoms and signs involving the nervous system  Abnormal posture  Other abnormalities of gait and mobility     Problem List Patient Active Problem List   Diagnosis Date Noted  . Parkinsonism ([place]) 07/30/2017  . 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 Agitation 11/18/2014  . Complicated migraine 35/45/6256  . Cognitive and behavioral changes 11/05/2014  . Altered mental status   . History of migraine headaches   . Acute CVA  (cerebrovascular accident) ([place]) 10/27/2014  . Aphasia   . Thoracic aortic aneurysm ([place]) 11/04/2013  . Aortic insufficiency 11/04/2013  . Obstructive sleep apnea 11/04/2013    Rexanne Mano, PT 05/26/2018, 7:57 PM  [facility] [address] [place], [place], [address] Phone: [phone]   Fax:  [phone]  Name: Ashwath Lasch MRN: [id number] Date of Birth: 10/26/1941

## 2018-05-26 NOTE — Patient Instructions (Signed)
Bag Exercises:  Small trash bag or produce bag works best.  For all exercises, sit with big posture (sit up tall with head up) and use big movements. Perform the following exercises 1 times per day.   Hold end of bag in one hand. Stretch fingers out big to draw the entire bag into your palm. Repeat 5 times with each hand.  Hold bag in one hand. Stretch both arms/hands out to the side as big as you can. Then, pass bag from one hand to the other IN FRONT of you. Stretch arms back out big after each pass. Repeat 10 times.  Hold bag in one hand. Stretch both arms/hands out to the side as big as you can. Then, pass bag from one hand to the other BEHIND you. Stretch arms back out big after each pass. Repeat 10 times.  Hold bag in both hands in front of you with hands/arms shoulder length apart. Move bag behind your head. Repeat 10 times.

## 2018-05-28 ENCOUNTER — Ambulatory Visit: Payer: PPO | Admitting: Speech Pathology

## 2018-05-28 ENCOUNTER — Encounter: Payer: Self-pay | Admitting: Speech Pathology

## 2018-05-28 DIAGNOSIS — M6281 Muscle weakness (generalized): Secondary | ICD-10-CM | POA: Diagnosis not present

## 2018-05-28 DIAGNOSIS — R471 Dysarthria and anarthria: Secondary | ICD-10-CM

## 2018-05-28 DIAGNOSIS — R1312 Dysphagia, oropharyngeal phase: Secondary | ICD-10-CM

## 2018-05-28 NOTE — Patient Instructions (Addendum)
   Cognitive Activities you can do at home:   - [place] (easy level)  - [place] saw puzzles  - Spot the difference games  - Odd one out (which doesn't belong)  Loud AH for as loud and as long as you can without straining your voice  Read 3-4 paragraphs after loud AH! daily  Christopher Atkinson needs extra time to process what people are saying and extra time to complete tasks - give him the time to figure out what he needs to do or say - Family and friends will have to be educated about this.   Swallow exercises

## 2018-05-28 NOTE — Therapy (Signed)
[facility] [address] [place], [place], [address] Phone: [phone]   Fax:  [phone]  Speech Language Pathology Treatment  Patient Details  Name: Christopher Atkinson MRN: [id number] Date of Birth: [birth year] Referring Provider: Alonza Bogus, DO   Encounter Date: 05/28/2018  End of Session - 05/28/18 1032    Visit Number  4    Number of Visits  17    Date for SLP Re-Evaluation  07/17/18    Authorization Type  Pt missed 1 week of ST (week of 5/27) so I extended date for re-eval 1 week    SLP Start Time  0847    SLP Stop Time   0930    SLP Time Calculation (min)  43 min    Activity Tolerance  Patient tolerated treatment well       Past Medical History:  Diagnosis Date  . Aortic insufficiency   . Headache   . Obstructive sleep apnea    on CPAP  . Thoracic aortic aneurysm [facility])     Past Surgical History:  Procedure Laterality Date  . BACK SURGERY    . CATARACT EXTRACTION, BILATERAL    . CHOLECYSTECTOMY    . HERNIA REPAIR    . HERNIA REPAIR    . HERNIA REPAIR    . NOSE SURGERY      There were no vitals filed for this visit.  Subjective Assessment - 05/28/18 0854    Subjective  "I have to ask him to repeat himself"    Patient is accompained by:  -- spouse Lib    Currently in Pain?  No/denies            ADULT SLP TREATMENT - 05/28/18 0855      General Information   Behavior/Cognition  Alert;Cooperative;Lethargic      Treatment Provided   Treatment provided  Dysphagia;Cognitive-Linquistic      Dysphagia Treatment   Temperature Spikes Noted  No    Respiratory Status  Room air    Patient observed directly with PO's  Yes    Type of PO's observed  Thin liquids    Amount of cueing  Moderate    Other treatment/comments  Pt completed HEP for dysphagia with usual mod A and extended time. Pt did not complete [place]. Spouse reports providing cues for bolus size at home with occasional success.       Cognitive-Linquistic Treatment   Treatment focused on  Dysarthria    Skilled Treatment  Loud /a/ to recalibrate volume with min A for breath support. Pt read simple sentences with average of 72dB - trained spouse in visual cue for pt and facilitated breath support for volume. Trained spouse in allowing pt extra time to respond to her instructions and during ADL/chores due to cognitive impairments and slow processing. Simple conversational responses average 68dB with visual and verba cues for volume      Assessment / Recommendations / Plan   Plan  Continue with current plan of care      Dysphagia Recommendations   Diet recommendations  Dysphagia 3 (mechanical soft)    Liquids provided via  Cup    Medication Administration  Whole meds with liquid    Supervision  Full supervision/cueing for compensatory strategies    Compensations  Effortful swallow;Clear throat intermittently;Multiple dry swallows after each bite/sip;Small sips/bites;Slow rate      Progression Toward Goals   Progression toward goals  Progressing toward goals       SLP Education -  05/28/18 1025    Education provided  Yes    Education Details  external compensations for slow processing/bradyphrenia    Person(s) Educated  Patient;Spouse    Methods  Explanation;Demonstration;Verbal cues;Handout    Comprehension  Verbalized understanding;Returned demonstration;Verbal cues required;Need further instruction       SLP Short Term Goals - 05/28/18 1031      SLP [place] #1   Title  pt will follow swallow precautions in 80% of opportunities during session with appropriate POs    Time  2    Period  Weeks or 9 total visits, for all STGs    Status  On-going      SLP SHORT TERM GOAL #2   Title  pt will demo swallowing HEP with occaisonal min A over two sessions    Time  2    Period  Weeks    Status  On-going      SLP SHORT TERM GOAL #3   Title  pt will produce /a/ with average upper 80s over 4 sessions    Time  2     Period  Weeks    Status  On-going      SLP SHORT TERM GOAL #4   Title  pt will participate in 3 minutes simple conversation with average volume 69 dB over two sessions    Time  2    Period  Weeks    Status  On-going      SLP SHORT TERM GOAL #5   Title  pt will answer simple questions with average 70dB in two therapy sessions    Time  2    Period  Weeks    Status  On-going       SLP Long Term Goals - 05/28/18 1031      SLP LONG TERM GOAL #1   Title  pt will follow swallow precautions >/= 90% of the time with written cues    Time  6    Period  Weeks or 17 total sessions, for all LTGs    Status  On-going      SLP LONG TERM GOAL #2   Title  pt will produce loud /a/ average upper 80s over 6 sessions    Time  6    Period  Weeks    Status  On-going      SLP LONG TERM GOAL #3   Title  pt will participate in 5+ minutes simple conversation with average volume in low 70s dB with occasional nonverbal cues    Time  6    Period  Weeks    Status  On-going      SLP LONG TERM GOAL #4   Title  pt/family will tell 3 overt s/s aspiration PNA    Time  6    Period  Weeks    Status  On-going       Plan - 05/28/18 1027    Clinical Impression Statement  Spouse present, she report cueing pt for small bolus with some success. Trained her in cueing for loud /a/, and volume. Pt performed HEP for dysphagia with min to mod A, ? carryover at home - further spouse training needed. Pt also not reading after loud /a/, spouse and pt re-educated re: this. Pt continues to present with sub normal volume affecting intellgilbity and inconsistent carryover of swallow precauations. Continue skilled ST to maximize intellgilbility and safety of swallow    Speech Therapy Frequency  2x / week  Duration  -- 8 weeks or 17 visits    Treatment/Interventions  Aspiration precaution training;Pharyngeal strengthening exercises;Diet toleration management by SLP;Trials of upgraded texture/liquids;Internal/external  aids;Multimodal communcation approach;Patient/family education;Compensatory strategies;SLP instruction and feedback;Cueing hierarchy;Environmental controls;Functional tasks    Potential to Achieve Goals  Fair    Potential Considerations  Severity of impairments;Ability to learn/carryover information    SLP Home Exercise Plan  HEP for dysphagia    Consulted and Agree with Plan of Care  Patient;Family member/caregiver    Family Member Consulted  spouse Lib       Patient will benefit from skilled therapeutic intervention in order to improve the following deficits and impairments:   Dysphagia, oropharyngeal phase  Dysarthria and anarthria    Problem List Patient Active Problem List   Diagnosis Date Noted  . Parkinsonism ([place]) 07/30/2017  . 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 Agitation 11/18/2014  . Complicated migraine 48/18/5631  . Cognitive and behavioral changes 11/05/2014  . Altered mental status   . History of migraine headaches   . Acute CVA (cerebrovascular accident) ([place]) 10/27/2014  . Aphasia   . Thoracic aortic aneurysm ([place]) 11/04/2013  . Aortic insufficiency 11/04/2013  . Obstructive sleep apnea 11/04/2013    Devetta Hagenow, Annye Rusk MS, CCC-SLP 05/28/2018, 10:34 AM  [facility] [address] [place], [place], [address] Phone: [phone]   Fax:  [phone]   Name: Christopher Atkinson MRN: [id number] Date of Birth: 04-28-1941

## 2018-05-29 ENCOUNTER — Ambulatory Visit: Payer: Self-pay | Admitting: Physical Therapy

## 2018-05-29 ENCOUNTER — Ambulatory Visit: Payer: PPO

## 2018-05-29 ENCOUNTER — Ambulatory Visit: Payer: PPO | Admitting: Occupational Therapy

## 2018-05-29 DIAGNOSIS — R293 Abnormal posture: Secondary | ICD-10-CM

## 2018-05-29 DIAGNOSIS — M6281 Muscle weakness (generalized): Secondary | ICD-10-CM

## 2018-05-29 DIAGNOSIS — R29818 Other symptoms and signs involving the nervous system: Secondary | ICD-10-CM

## 2018-05-29 DIAGNOSIS — R1312 Dysphagia, oropharyngeal phase: Secondary | ICD-10-CM

## 2018-05-29 DIAGNOSIS — R29898 Other symptoms and signs involving the musculoskeletal system: Secondary | ICD-10-CM

## 2018-05-29 DIAGNOSIS — R471 Dysarthria and anarthria: Secondary | ICD-10-CM

## 2018-05-29 DIAGNOSIS — R278 Other lack of coordination: Secondary | ICD-10-CM

## 2018-05-29 NOTE — Therapy (Signed)
[facility] [address] [place], [place], [address] Phone: [phone]   Fax:  [phone]  Occupational Therapy Treatment  Patient Details  Name: Christopher Atkinson MRN: [id number] Date of Birth: 07/07/1941 Referring Provider: Dr. Carles Collet    Encounter Date: 05/29/2018  OT End of Session - 05/29/18 1144    Visit Number  6    Number of Visits  17    Date for OT Re-Evaluation  07/05/18    Authorization Type  Medicare    Authorization Time Period  2x week x 8 weeks with 12 week cert period    OT Start Time  1104    OT Stop Time  1145    OT Time Calculation (min)  41 min       Past Medical History:  Diagnosis Date  . Aortic insufficiency   . Headache   . Obstructive sleep apnea    on CPAP  . Thoracic aortic aneurysm [facility])     Past Surgical History:  Procedure Laterality Date  . BACK SURGERY    . CATARACT EXTRACTION, BILATERAL    . CHOLECYSTECTOMY    . HERNIA REPAIR    . HERNIA REPAIR    . HERNIA REPAIR    . NOSE SURGERY      There were no vitals filed for this visit.  Subjective Assessment - 05/29/18 1106    Subjective   Denies pain    Pertinent History  PMH: Parkinson's disease, CVA, migraines, thoracic aortic aneurysm, sleep apnea, migraines    Patient Stated Goals  To maintain his independence    Currently in Pain?  No/denies                      PWR [facility]) - 05/29/18 1248    PWR! exercises  Moves in sitting          OT Education - 05/29/18 1250    Education provided  Yes    Education Details  reviewed bag exercises and added simulated LB dressing exercise, 10 reps each, min/ mod v.c for big movments, cutting food with adapted strategy, cintinuous"l" for ahdnwriting and foam grip on pen    Person(s) Educated  Patient;Spouse    Methods  Explanation;Demonstration;Verbal cues;Handout    Comprehension  Verbalized understanding;Returned demonstration       OT Short Term Goals - 05/06/18  1010      OT SHORT TERM GOAL #1   Title  I with HEP    Time  4    Period  Weeks    Status  New    Target Date  06/05/18      OT SHORT TERM GOAL #2   Title  Pt/ wife will be I with adapted strategies for ADLs/IADLS    Time  4    Period  Weeks    Status  New      OT SHORT TERM GOAL #3   Title  Pt will demonstrate improved fine motor coordination for ADLS as evidenced by decreaseing bilteral 9 hole peg test score by 5 secs.    Baseline  RUE 60.28 secs, LUE 49.78 secs    Time  4    Period  Weeks    Status  New      OT SHORT TERM GOAL #4   Title  Pt will demonstrate improved ease with dressing as eveidenced by decreasing PPT#4 to 75 secs of less.    Baseline  1 min 23 secs  Time  4    Period  Weeks    Status  New      OT SHORT TERM GOAL #5   Title  Pt will demonstate ability to retrieve a lightweight object with RUE at 120 shoulder flexion.    Time  8    Period  Weeks    Status  New      Additional Short Term Goals   Additional Short Term Goals  Yes      OT SHORT TERM GOAL #6   Title  Pt will demonstrate ability to retrieve a lightweight object at 120 shoulder flexion with -15 elbow extension with LUE.    Time  8    Period  Weeks    Status  New        OT Long Term Goals - 05/06/18 1015      OT LONG TERM GOAL #1   Title  Pt will demonstrate improved ease with fastening buttons as evidenced by decreasing 3 button / unbutton to 70 secs or less.    Baseline  79 secs    Time  8    Period  Weeks    Status  New    Target Date  07/05/18      OT LONG TERM GOAL #2   Title  pt will demonstrate increased RUE functional use for ADLS as evidenced by increasing RUE box/ blocks score to 30 blocks    Baseline  RUE 26 blocks, LUE 32 blocks    Time  8    Period  Weeks    Status  New      OT LONG TERM GOAL #3   Title  Pt/ wife will verbalize understanding of community resources and ways to prevent future PD related complications.     Time  8    Period  Weeks    Status   New      OT LONG TERM GOAL #4   Title  Pt / wife will verbalize understanding of ways to keep thinking skills sharp and compensatory strategies for cognitive changes associated with PD.    Time  8    Period  Weeks    Status  New            Plan - 05/29/18 1247    Clinical Impression Statement  Pt is progressing towards goals. He demonstrates increased ease with cutting food using big movement strategies.    Rehab Potential  Good    Current Impairments/barriers affecting progress:  cogntive deficits, abnormal posture, bradykinesia, abnormal posture    OT Frequency  2x / week    OT Duration  8 weeks    OT Treatment/Interventions  Self-care/ADL training;Therapeutic exercise;Visual/perceptual remediation/compensation;Patient/family education;Neuromuscular education;Paraffin;Moist Heat;Fluidtherapy;Energy conservation;Therapist, nutritional;Therapeutic activities;Balance training;Cognitive remediation/compensation;Passive range of motion;Manual Therapy;DME and/or AE instruction;Ultrasound;Cryotherapy    Plan  donning/ doffing jacket, fasten buttons, dynamic step and reach    Consulted and Agree with Plan of Care  Patient;Family member/caregiver    Family Member Consulted  wife       Patient will benefit from skilled therapeutic intervention in order to improve the following deficits and impairments:  Abnormal gait, Decreased cognition, Impaired flexibility, Decreased mobility, Decreased coordination, Decreased activity tolerance, Decreased endurance, Decreased range of motion, Decreased strength, Impaired UE functional use, Impaired perceived functional ability, Difficulty walking, Decreased safety awareness, Decreased knowledge of precautions, Decreased balance  Visit Diagnosis: Other lack of coordination  Muscle weakness (generalized)  Other symptoms and signs involving the musculoskeletal system  Other symptoms and signs involving the nervous system  Abnormal  posture    Problem List Patient Active Problem List   Diagnosis Date Noted  . 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 Agitation 11/18/2014  . Complicated migraine 10/22/5944  . Cognitive and behavioral changes 11/05/2014  . Altered mental status   . History of migraine headaches   . Acute CVA (cerebrovascular accident) ([place]) 10/27/2014  . Aphasia   . Thoracic aortic aneurysm ([place]) 11/04/2013  . Aortic insufficiency 11/04/2013  . Obstructive sleep apnea 11/04/2013    RINE,KATHRYN 05/29/2018, 12:52 PM  [facility] [address] [place], [place], [address] Phone: [phone]   Fax:  [phone]  Name: Christopher Atkinson MRN: [id number] Date of Birth: July 15, 1941

## 2018-05-29 NOTE — Patient Instructions (Signed)
Bag Exercises:  Small trash bag or produce bag works best.  For all exercises, sit with big posture (sit up tall with head up) and use big movements. Perform the following exercises 1 times per day.   Hold end of bag in one hand. Stretch fingers out big to draw the entire bag into your palm. Repeat 5 times with each hand.  Hold bag in one hand. Stretch both arms/hands out to the side as big as you can. Then, pass bag from one hand to the other IN FRONT of you. Stretch arms back out big after each pass. Repeat 10 times.  Hold bag in one hand. Stretch both arms/hands out to the side as big as you can. Then, pass bag from one hand to the other BEHIND you. Stretch arms back out big after each pass. Repeat 10 times.  Hold bag in both hands in front of you with hands/arms shoulder length apart. Move bag behind your head. Repeat 10 times.  Hold bag in both hands in front of you with hands/arms shoulder length apart. Lift leg and move bag completely under each foot and back. Repeat 10 times on each side. 

## 2018-05-29 NOTE — Patient Instructions (Signed)
Srite down 10 sentences you say everyday and bring them to therapy next time.

## 2018-05-29 NOTE — Therapy (Signed)
Christopher Atkinson [address] [place], [place], [address] Phone: [phone]   Fax:  [phone]  Physical Therapy Treatment  Patient Details  Name: Christopher Atkinson MRN: [id number] Date of Birth: 10-09-1941 Referring Provider: Dr. Carles Collet    Encounter Date: 05/29/2018  PT End of Session - 05/29/18 1423    Visit Number  7    Number of Visits  17    Date for PT Re-Evaluation  07/30/18    Authorization Type  [employer]    PT Start Time  1234 pt with speech    PT Stop Time  1313    PT Time Calculation (min)  39 min    Equipment Utilized During Treatment  -- min guard to min A prn    Activity Tolerance  Patient tolerated treatment well;No increased pain    Behavior During Therapy  WFL for tasks assessed/performed       Past Medical History:  Diagnosis Date  . Aortic insufficiency   . Headache   . Obstructive sleep apnea    on CPAP  . Thoracic aortic aneurysm [facility])     Past Surgical History:  Procedure Laterality Date  . BACK SURGERY    . CATARACT EXTRACTION, BILATERAL    . CHOLECYSTECTOMY    . HERNIA REPAIR    . HERNIA REPAIR    . HERNIA REPAIR    . NOSE SURGERY      There were no vitals filed for this visit.  Subjective Assessment - 05/29/18 1239    Subjective  Pt denied falls since last visit. Pt reported he's a little tired today. Pt and wife were able to verbalize techniques to reduce freezing episodes.     Patient is accompained by:  Family member wife-[name]    Pertinent History  PD, dementia, migraine, CVA, aphasia, OSA    Patient Stated Goals  Pt's goals for therapy are to be able to walk better and stand straighter.    Currently in Pain?  No/denies                       [facility] Adult PT Treatment/Exercise - 05/29/18 1240      Transfers   Transfers  Sit to Stand;Stand to Sit    Five time sit to stand comments   16.25 sec. no UE support (arms crossed) cues to stand fully upright       Standardized Balance Assessment   Standardized Balance Assessment  Timed Up and Go Test      Timed Up and Go Test   TUG  Normal TUG    Normal TUG (seconds)  10.69 no AD (flexed posture and mig guard), 12.5 sec. (safer)       Standing x5 reps and seated x10 reps. Pt demonstrated improved carry over with PT explaining exercise, demostrating, and then guarding pt while wife performed (mirrored) exercise.  PT provided manual facilitation for weight shifting and trunk rotation. PWR [facility]) - 05/29/18 1250    PWR! Up  x5     PWR! Rock  x5     PWR! Twist  x5    PWR Step  x5    Comments  Cues and demo for technique.    PWR! Up  x10     PWR! Rock  x10     PWR! Twist  x10     PWR! Step  x10    Comments  Cues from PT and wife for techinque.  PT Education - 05/29/18 1422    Education provided  Yes    Education Details  PT discussed goal progress and provided cues and demo for PWR! HEP technique.    Person(s) Educated  Patient;Spouse    Methods  Explanation;Demonstration;Tactile cues;Verbal cues;Handout    Comprehension  Verbalized understanding;Returned demonstration;Need further instruction       PT Short Term Goals - 05/29/18 1427      PT SHORT TERM GOAL #1   Title  Pt will perform HEP with wife's supervision for improved functional mobility and balance.  TARGET 05/29/18    Time  4    Period  Weeks    Status  Partially Met      PT SHORT TERM GOAL #2   Title  Pt will improve 5x sit<>stand to less than or equal to 17 seconds for improved efficiency and safety with gait.    Time  4    Period  Weeks    Status  Achieved      PT SHORT TERM GOAL #3   Title  Pt will improve TUG score to less than or equal to 20 seconds for decreased fall risk.    Time  4    Period  Weeks    Status  Achieved      PT SHORT TERM GOAL #4   Title  Pt/wife will verbalize understanding of tips to reduce freezing with gait and turns.    Time  4    Period  Weeks    Status  Achieved         PT Long Term Goals - 05/29/18 1427      PT LONG TERM GOAL #1   Title  Pt/wife will verbalize understanding of fall prevention in home environment.  TARGET    Time  8    Period  Weeks    Status  New      PT LONG TERM GOAL #2   Title  Pt will improve 5x sit<>stand to less than or equal to 14 seconds for decreased fall risk.    Time  8    Period  Weeks    Status  New      PT LONG TERM GOAL #3   Title  Pt will improve TUG score to less than or equal to 15 seconds ofr decreased fall risk.    Time  8    Period  Weeks    Status  Achieved      PT LONG TERM GOAL #4   Title  Pt will improve gait velocity to at least 2.62 ft/sec for improved gait efficiency and safety.    Time  8    Period  Weeks    Status  New      PT LONG TERM GOAL #5   Title  Pt/wife will verbalize plans for ongoing community fitness upon d/c from PT.    Time  8    Period  Weeks    Status  New            Plan - 05/29/18 1424    Clinical Impression Statement  Pt demonstrated progress as he met STGs 2, 3, 4 and LTG 3. Pt partially met STG 1, as both he and his wife required demo and cues for technique during HEP. Pt performed TUG twice, as pt performed first TUG trial with flexed posture and required min A to maintain balance, pt was much safer and demostrated improved upright posture during second attempt. Pt  tolerated 5 reps of standing PWR! exercises well, as he denied incr. in back pain. Pt would continue to benefit from skilled PT to improve safety during functional mobility.     Rehab Potential  Good    PT Frequency  2x / week    PT Duration  8 weeks plus eval    PT Treatment/Interventions  ADLs/Self Care Home Management;DME Instruction;Gait training;Stair training;Functional mobility training;Therapeutic activities;Therapeutic exercise;Balance training;Neuromuscular re-education;Patient/family education    PT Next Visit Plan   discuss with OT if full 8 weeks seems warranted (PT spoke with Kathryn-OT,  and she feels pt would continue to benefit from full 8 weeks), standing PWR exercises make pt's back worse (maybe try 5 reps of each position? he does tolerate supine PWR! with OT well);, posture exercises,     Consulted and Agree with Plan of Care  Patient;Family member/caregiver    Family Member Consulted  wife       Patient will benefit from skilled therapeutic intervention in order to improve the following deficits and impairments:  Abnormal gait, Decreased balance, Decreased mobility, Decreased safety awareness, Difficulty walking, Decreased strength, Postural dysfunction  Visit Diagnosis: Other symptoms and signs involving the nervous system  Muscle weakness (generalized)  Other symptoms and signs involving the musculoskeletal system  Abnormal posture     Problem List Patient Active Problem List   Diagnosis Date Noted  . Parkinsonism ([place]) 07/30/2017  . 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 Agitation 11/18/2014  . Complicated migraine 45/36/4680  . Cognitive and behavioral changes 11/05/2014  . Altered mental status   . History of migraine headaches   . Acute CVA (cerebrovascular accident) ([place]) 10/27/2014  . Aphasia   . Thoracic aortic aneurysm ([place]) 11/04/2013  . Aortic insufficiency 11/04/2013  . Obstructive sleep apnea 11/04/2013    Christopher Atkinson L 05/29/2018, 2:28 PM  Geoffry Paradise, PT,DPT 05/29/18 2:29 PM Phone: [phone] Fax: [facility] [address] [place], [place], [address] Phone: [phone]   Fax:  [phone]  Name: Christopher Atkinson MRN: [id number] Date of Birth: October 04, 1941

## 2018-05-29 NOTE — Therapy (Signed)
[facility] [address] [place], [place], [address] Phone: [phone]   Fax:  [phone]  Speech Language Pathology Treatment  Patient Details  Name: Christopher Atkinson MRN: [id number] Date of Birth: 07/17/1941 Referring Provider: Alonza Bogus, DO   Encounter Date: 05/29/2018  End of Session - 05/29/18 1514    Visit Number  5    Number of Visits  17    Date for SLP Re-Evaluation  07/17/18    SLP Start Time  1149    SLP Stop Time   1230    SLP Time Calculation (min)  41 min    Activity Tolerance  Patient tolerated treatment well       Past Medical History:  Diagnosis Date  . Aortic insufficiency   . Headache   . Obstructive sleep apnea    on CPAP  . Thoracic aortic aneurysm [facility])     Past Surgical History:  Procedure Laterality Date  . BACK SURGERY    . CATARACT EXTRACTION, BILATERAL    . CHOLECYSTECTOMY    . HERNIA REPAIR    . HERNIA REPAIR    . HERNIA REPAIR    . NOSE SURGERY      There were no vitals filed for this visit.  Subjective Assessment - 05/29/18 1149    Subjective  "Speak up!" - with wife's cue (pt, re: what was one thing SLP told her yesterday)    Currently in Pain?  No/denies            ADULT SLP TREATMENT - 05/29/18 1151      General Information   Behavior/Cognition  Alert;Cooperative;Lethargic      Treatment Provided   Treatment provided  Cognitive-Linquistic      Cognitive-Linquistic Treatment   Treatment focused on  Dysarthria    Skilled Treatment  Loud /a/ was done today to recalibrate volume, average mid 80s dB, with min A for open mouth and loudness. SLP reminded spouse that pt will likely need usual cues fading to rare/occasional cues for loud /a/. He read converastional sentences wit haverage 72dB and rare nonverbal cues for volume. With simple opposites pt req'd extra time and generated responses average 73dB. SLP reminded spouse to allow pt extra time to respond to her  instructions- she stated that informatoin provided yesterday by SLP was very helpful. In simple phrase responses cognitive load slowed pt's response time considerably, however pt generated average mid to upper 60s dB with occasional min-mod fading to rare min A and some pt self correction for volume in the last 1/3 of the task. SLP reiterated to pt/wife that pt must practice loud /a/ x5 reps BID, adn practice homework provided. Additinoally pt and wife are to come back with 10 every day sentences that pt says, to practice whenever he does his loud /a/ practice.       Assessment / Recommendations / Plan   Plan  Continue with current plan of care      Dysphagia Recommendations   Diet recommendations  Dysphagia 3 (mechanical soft)    Liquids provided via  Cup    Medication Administration  Whole meds with liquid    Supervision  Full supervision/cueing for compensatory strategies    Compensations  Effortful swallow;Clear throat intermittently;Multiple dry swallows after each bite/sip;Small sips/bites;Slow rate      Progression Toward Goals   Progression toward goals  Progressing toward goals       SLP Education - 05/29/18 1501    Education provided  Yes    Education Details  pt likely need some sort of ongoing cueing for loudness     Person(s) Educated  Patient;Spouse    Methods  Explanation    Comprehension  Verbalized understanding       SLP Short Term Goals - 05/29/18 1516      SLP SHORT TERM GOAL #1   Title  pt will follow swallow precautions in 80% of opportunities during session with appropriate POs    Time  2    Period  Weeks or 9 total visits, for all STGs    Status  On-going      SLP SHORT TERM GOAL #2   Title  pt will demo swallowing HEP with occaisonal min A over two sessions    Time  2    Period  Weeks    Status  On-going      SLP SHORT TERM GOAL #3   Title  pt will produce /a/ with average upper 80s over 4 sessions    Baseline  --    Time  2    Period  Weeks     Status  On-going      SLP SHORT TERM GOAL #4   Title  pt will participate in 3 minutes simple conversation with average volume 69 dB over two sessions    Time  2    Period  Weeks    Status  On-going      SLP SHORT TERM GOAL #5   Title  pt will answer simple questions with average 70dB in two therapy sessions    Time  2    Period  Weeks    Status  On-going       SLP Long Term Goals - 05/29/18 1516      SLP LONG TERM GOAL #1   Title  pt will follow swallow precautions >/= 90% of the time with written cues    Time  6    Period  Weeks or 17 total sessions, for all LTGs    Status  On-going      SLP LONG TERM GOAL #2   Title  pt will produce loud /a/ average upper 80s over 6 sessions    Baseline  --    Time  6    Period  Weeks    Status  On-going      SLP LONG TERM GOAL #3   Title  pt will participate in 5+ minutes simple conversation with average volume in low 70s dB with occasional nonverbal cues    Time  6    Period  Weeks    Status  On-going      SLP LONG TERM GOAL #4   Title  pt/family will tell 3 overt s/s aspiration PNA    Time  6    Period  Weeks    Status  On-going       Plan - 05/29/18 1514    Clinical Impression Statement  Spouse present, trained her in cueing for loud /a/, and fact that pt will likely need cues for speech volume. See "skilled treatment" for more details. Pt continues to present with sub normal volume affecting intellgilbity and inconsistent carryover of swallow precauations. Continue skilled ST to maximize intellgilbility and safety of swallow    Speech Therapy Frequency  2x / week    Duration  -- 8 weeks or 17 visits    Treatment/Interventions  Aspiration precaution training;Pharyngeal strengthening exercises;Diet toleration management by SLP;Trials of  upgraded texture/liquids;Internal/external aids;Multimodal communcation approach;Patient/family education;Compensatory strategies;SLP instruction and feedback;Cueing hierarchy;Environmental  controls;Functional tasks    Potential to Achieve Goals  Fair    Potential Considerations  Severity of impairments;Ability to learn/carryover information    SLP Home Exercise Plan  HEP for dysphagia    Consulted and Agree with Plan of Care  Patient;Family member/caregiver    Family Member Consulted  spouse Lib       Patient will benefit from skilled therapeutic intervention in order to improve the following deficits and impairments:   Dysarthria and anarthria  Dysphagia, oropharyngeal phase    Problem List Patient Active Problem List   Diagnosis Date Noted  . Parkinsonism ([place]) 07/30/2017  . 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 Agitation 11/18/2014  . Complicated migraine 17/51/0258  . Cognitive and behavioral changes 11/05/2014  . Altered mental status   . History of migraine headaches   . Acute CVA (cerebrovascular accident) ([place]) 10/27/2014  . Aphasia   . Thoracic aortic aneurysm ([place]) 11/04/2013  . Aortic insufficiency 11/04/2013  . Obstructive sleep apnea 11/04/2013    [facility] ,[place], CCC-SLP  05/29/2018, 3:18 PM  [facility] [address] [place], [place], [address] Phone: [phone]   Fax:  [phone]   Name: Lavonte Palos MRN: [id number] Date of Birth: [birth year]

## 2018-06-01 ENCOUNTER — Encounter: Payer: Self-pay | Admitting: *Deleted

## 2018-06-01 ENCOUNTER — Encounter: Payer: Self-pay | Admitting: Physical Therapy

## 2018-06-01 ENCOUNTER — Ambulatory Visit: Payer: PPO | Admitting: *Deleted

## 2018-06-01 ENCOUNTER — Ambulatory Visit: Payer: PPO | Admitting: Neurology

## 2018-06-01 ENCOUNTER — Ambulatory Visit: Payer: PPO | Admitting: Physical Therapy

## 2018-06-01 DIAGNOSIS — R29818 Other symptoms and signs involving the nervous system: Secondary | ICD-10-CM

## 2018-06-01 DIAGNOSIS — R293 Abnormal posture: Secondary | ICD-10-CM

## 2018-06-01 DIAGNOSIS — M6281 Muscle weakness (generalized): Secondary | ICD-10-CM

## 2018-06-01 DIAGNOSIS — R278 Other lack of coordination: Secondary | ICD-10-CM

## 2018-06-01 DIAGNOSIS — R29898 Other symptoms and signs involving the musculoskeletal system: Secondary | ICD-10-CM

## 2018-06-01 NOTE — Therapy (Signed)
[facility] [address] [place], [place], [address] Phone: [phone]   Fax:  [phone]  Occupational Therapy Treatment  Patient Details  Name: Christopher Atkinson MRN: [id number] Date of Birth: Jun 26, 1941 Referring Provider: Dr. Carles Collet    Encounter Date: 06/01/2018  OT End of Session - 06/01/18 1254    Visit Number  7    Number of Visits  17    Date for OT Re-Evaluation  07/05/18    Authorization Type  Medicare    Authorization Time Period  2x week x 8 weeks with 12 week cert period    OT Start Time  0932    OT Stop Time  1015    OT Time Calculation (min)  43 min    Activity Tolerance  Patient tolerated treatment well    Behavior During Therapy  [facility] for tasks assessed/performed       Past Medical History:  Diagnosis Date  . Aortic insufficiency   . Headache   . Obstructive sleep apnea    on CPAP  . Thoracic aortic aneurysm [facility])     Past Surgical History:  Procedure Laterality Date  . BACK SURGERY    . CATARACT EXTRACTION, BILATERAL    . CHOLECYSTECTOMY    . HERNIA REPAIR    . HERNIA REPAIR    . HERNIA REPAIR    . NOSE SURGERY      There were no vitals filed for this visit.  Subjective Assessment - 06/01/18 0937    Subjective   Denies pain, reports that he and his wife went to a family reunion this past weekend.    Pertinent History  PMH: Parkinson's disease, CVA, migraines, thoracic aortic aneurysm, sleep apnea, migraines    Patient Stated Goals  To maintain his independence    Currently in Pain?  No/denies    Multiple Pain Sites  No        OT Treatments/Exercises (OP) - 06/01/18 0001      ADLs   ADL Comments  Verbal discussion of ADL's - both pt and wife report that he has been donning dress shirts independently buttoning buttons w/ increased time. Pt spouse reports increased independence at times with daily dressing tasks. Verbal review of use of big movements during ADL's      Exercises   Exercises  Hand;Shoulder      Hand Exercises   Other Hand Exercises  Open and close hands - exaggerated movements for open/close followed by finger extension on table top. Pt benefits from vc's for big movements during activity noted.      Functional Reaching Activities   High Level  Functional reach using exaggerated opening of hand followed by reach to place and remove graded clothes pins at mid and high level. x2 rest breaks between placing and removing  All colors (yellow, red, green, blue, black)      Fine Motor Coordination (Hand/Wrist)   Fine Motor Coordination  -- Lg grip/graded clothes pins, exhagerrated reach/place    In Hand Manipulation Training  Tossing ball;In hand manipuation training; rotating ball in hand and then changing direction as well as rotating 2 balls           OT Education - 06/01/18 1253    Education provided  Yes    Education Details  Cont HEP for [facility], dressing techniques using big movements    Person(s) Educated  Patient;Spouse    Methods  Explanation;Demonstration;Verbal cues    Comprehension  Verbalized understanding;Returned demonstration  OT Short Term Goals - 05/06/18 1010      OT SHORT TERM GOAL #1   Title  I with HEP    Time  4    Period  Weeks    Status  New    Target Date  06/05/18      OT SHORT TERM GOAL #2   Title  Pt/ wife will be I with adapted strategies for ADLs/IADLS    Time  4    Period  Weeks    Status  New      OT SHORT TERM GOAL #3   Title  Pt will demonstrate improved fine motor coordination for ADLS as evidenced by decreaseing bilteral 9 hole peg test score by 5 secs.    Baseline  RUE 60.28 secs, LUE 49.78 secs    Time  4    Period  Weeks    Status  New      OT SHORT TERM GOAL #4   Title  Pt will demonstrate improved ease with dressing as eveidenced by decreasing PPT#4 to 75 secs of less.    Baseline  1 min 23 secs    Time  4    Period  Weeks    Status  New      OT SHORT TERM GOAL #5   Title  Pt will  demonstate ability to retrieve a lightweight object with RUE at 120 shoulder flexion.    Time  8    Period  Weeks    Status  New      Additional Short Term Goals   Additional Short Term Goals  Yes      OT SHORT TERM GOAL #6   Title  Pt will demonstrate ability to retrieve a lightweight object at 120 shoulder flexion with -15 elbow extension with LUE.    Time  8    Period  Weeks    Status  New        OT Long Term Goals - 05/06/18 1015      OT LONG TERM GOAL #1   Title  Pt will demonstrate improved ease with fastening buttons as evidenced by decreasing 3 button / unbutton to 70 secs or less.    Baseline  79 secs    Time  8    Period  Weeks    Status  New    Target Date  07/05/18      OT LONG TERM GOAL #2   Title  pt will demonstrate increased RUE functional use for ADLS as evidenced by increasing RUE box/ blocks score to 30 blocks    Baseline  RUE 26 blocks, LUE 32 blocks    Time  8    Period  Weeks    Status  New      OT LONG TERM GOAL #3   Title  Pt/ wife will verbalize understanding of community resources and ways to prevent future PD related complications.     Time  8    Period  Weeks    Status  New      OT LONG TERM GOAL #4   Title  Pt / wife will verbalize understanding of ways to keep thinking skills sharp and compensatory strategies for cognitive changes associated with PD.    Time  8    Period  Weeks    Status  New            Plan - 06/01/18 1257    Clinical Impression Statement  Pt  and spouse report that pt has been independently donning and buttoning shirts with increased ease. He appears to benefit from vc's for big movements with functional reach and activity.    Occupational Profile and client history currently impacting functional performance  PMH: Parkinson's disease, CVA, migraines, thoracic aortic aneurysm, sleep apnea, migraines    Occupational performance deficits (Please refer to evaluation for details):   ADL's;IADL's;Education;Play;Leisure;Social Participation    Rehab Potential  Good    Current Impairments/barriers affecting progress:  cogntive deficits, abnormal posture, bradykinesia, abnormal posture    OT Frequency  2x / week    OT Duration  8 weeks    OT Treatment/Interventions  Self-care/ADL training;Therapeutic exercise;Visual/perceptual remediation/compensation;Patient/family education;Neuromuscular education;Paraffin;Moist Heat;Fluidtherapy;Energy conservation;Therapist, nutritional;Therapeutic activities;Balance training;Cognitive remediation/compensation;Passive range of motion;Manual Therapy;DME and/or AE instruction;Ultrasound;Cryotherapy    Plan  Donning/ doffing jacket, fasten buttons, dynamic step and reach.    Clinical Decision Making  Several treatment options, min-mod task modification necessary    Consulted and Agree with Plan of Care  Patient;Family member/caregiver    Family Member Consulted  wife       Patient will benefit from skilled therapeutic intervention in order to improve the following deficits and impairments:  Abnormal gait, Decreased cognition, Impaired flexibility, Decreased mobility, Decreased coordination, Decreased activity tolerance, Decreased endurance, Decreased range of motion, Decreased strength, Impaired UE functional use, Impaired perceived functional ability, Difficulty walking, Decreased safety awareness, Decreased knowledge of precautions, Decreased balance  Visit Diagnosis: Muscle weakness (generalized)  Other symptoms and signs involving the nervous system  Other symptoms and signs involving the musculoskeletal system  Other lack of coordination    Problem List Patient Active Problem List   Diagnosis Date Noted  . 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 Agitation 11/18/2014  . Complicated migraine 67/20/9470  . Cognitive and behavioral changes 11/05/2014  . Altered mental status   .  History of migraine headaches   . Acute CVA (cerebrovascular accident) ([place]) 10/27/2014  . Aphasia   . Thoracic aortic aneurysm ([place]) 11/04/2013  . Aortic insufficiency 11/04/2013  . Obstructive sleep apnea 11/04/2013    Percell Miller Ardath Sax, OTR/L 06/01/2018, 1:05 PM  [facility] [address] [place], [place], [address] Phone: [phone]   Fax:  [phone]  Name: Christopher Atkinson MRN: [id number] Date of Birth: [birth year]

## 2018-06-01 NOTE — Therapy (Signed)
[facility] [address] [place], [place], [address] Phone: [phone]   Fax:  [phone]  Physical Therapy Treatment  Patient Details  Name: Christopher Atkinson MRN: [id number] Date of Birth: 02-Nov-1941 Referring Provider: Dr. Carles Collet    Encounter Date: 06/01/2018  PT End of Session - 06/01/18 1141    Visit Number  8    Number of Visits  17    Date for PT Re-Evaluation  07/30/18    Authorization Type  [employer]    PT Start Time  0847    PT Stop Time  0929    PT Time Calculation (min)  42 min    Equipment Utilized During Treatment  -- min guard to min A prn    Activity Tolerance  Patient tolerated treatment well;No increased pain    Behavior During Therapy  WFL for tasks assessed/performed       Past Medical History:  Diagnosis Date  . Aortic insufficiency   . Headache   . Obstructive sleep apnea    on CPAP  . Thoracic aortic aneurysm [facility])     Past Surgical History:  Procedure Laterality Date  . BACK SURGERY    . CATARACT EXTRACTION, BILATERAL    . CHOLECYSTECTOMY    . HERNIA REPAIR    . HERNIA REPAIR    . HERNIA REPAIR    . NOSE SURGERY      There were no vitals filed for this visit.  Subjective Assessment - 06/01/18 0850    Subjective  Pt denied any falls since last visit.  Went to family reunion over the weekend without problems.    Patient is accompained by:  Family member wife-Christopher Atkinson    Pertinent History  PD, dementia, migraine, CVA, aphasia, OSA    Patient Stated Goals  Pt's goals for therapy are to be able to walk better and stand straighter.    Currently in Pain?  No/denies                       [facility] Adult PT Treatment/Exercise - 06/01/18 0001      Posture/Postural Control   Posture/Postural Control  Postural limitations    Postural Limitations  Rounded Shoulders;Forward head;Flexed trunk flexed knees    Posture Comments  Requires frequent verbal cues for return to upright  posture throughout session      Knee/Hip Exercises: Stretches   Active Hamstring Stretch  Right;Left;3 reps;30 seconds foot propped on 4" block    Other Knee/Hip Stretches  Trunk rotation stretch, 3 reps 10 sec each direction, supine knee to chest Marcello Moores test-type position lying supine, 2 reps x 10 sec each side)      Knee/Hip Exercises: Standing   Other Standing Knee Exercises  Heel/toe raises x 10 reps at counter, cues to keep knees straight      Knee/Hip Exercises: Supine   Bridges  Strengthening;Both;1 set;10 reps       With hamstring stretches, pt needs postural cues for correct technique to achieve optimal stretch each rep.  [facility]) - 06/01/18 0258    PWR! Up  x 10 reps    PWR! Rock  x 10 reps forward and back with tactile cues    PWR! Step  x 10 reps at parallel bars-cues for BIG foot clearancea nd BIG step    Comments  Modified quadruped position with UE support at parallel bars    Basic 4 Flow  2 sets of 10 PWR! UP  in sitting> sit<>stand with PWR! Up upon standing x 10 reps; then standing PWR! Up 2 sets x 5 reps    Comments  Verbal, tactile cues for upright posture in varied PWR! Up positions.      Pt performs slowed manner, with therapist tactile and verbal cues provided.      PT Short Term Goals - 05/29/18 1427      PT SHORT TERM GOAL #1   Title  Pt will perform HEP with wife's supervision for improved functional mobility and balance.  TARGET 05/29/18    Time  4    Period  Weeks    Status  Partially Met      PT SHORT TERM GOAL #2   Title  Pt will improve 5x sit<>stand to less than or equal to 17 seconds for improved efficiency and safety with gait.    Time  4    Period  Weeks    Status  Achieved      PT SHORT TERM GOAL #3   Title  Pt will improve TUG score to less than or equal to 20 seconds for decreased fall risk.    Time  4    Period  Weeks    Status  Achieved      PT SHORT TERM GOAL #4   Title  Pt/wife will verbalize understanding of tips to reduce  freezing with gait and turns.    Time  4    Period  Weeks    Status  Achieved        PT Long Term Goals - 05/29/18 1427      PT LONG TERM GOAL #1   Title  Pt/wife will verbalize understanding of fall prevention in home environment.  TARGET    Time  8    Period  Weeks    Status  New      PT LONG TERM GOAL #2   Title  Pt will improve 5x sit<>stand to less than or equal to 14 seconds for decreased fall risk.    Time  8    Period  Weeks    Status  New      PT LONG TERM GOAL #3   Title  Pt will improve TUG score to less than or equal to 15 seconds ofr decreased fall risk.    Time  8    Period  Weeks    Status  Achieved      PT LONG TERM GOAL #4   Title  Pt will improve gait velocity to at least 2.62 ft/sec for improved gait efficiency and safety.    Time  8    Period  Weeks    Status  New      PT LONG TERM GOAL #5   Title  Pt/wife will verbalize plans for ongoing community fitness upon d/c from PT.    Time  8    Period  Weeks    Status  New            Plan - 06/01/18 1142    Clinical Impression Statement  Skilled PT session focusing on posture this visit-both active and dynamic with PWR! Up in various positions, and more static with stretching exercises.  Pt requires tactile, verbal, visual cues throughout session, and even throughout set of exercises, as his form fluctuates from rep to rep.  Pt may benefit from additional skilled physical therapy for postural stretching, strengthening to assist with more upright posture in standing and gait activities.  Rehab Potential  Good    PT Frequency  2x / week    PT Duration  8 weeks plus eval    PT Treatment/Interventions  ADLs/Self Care Home Management;DME Instruction;Gait training;Stair training;Functional mobility training;Therapeutic activities;Therapeutic exercise;Balance training;Neuromuscular re-education;Patient/family education    PT Next Visit Plan  Continue postural stretching and strengthening exercises; gait  training with focus on posture    Consulted and Agree with Plan of Care  Patient;Family member/caregiver    Family Member Consulted  wife       Patient will benefit from skilled therapeutic intervention in order to improve the following deficits and impairments:  Abnormal gait, Decreased balance, Decreased mobility, Decreased safety awareness, Difficulty walking, Decreased strength, Postural dysfunction  Visit Diagnosis: Abnormal posture  Muscle weakness (generalized)  Other symptoms and signs involving the nervous system     Problem List Patient Active Problem List   Diagnosis Date Noted  . Parkinsonism ([place]) 07/30/2017  . 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 Agitation 11/18/2014  . Complicated migraine 41/89/3737  . Cognitive and behavioral changes 11/05/2014  . Altered mental status   . History of migraine headaches   . Acute CVA (cerebrovascular accident) ([place]) 10/27/2014  . Aphasia   . Thoracic aortic aneurysm ([place]) 11/04/2013  . Aortic insufficiency 11/04/2013  . Obstructive sleep apnea 11/04/2013    Jazz Rogala W. 06/01/2018, 11:45 AM Frazier Butt., PT [facility] [address] [place], [place], [address] Phone: [phone]   Fax:  [phone]  Name: Dejay Kronk MRN: [id number] Date of Birth: 1941-08-21

## 2018-06-03 ENCOUNTER — Ambulatory Visit: Payer: PPO

## 2018-06-03 ENCOUNTER — Encounter: Payer: Self-pay | Admitting: Physical Therapy

## 2018-06-03 ENCOUNTER — Ambulatory Visit: Payer: PPO | Admitting: Physical Therapy

## 2018-06-03 ENCOUNTER — Ambulatory Visit: Payer: PPO | Admitting: Occupational Therapy

## 2018-06-03 DIAGNOSIS — R471 Dysarthria and anarthria: Secondary | ICD-10-CM

## 2018-06-03 DIAGNOSIS — R2689 Other abnormalities of gait and mobility: Secondary | ICD-10-CM

## 2018-06-03 DIAGNOSIS — R293 Abnormal posture: Secondary | ICD-10-CM

## 2018-06-03 DIAGNOSIS — R278 Other lack of coordination: Secondary | ICD-10-CM

## 2018-06-03 DIAGNOSIS — R1312 Dysphagia, oropharyngeal phase: Secondary | ICD-10-CM

## 2018-06-03 DIAGNOSIS — R29818 Other symptoms and signs involving the nervous system: Secondary | ICD-10-CM

## 2018-06-03 DIAGNOSIS — M6281 Muscle weakness (generalized): Secondary | ICD-10-CM

## 2018-06-03 DIAGNOSIS — R29898 Other symptoms and signs involving the musculoskeletal system: Secondary | ICD-10-CM

## 2018-06-03 NOTE — Therapy (Signed)
[facility] [address] [place], [place], [address] Phone: [phone]   Fax:  [phone]  Occupational Therapy Treatment  Patient Details  Name: Christopher Atkinson MRN: [id number] Date of Birth: 1941-08-20 Referring Provider: Dr. Carles Collet    Encounter Date: 06/03/2018  OT End of Session - 06/03/18 1306    Visit Number  8    Number of Visits  17    Date for OT Re-Evaluation  07/05/18    Authorization Type  Medicare    Authorization Time Period  2x week x 8 weeks with 12 week cert period    OT Start Time  1020    OT Stop Time  1100    OT Time Calculation (min)  40 min    Activity Tolerance  Patient tolerated treatment well    Behavior During Therapy  [facility] for tasks assessed/performed       Past Medical History:  Diagnosis Date  . Aortic insufficiency   . Headache   . Obstructive sleep apnea    on CPAP  . Thoracic aortic aneurysm [facility])     Past Surgical History:  Procedure Laterality Date  . BACK SURGERY    . CATARACT EXTRACTION, BILATERAL    . CHOLECYSTECTOMY    . HERNIA REPAIR    . HERNIA REPAIR    . HERNIA REPAIR    . NOSE SURGERY      There were no vitals filed for this visit.            Treatment: dynamic step and reach to place items in overhead cabinet with right then left UE's mod v.c for upright posture and large amplitude movements. Seated picking up pennies to place in container with bilateral UE's mod v.c for big movements            OT Education - 06/03/18 1304    Education provided  Yes    Education Details  PWR! basic 4 seated 10 reps each, mod v.c/ demonstration, adapted strategy for dooning/ doffing jacket like a cape x 3 reps, min-mod v.c    Person(s) Educated  Patient;Spouse    Methods  Explanation;Verbal cues;Demonstration    Comprehension  Returned demonstration;Verbalized understanding;Verbal cues required       OT Short Term Goals - 06/03/18 1040      OT SHORT  TERM GOAL #1   Title  I with HEP    Time  4    Period  Weeks    Status  Achieved      OT SHORT TERM GOAL #2   Title  Pt/ wife will be I with adapted strategies for ADLs/IADLS    Time  4    Period  Weeks    Status  On-going      OT SHORT TERM GOAL #3   Title  Pt will demonstrate improved fine motor coordination for ADLS as evidenced by decreaseing bilteral 9 hole peg test score by 5 secs.    Baseline  RUE 60.28 secs, LUE 49.78 secs    Time  4    Period  Weeks    Status  On-going RUE 51.81 secs, LUE 57.22 secs- met for RUE only      OT SHORT TERM GOAL #4   Title  Pt will demonstrate improved ease with dressing as eveidenced by decreasing PPT#4 to 75 secs of less.    Baseline  1 min 23 secs    Time  4    Period  Weeks  Status  On-going      OT SHORT TERM GOAL #5   Title  Pt will demonstate ability to retrieve a lightweight object with RUE at 120 shoulder flexion.    Time  8    Period  Weeks    Status  On-going      OT SHORT TERM GOAL #6   Title  Pt will demonstrate ability to retrieve a lightweight object at 120 shoulder flexion with -15 elbow extension with LUE.    Time  8    Period  Weeks    Status  On-going        OT Long Term Goals - 05/06/18 1015      OT LONG TERM GOAL #1   Title  Pt will demonstrate improved ease with fastening buttons as evidenced by decreasing 3 button / unbutton to 70 secs or less.    Baseline  79 secs    Time  8    Period  Weeks    Status  New    Target Date  07/05/18      OT LONG TERM GOAL #2   Title  pt will demonstrate increased RUE functional use for ADLS as evidenced by increasing RUE box/ blocks score to 30 blocks    Baseline  RUE 26 blocks, LUE 32 blocks    Time  8    Period  Weeks    Status  New      OT LONG TERM GOAL #3   Title  Pt/ wife will verbalize understanding of community resources and ways to prevent future PD related complications.     Time  8    Period  Weeks    Status  New      OT LONG TERM GOAL #4   Title   Pt / wife will verbalize understanding of ways to keep thinking skills sharp and compensatory strategies for cognitive changes associated with PD.    Time  8    Period  Weeks    Status  New            Plan - 06/03/18 1307    Clinical Impression Statement  Pt demonstrates improved perfromance of donning doffing jacket with adpted strategies.    Occupational Profile and client history currently impacting functional performance  PMH: Parkinson's disease, CVA, migraines, thoracic aortic aneurysm, sleep apnea, migraines    Occupational performance deficits (Please refer to evaluation for details):  ADL's;IADL's;Education;Play;Leisure;Social Participation    Rehab Potential  Good    OT Frequency  2x / week    OT Duration  8 weeks    OT Treatment/Interventions  Self-care/ADL training;Therapeutic exercise;Visual/perceptual remediation/compensation;Patient/family education;Neuromuscular education;Paraffin;Moist Heat;Fluidtherapy;Energy conservation;Therapist, nutritional;Therapeutic activities;Balance training;Cognitive remediation/compensation;Passive range of motion;Manual Therapy;DME and/or AE instruction;Ultrasound;Cryotherapy    Plan  reinforce donning/ doffing jacket, review fastening buttons, big movements with functional acitvity    Consulted and Agree with Plan of Care  Patient;Family member/caregiver    Family Member Consulted  wife       Patient will benefit from skilled therapeutic intervention in order to improve the following deficits and impairments:  Abnormal gait, Decreased cognition, Impaired flexibility, Decreased mobility, Decreased coordination, Decreased activity tolerance, Decreased endurance, Decreased range of motion, Decreased strength, Impaired UE functional use, Impaired perceived functional ability, Difficulty walking, Decreased safety awareness, Decreased knowledge of precautions, Decreased balance  Visit Diagnosis: Muscle weakness (generalized)  Other  symptoms and signs involving the nervous system  Other symptoms and signs involving the musculoskeletal system  Other lack  of coordination    Problem List Patient Active Problem List   Diagnosis Date Noted  . Parkinsonism ([place]) 07/30/2017  . 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 Agitation 11/18/2014  . Complicated migraine 05/09/3357  . Cognitive and behavioral changes 11/05/2014  . Altered mental status   . History of migraine headaches   . Acute CVA (cerebrovascular accident) ([place]) 10/27/2014  . Aphasia   . Thoracic aortic aneurysm ([place]) 11/04/2013  . Aortic insufficiency 11/04/2013  . Obstructive sleep apnea 11/04/2013    Merick Kelleher 06/03/2018, 1:09 PM Theone Murdoch, OTR/L [phone] Phone: [phone] 1:13 PM 06/03/18 [facility] [address] [place], [place], [address] Phone: [phone]   Fax:  [phone]  Name: Christopher Atkinson MRN: [id number] Date of Birth: [birth year]

## 2018-06-03 NOTE — Patient Instructions (Addendum)
HIP: Hamstrings - Short Sitting    Rest leg on raised surface. Keep knee straight. Lift chest. Staying tall through your chest, lean forward with hips until you feel a gentle stretch.  Hold _30__ seconds. _3__ reps per set, _2-3__ sets per day.  Copyright  VHI. All rights reserved.

## 2018-06-03 NOTE — Therapy (Signed)
[facility] [address] [place], [place], [address] Phone: [phone]   Fax:  [phone]  Speech Language Pathology Treatment  Patient Details  Name: Christopher Atkinson MRN: [id number] Date of Birth: 12-03-1941 Referring Provider: Alonza Bogus, DO   Encounter Date: 06/03/2018  End of Session - 06/03/18 0848    Visit Number  6    Number of Visits  17    Date for SLP Re-Evaluation  07/17/18    SLP Start Time  0803    SLP Stop Time   0845    SLP Time Calculation (min)  42 min       Past Medical History:  Diagnosis Date  . Aortic insufficiency   . Headache   . Obstructive sleep apnea    on CPAP  . Thoracic aortic aneurysm [facility])     Past Surgical History:  Procedure Laterality Date  . BACK SURGERY    . CATARACT EXTRACTION, BILATERAL    . CHOLECYSTECTOMY    . HERNIA REPAIR    . HERNIA REPAIR    . HERNIA REPAIR    . NOSE SURGERY      There were no vitals filed for this visit.  Subjective Assessment - 06/03/18 0804    Subjective  "I seem to keep phlegm."    Currently in Pain?  No/denies            ADULT SLP TREATMENT - 06/03/18 0810      General Information   Behavior/Cognition  Alert;Cooperative;Lethargic      Treatment Provided   Treatment provided  Dysphagia      Dysphagia Treatment   Temperature Spikes Noted  No    Respiratory Status  Room air    Treatment Methods  Skilled observation;Compensation strategy training;Patient/caregiver education    Patient observed directly with PO's  Yes    Type of PO's observed  Thin liquids    Amount of cueing  Moderate    Other treatment/comments  Pt req'd usual cues for HEP - did not ever completed Mendelsohn correctly - encouraged pt to cont to work on this at home. Wife is comfortable with procedure of this exercise and told SLP she will cont to cue pt PRN for this exercise. SLP reviewed swallow precautions with pt/wife and pt stated "they get me tired."  SLP ascertained that pt requires cues for precautions and this is "tiring" to him to use precautions when he hasn't had to in the past.      Assessment / Recommendations / Plan   Plan  Continue with current plan of care      Dysphagia Recommendations   Diet recommendations  Dysphagia 3 (mechanical soft)    Liquids provided via  Cup    Medication Administration  Whole meds with liquid    Supervision  Full supervision/cueing for compensatory strategies    Compensations  Effortful swallow;Clear throat intermittently;Multiple dry swallows after each bite/sip;Small sips/bites;Slow rate      Progression Toward Goals   Progression toward goals  Progressing toward goals       SLP Education - 06/03/18 0848    Education provided  Yes    Education Details  HEP procedure, swallow precautions    Person(s) Educated  Patient;Spouse    Methods  Explanation;Demonstration;Verbal cues    Comprehension  Verbalized understanding;Returned demonstration;Verbal cues required;Need further instruction       SLP Short Term Goals - 06/03/18 0850      SLP SHORT TERM GOAL #1  Title  pt will follow swallow precautions in 80% of opportunities during session with appropriate POs    Time  1    Period  Weeks or 9 total visits, for all STGs    Status  On-going      SLP SHORT TERM GOAL #2   Title  pt will demo swallowing HEP with occaisonal min A over two sessions    Time  1    Period  Weeks    Status  On-going      SLP SHORT TERM GOAL #3   Title  pt will produce /a/ with average upper 80s over 4 sessions    Time  1    Period  Weeks    Status  On-going      SLP SHORT TERM GOAL #4   Title  pt will participate in 3 minutes simple conversation with average volume 69 dB over two sessions    Time  1    Period  Weeks    Status  On-going      SLP SHORT TERM GOAL #5   Title  pt will answer simple questions with average 70dB in two therapy sessions    Time  1    Period  Weeks    Status  On-going        SLP Long Term Goals - 06/03/18 0850      SLP LONG TERM GOAL #1   Title  pt will follow swallow precautions >/= 90% of the time with written cues    Time  5    Period  Weeks or 17 total sessions, for all LTGs    Status  On-going      SLP LONG TERM GOAL #2   Title  pt will produce loud /a/ average upper 80s over 6 sessions    Time  5    Period  Weeks    Status  On-going      SLP LONG TERM GOAL #3   Title  pt will participate in 5+ minutes simple conversation with average volume in low 70s dB with occasional nonverbal cues    Time  5    Period  Weeks    Status  On-going      SLP LONG TERM GOAL #4   Title  pt/family will tell 3 overt s/s aspiration PNA    Time  5    Period  Weeks    Status  On-going       Plan - 06/03/18 0848    Clinical Impression Statement  Spouse present, she reports need to for cueing pt for small boluses. Pt performed HEP for dysphagia with min to mod A, spouse watched all exercises and reports comfort with cueing pt for procedure. Pt continues to present with sub normal volume affecting intellgilbity, and inconsistent carryover of swallow precauations. Continue skilled ST to maximize intellgilbility and safety of swallow    Speech Therapy Frequency  2x / week    Duration  -- 8 weeks or 17 visits    Treatment/Interventions  Aspiration precaution training;Pharyngeal strengthening exercises;Diet toleration management by SLP;Trials of upgraded texture/liquids;Internal/external aids;Multimodal communcation approach;Patient/family education;Compensatory strategies;SLP instruction and feedback;Cueing hierarchy;Environmental controls;Functional tasks    Potential to Achieve Goals  Fair    Potential Considerations  Severity of impairments;Ability to learn/carryover information    SLP Home Exercise Plan  HEP for dysphagia    Consulted and Agree with Plan of Care  Patient;Family member/caregiver    Family Member Consulted  spouse Lib  Patient will benefit  from skilled therapeutic intervention in order to improve the following deficits and impairments:   Dysphagia, oropharyngeal phase  Dysarthria and anarthria    Problem List Patient Active Problem List   Diagnosis Date Noted  . Parkinsonism ([place]) 07/30/2017  . 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 Agitation 11/18/2014  . Complicated migraine 79/72/8206  . Cognitive and behavioral changes 11/05/2014  . Altered mental status   . History of migraine headaches   . Acute CVA (cerebrovascular accident) ([place]) 10/27/2014  . Aphasia   . Thoracic aortic aneurysm ([place]) 11/04/2013  . Aortic insufficiency 11/04/2013  . Obstructive sleep apnea 11/04/2013    [facility] ,MS, CCC-SLP  06/03/2018, 8:51 AM.  [facility] [address] [place], [place], [address] Phone: [phone]   Fax:  [phone]   Name: Christopher Atkinson MRN: [id number] Date of Birth: April 09, 1941

## 2018-06-03 NOTE — Patient Instructions (Signed)
Continue to work on your swallow exercises - use a mirror with the tongue out swallow.

## 2018-06-03 NOTE — Therapy (Signed)
[facility] [address] [place], [place], [address] Phone: [phone]   Fax:  [phone]  Physical Therapy Treatment  Patient Details  Name: Christopher Atkinson MRN: [id number] Date of Birth: October 27, 1941 Referring Provider: Dr. Carles Collet    Encounter Date: 06/03/2018  PT End of Session - 06/03/18 2121    Visit Number  9    Number of Visits  17    Date for PT Re-Evaluation  07/30/18    Authorization Type  [employer]    PT Start Time  0932    PT Stop Time  1016    PT Time Calculation (min)  44 min    Equipment Utilized During Treatment  -- min guard to min A prn    Activity Tolerance  Patient tolerated treatment well;No increased pain    Behavior During Therapy  WFL for tasks assessed/performed       Past Medical History:  Diagnosis Date  . Aortic insufficiency   . Headache   . Obstructive sleep apnea    on CPAP  . Thoracic aortic aneurysm [facility])     Past Surgical History:  Procedure Laterality Date  . BACK SURGERY    . CATARACT EXTRACTION, BILATERAL    . CHOLECYSTECTOMY    . HERNIA REPAIR    . HERNIA REPAIR    . HERNIA REPAIR    . NOSE SURGERY      There were no vitals filed for this visit.  Subjective Assessment - 06/03/18 0933    Subjective  No changes since last visit.  Wife reports "he's showing off" walking today.    Patient is accompained by:  Family member wife-[name]    Pertinent History  PD, dementia, migraine, CVA, aphasia, OSA    Patient Stated Goals  Pt's goals for therapy are to be able to walk better and stand straighter.    Currently in Pain?  No/denies                       [facility] Adult PT Treatment/Exercise - 06/03/18 0001      Transfers   Transfers  Sit to Stand;Stand to Sit    Sit to Stand  5: Supervision;Without upper extremity assist;From chair/3-in-1;From bed    Stand to Sit  5: Supervision;With upper extremity assist;To bed;To chair/3-in-1    Number of Reps  2 sets  5 reps-cues for upright posture upon standing      Ambulation/Gait   Ambulation/Gait  Yes    Ambulation/Gait Assistance  5: Supervision;4: Min guard    Ambulation/Gait Assistance Details  verbal cues for upright posture (to stop and reset posture several times); cues to increase foot clearance with gait    Ambulation Distance (Feet)  60 Feet x 2; 230    Assistive device  None    Gait Pattern  Step-through pattern;Decreased arm swing - right;Decreased arm swing - left;Decreased step length - right;Decreased dorsiflexion - right;Shuffle;Festinating;Decreased trunk rotation;Trunk flexed;Narrow base of support;Poor foot clearance - right    Ambulation Surface  Indoor      Knee/Hip Exercises: Stretches   Active Hamstring Stretch  Right;Left;3 reps;30 seconds seated, foot propped on floor    Other Knee/Hip Stretches  Trunk rotation stretch, 3 reps 10 sec each direction, supine knee to chest Marcello Moores test-type position lying supine, 2 reps x 10 sec each side)    Other Knee/Hip Stretches  Single knee to chest for low back stretch, 3 reps 15 sec each  Knee/Hip Exercises: Aerobic   Nustep  Level 3, 4 extremities, x 6 minutes, 60 steps/minutes        [facility]) - 06/03/18 0957    PWR! exercises  Moves in quadraped Modified at [employer]! Up  x 10 reps tactile and verbal cues    PWR! Rock  x 10 reps forward and back with tactile cues    Comments  Modified quadruped at counter for focus on posture          PT Education - 06/03/18 2120    Education provided  Yes    Education Details  Hamstring stretch in sitting; try to use stationary bike at home (similar to using Nustep in therapy)    Person(s) Educated  Patient;Spouse    Methods  Explanation;Demonstration;Handout    Comprehension  Verbalized understanding;Returned demonstration;Verbal cues required       PT Short Term Goals - 05/29/18 1427      PT SHORT TERM GOAL #1   Title  Pt will perform HEP with wife's supervision for  improved functional mobility and balance.  TARGET 05/29/18    Time  4    Period  Weeks    Status  Partially Met      PT SHORT TERM GOAL #2   Title  Pt will improve 5x sit<>stand to less than or equal to 17 seconds for improved efficiency and safety with gait.    Time  4    Period  Weeks    Status  Achieved      PT SHORT TERM GOAL #3   Title  Pt will improve TUG score to less than or equal to 20 seconds for decreased fall risk.    Time  4    Period  Weeks    Status  Achieved      PT SHORT TERM GOAL #4   Title  Pt/wife will verbalize understanding of tips to reduce freezing with gait and turns.    Time  4    Period  Weeks    Status  Achieved        PT Long Term Goals - 05/29/18 1427      PT LONG TERM GOAL #1   Title  Pt/wife will verbalize understanding of fall prevention in home environment.  TARGET    Time  8    Period  Weeks    Status  New      PT LONG TERM GOAL #2   Title  Pt will improve 5x sit<>stand to less than or equal to 14 seconds for decreased fall risk.    Time  8    Period  Weeks    Status  New      PT LONG TERM GOAL #3   Title  Pt will improve TUG score to less than or equal to 15 seconds ofr decreased fall risk.    Time  8    Period  Weeks    Status  Achieved      PT LONG TERM GOAL #4   Title  Pt will improve gait velocity to at least 2.62 ft/sec for improved gait efficiency and safety.    Time  8    Period  Weeks    Status  New      PT LONG TERM GOAL #5   Title  Pt/wife will verbalize plans for ongoing community fitness upon d/c from PT.    Time  8    Period  Weeks  Status  New            Plan - 06/03/18 2121    Clinical Impression Statement  Skilled PT session focused on postural stretching and strengthening as well as gait training.  Pt continues to require verbal cues for stopping/resetting posture with gait.  Also requires cues for foot clearance.  Pt will continue to beneift from skilled PT for postural stretching and strengthening  and gait training for improved safety with gait.    Rehab Potential  Good    PT Frequency  2x / week    PT Duration  8 weeks plus eval    PT Treatment/Interventions  ADLs/Self Care Home Management;DME Instruction;Gait training;Stair training;Functional mobility training;Therapeutic activities;Therapeutic exercise;Balance training;Neuromuscular re-education;Patient/family education    PT Next Visit Plan  Continue postural stretching and strengthening exercises; gait training with focus on posture    Consulted and Agree with Plan of Care  Patient;Family member/caregiver    Family Member Consulted  wife       Patient will benefit from skilled therapeutic intervention in order to improve the following deficits and impairments:  Abnormal gait, Decreased balance, Decreased mobility, Decreased safety awareness, Difficulty walking, Decreased strength, Postural dysfunction  Visit Diagnosis: Abnormal posture  Other abnormalities of gait and mobility     Problem List Patient Active Problem List   Diagnosis Date Noted  . Parkinsonism ([place]) 07/30/2017  . 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 Agitation 11/18/2014  . Complicated migraine 66/66/4861  . Cognitive and behavioral changes 11/05/2014  . Altered mental status   . History of migraine headaches   . Acute CVA (cerebrovascular accident) ([place]) 10/27/2014  . Aphasia   . Thoracic aortic aneurysm ([place]) 11/04/2013  . Aortic insufficiency 11/04/2013  . Obstructive sleep apnea 11/04/2013    Kameria Canizares W. 06/03/2018, 9:25 PM  Frazier Butt., PT   [facility] [address] [place], [place], [address] Phone: [phone]   Fax:  [phone]  Name: Christopher Atkinson MRN: [id number] Date of Birth: [birth year]

## 2018-06-04 ENCOUNTER — Ambulatory Visit: Payer: PPO

## 2018-06-04 DIAGNOSIS — M6281 Muscle weakness (generalized): Secondary | ICD-10-CM | POA: Diagnosis not present

## 2018-06-04 DIAGNOSIS — R1312 Dysphagia, oropharyngeal phase: Secondary | ICD-10-CM

## 2018-06-04 DIAGNOSIS — R471 Dysarthria and anarthria: Secondary | ICD-10-CM

## 2018-06-04 NOTE — Therapy (Signed)
[facility] [address] [place], [place], [address] Phone: [phone]   Fax:  [phone]  Speech Language Pathology Treatment  Patient Details  Name: Matthan Sledge MRN: [id number] Date of Birth: [birth year] Referring Provider: Alonza Bogus, DO   Encounter Date: 06/04/2018  End of Session - 06/04/18 1124    Visit Number  7    Number of Visits  17    Date for SLP Re-Evaluation  07/17/18    SLP Start Time  0932    SLP Stop Time   2585    SLP Time Calculation (min)  43 min    Activity Tolerance  Patient tolerated treatment well       Past Medical History:  Diagnosis Date  . Aortic insufficiency   . Headache   . Obstructive sleep apnea    on CPAP  . Thoracic aortic aneurysm [facility])     Past Surgical History:  Procedure Laterality Date  . BACK SURGERY    . CATARACT EXTRACTION, BILATERAL    . CHOLECYSTECTOMY    . HERNIA REPAIR    . HERNIA REPAIR    . HERNIA REPAIR    . NOSE SURGERY      There were no vitals filed for this visit.  Subjective Assessment - 06/04/18 0938    Currently in Pain?  No/denies            ADULT SLP TREATMENT - 06/04/18 0939      General Information   Behavior/Cognition  Alert;Cooperative;Pleasant mood      Treatment Provided   Treatment provided  Cognitive-Linquistic      Dysphagia Treatment   Temperature Spikes Noted  No    Respiratory Status  Room air    Oral Cavity - Dentition  Adequate natural dentition;Missing dentition    Treatment Methods  Skilled observation;Compensation strategy training;Patient/caregiver education    Patient observed directly with PO's  Yes    Type of PO's observed  Dysphagia 3 (soft);Thin liquids    Feeding  Needs assist    Liquids provided via  Cup    Amount of cueing  Minimal usual cues for precautions      Cognitive-Linquistic Treatment   Treatment focused on  Dysarthria    Skilled Treatment  Loud /a/ was done today to recalibrate  volume, average upper 80s dB, with min-mod A for open mouth and loudness. He produced sentences in structured tasks with average low 70s dB and rare min nonverbal cues for volume. With incr'd cognitive load pt's response time increased and loudness decr'd. SLP again reiterated to pt/wife that pt must practice loud /a/ x5 reps BID, adn practice homework provided.       Assessment / Recommendations / Plan   Plan  Continue with current plan of care      Dysphagia Recommendations   Diet recommendations  Dysphagia 3 (mechanical soft)    Liquids provided via  Cup    Medication Administration  Whole meds with liquid    Supervision  Full supervision/cueing for compensatory strategies    Compensations  Effortful swallow;Clear throat intermittently;Multiple dry swallows after each bite/sip;Small sips/bites;Slow rate      Progression Toward Goals   Progression toward goals  Progressing toward goals       SLP Education - 06/03/18 0848    Education provided  Yes    Education Details  HEP procedure, swallow precautions    Person(s) Educated  Patient;Spouse    Methods  Explanation;Demonstration;Verbal cues  Comprehension  Verbalized understanding;Returned demonstration;Verbal cues required;Need further instruction       SLP Short Term Goals - 06/04/18 1126      SLP SHORT TERM GOAL #1   Title  pt will follow swallow precautions in 80% of opportunities during session with appropriate POs    Time  1    Period  -- or 9 total visits, for all STGs    Status  On-going      SLP SHORT TERM GOAL #2   Title  pt will demo swallowing HEP with occasional min A over two sessions    Time  1    Period  Weeks    Status  On-going      SLP SHORT TERM GOAL #3   Title  pt will produce /a/ with average upper 80s over 4 sessions    Baseline  06-04-18    Time  1    Period  Weeks    Status  On-going      SLP SHORT TERM GOAL #4   Title  pt will participate in 3 minutes simple conversation with average volume  69 dB over two sessions    Time  1    Period  Weeks    Status  On-going      SLP SHORT TERM GOAL #5   Title  pt will answer simple questions with average 70dB in two therapy sessions    Baseline  06-04-18    Time  1    Period  Weeks    Status  On-going       SLP Long Term Goals - 06/04/18 1133      SLP LONG TERM GOAL #1   Title  pt will follow swallow precautions >/= 90% of the time with written cues    Time  5    Period  Weeks or 17 total sessions, for all LTGs    Status  On-going      SLP LONG TERM GOAL #2   Title  pt will produce loud /a/ average upper 80s over 6 sessions    Time  5    Period  Weeks    Status  On-going      SLP LONG TERM GOAL #3   Title  pt will participate in 5+ minutes simple conversation with average volume in low 70s dB with occasional nonverbal cues    Time  5    Period  Weeks    Status  On-going      SLP LONG TERM GOAL #4   Title  pt/family will tell 3 overt s/s aspiration PNA    Time  5    Period  Weeks    Status  On-going       Plan - 06/04/18 1124    Clinical Impression Statement  SLP worked with pt today to perform swallow precautions with POs, and with loudness in conversation. Pt continues to present with sub normal volume affecting intellgilbity, and inconsistent carryover of swallow precauations. Continue skilled ST to maximize intellgilbility and safety of swallow    Speech Therapy Frequency  2x / week    Duration  -- 8 weeks or 17 visits    Treatment/Interventions  Aspiration precaution training;Pharyngeal strengthening exercises;Diet toleration management by SLP;Trials of upgraded texture/liquids;Internal/external aids;Multimodal communcation approach;Patient/family education;Compensatory strategies;SLP instruction and feedback;Cueing hierarchy;Environmental controls;Functional tasks    Potential to Achieve Goals  Fair    Potential Considerations  Severity of impairments;Ability to learn/carryover information    SLP Home  Exercise  Plan  HEP for dysphagia    Consulted and Agree with Plan of Care  Patient;Family member/caregiver    Family Member Consulted  spouse Lib       Patient will benefit from skilled therapeutic intervention in order to improve the following deficits and impairments:   Dysphagia, oropharyngeal phase  Dysarthria and anarthria    Problem List Patient Active Problem List   Diagnosis Date Noted  . Parkinsonism ([place]) 07/30/2017  . 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 Agitation 11/18/2014  . Complicated migraine 68/10/5725  . Cognitive and behavioral changes 11/05/2014  . Altered mental status   . History of migraine headaches   . Acute CVA (cerebrovascular accident) ([place]) 10/27/2014  . Aphasia   . Thoracic aortic aneurysm ([place]) 11/04/2013  . Aortic insufficiency 11/04/2013  . Obstructive sleep apnea 11/04/2013    [facility] ,MS, CCC-SLP  06/04/2018, 11:36 AM  [facility] [address] [place], [place], [address] Phone: [phone]   Fax:  [phone]   Name: Yu Peggs MRN: [id number] Date of Birth: 02-01-1941

## 2018-06-08 ENCOUNTER — Ambulatory Visit: Payer: PPO | Admitting: Speech Pathology

## 2018-06-08 ENCOUNTER — Ambulatory Visit: Payer: PPO | Admitting: Occupational Therapy

## 2018-06-08 ENCOUNTER — Ambulatory Visit: Payer: PPO | Admitting: Physical Therapy

## 2018-06-08 ENCOUNTER — Encounter: Payer: Self-pay | Admitting: Physical Therapy

## 2018-06-08 VITALS — BP 106/58 | HR 44

## 2018-06-08 DIAGNOSIS — R29818 Other symptoms and signs involving the nervous system: Secondary | ICD-10-CM

## 2018-06-08 DIAGNOSIS — R29898 Other symptoms and signs involving the musculoskeletal system: Secondary | ICD-10-CM

## 2018-06-08 DIAGNOSIS — R41844 Frontal lobe and executive function deficit: Secondary | ICD-10-CM

## 2018-06-08 DIAGNOSIS — R471 Dysarthria and anarthria: Secondary | ICD-10-CM

## 2018-06-08 DIAGNOSIS — R293 Abnormal posture: Secondary | ICD-10-CM

## 2018-06-08 DIAGNOSIS — M6281 Muscle weakness (generalized): Secondary | ICD-10-CM

## 2018-06-08 DIAGNOSIS — R1312 Dysphagia, oropharyngeal phase: Secondary | ICD-10-CM

## 2018-06-08 DIAGNOSIS — R2689 Other abnormalities of gait and mobility: Secondary | ICD-10-CM

## 2018-06-08 DIAGNOSIS — R278 Other lack of coordination: Secondary | ICD-10-CM

## 2018-06-08 NOTE — Therapy (Signed)
[facility] [address] [place], [place], [address] Phone: [phone]   Fax:  [phone]  Physical Therapy Treatment  Patient Details  Name: Christopher Atkinson MRN: [id number] Date of Birth: 03-19-1941 Referring Provider: Dr. Carles Collet    Encounter Date: 06/08/2018  PT End of Session - 06/08/18 1147    Visit Number  10    Number of Visits  17    Date for PT Re-Evaluation  07/30/18    Authorization Type  [employer]    PT Start Time  0930    PT Stop Time  1016    PT Time Calculation (min)  46 min    Equipment Utilized During Treatment  Gait belt    Activity Tolerance  Patient tolerated treatment well;No increased pain    Behavior During Therapy  WFL for tasks assessed/performed       Past Medical History:  Diagnosis Date  . Aortic insufficiency   . Headache   . Obstructive sleep apnea    on CPAP  . Thoracic aortic aneurysm [facility])     Past Surgical History:  Procedure Laterality Date  . BACK SURGERY    . CATARACT EXTRACTION, BILATERAL    . CHOLECYSTECTOMY    . HERNIA REPAIR    . HERNIA REPAIR    . HERNIA REPAIR    . NOSE SURGERY      There were no vitals filed for this visit.  Subjective Assessment - 06/08/18 0933    Subjective  Wife reports more unsteady than usual the last 2 days.    Patient is accompained by:  Family member wife-[name]    Pertinent History  PD, dementia, migraine, CVA, aphasia, OSA    Patient Stated Goals  Pt's goals for therapy are to be able to walk better and stand straighter.    Currently in Pain?  No/denies                       [facility] Adult PT Treatment/Exercise - 06/08/18 0935      Transfers   Transfers  Sit to Stand;Stand to Sit    Sit to Stand  5: Supervision;Without upper extremity assist;From chair/3-in-1;From bed    Stand to Sit  5: Supervision;With upper extremity assist;To bed;To chair/3-in-1    Number of Reps  2 sets;Other reps (comment) 5 reps from mat  surface, cues to slow descent    Comments  Upon standing, cues for upright posture.      Ambulation/Gait   Ambulation/Gait  Yes    Ambulation/Gait Assistance  4: Min guard    Ambulation/Gait Assistance Details  Verbal and tactile cues for upright posture-need to stop and reset posture with gait (with and without rollator)    Ambulation Distance (Feet)  120 Feet 200, then 120 ft x 2 no device    Assistive device  Rollator;None    Gait Pattern  Step-through pattern;Decreased arm swing - right;Decreased arm swing - left;Decreased step length - right;Decreased dorsiflexion - right;Shuffle;Festinating;Decreased trunk rotation;Trunk flexed;Narrow base of support;Poor foot clearance - right;Decreased step length - left;Decreased dorsiflexion - left;Poor foot clearance - left    Ambulation Surface  Indoor;Level    Gait Comments  Several episodes today with L foot catching with decreased L step length.  PT provides cues for increased step length/heelstrike.  Pt has tendency to let go of rollator and lunge towards mat to sit (with festinating episodes).  Cues to stop and take BIG step all the way  to destination to turn to sit.  Reviewed tips to reduce freezing with gait and turns.      Knee/Hip Exercises: Stretches   Active Hamstring Stretch  Right;Left;3 reps;30 seconds seated, to promote flexibility for upright standing          Balance Exercises - 06/08/18 1008      Balance Exercises: Standing   Marching Limitations  Marching in place 2 sets x 10 reps    Other Standing Exercises  squats to stand upright posture, x 3 reps, side step and weightshift x 10 reps cues to reset tall posture; forward step and weightshift x 10 reps each side stepping over 2" block to increase foot clearance.        Pt requires cues throughout standing exercises for upright posture and UE support at counter.  PT Education - 06/08/18 1146    Education provided  Yes    Education Details  REview of tips to reduce  freezing episodes, how wife can cue/supervise on "off" days of movement, like today, to improve safety    Person(s) Educated  Patient    Methods  Explanation;Demonstration    Comprehension  Verbalized understanding;Need further instruction wife verbalizes understanding       PT Short Term Goals - 05/29/18 1427      PT SHORT TERM GOAL #1   Title  Pt will perform HEP with wife's supervision for improved functional mobility and balance.  TARGET 05/29/18    Time  4    Period  Weeks    Status  Partially Met      PT SHORT TERM GOAL #2   Title  Pt will improve 5x sit<>stand to less than or equal to 17 seconds for improved efficiency and safety with gait.    Time  4    Period  Weeks    Status  Achieved      PT SHORT TERM GOAL #3   Title  Pt will improve TUG score to less than or equal to 20 seconds for decreased fall risk.    Time  4    Period  Weeks    Status  Achieved      PT SHORT TERM GOAL #4   Title  Pt/wife will verbalize understanding of tips to reduce freezing with gait and turns.    Time  4    Period  Weeks    Status  Achieved        PT Long Term Goals - 05/29/18 1427      PT LONG TERM GOAL #1   Title  Pt/wife will verbalize understanding of fall prevention in home environment.  TARGET    Time  8    Period  Weeks    Status  New      PT LONG TERM GOAL #2   Title  Pt will improve 5x sit<>stand to less than or equal to 14 seconds for decreased fall risk.    Time  8    Period  Weeks    Status  New      PT LONG TERM GOAL #3   Title  Pt will improve TUG score to less than or equal to 15 seconds ofr decreased fall risk.    Time  8    Period  Weeks    Status  Achieved      PT LONG TERM GOAL #4   Title  Pt will improve gait velocity to at least 2.62 ft/sec for improved gait efficiency and safety.  Time  8    Period  Weeks    Status  New      PT LONG TERM GOAL #5   Title  Pt/wife will verbalize plans for ongoing community fitness upon d/c from PT.    Time  8     Period  Weeks    Status  New            Plan - 06/08/18 1148    Clinical Impression Statement  Pt comes to PT session from speech session, using clinic rollator, as speech therapist reports he is more unsteady today.  Wife reports he has had unsteady days yesterday and today-unsure of reason.   Focused on education/practice with gait safety with review of tips to reduce freezing with gait and turns.  Pt requires cueing to reset posture, stay close to rollator, to increase foot clearance throughout gait trials, and he appears unable to self-correct.  With and without rollator, he is having decreased foot clearance today.  Pt will continue to benefit from skilled PT for gait training, functional mobility, postural stretching and strengthening.    Rehab Potential  Good    PT Frequency  2x / week    PT Duration  8 weeks plus eval    PT Treatment/Interventions  ADLs/Self Care Home Management;DME Instruction;Gait training;Stair training;Functional mobility training;Therapeutic activities;Therapeutic exercise;Balance training;Neuromuscular re-education;Patient/family education    PT Next Visit Plan  Gait training with focus on posture-education with wife on cues for safety with gait/review tips to reduce freezing with gait    Consulted and Agree with Plan of Care  Patient;Family member/caregiver    Family Member Consulted  wife       Patient will benefit from skilled therapeutic intervention in order to improve the following deficits and impairments:  Abnormal gait, Decreased balance, Decreased mobility, Decreased safety awareness, Difficulty walking, Decreased strength, Postural dysfunction  Visit Diagnosis: Other abnormalities of gait and mobility  Abnormal posture  Other symptoms and signs involving the nervous system     Problem List Patient Active Problem List   Diagnosis Date Noted  . Parkinsonism ([place]) 07/30/2017  . 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 Agitation  11/18/2014  . Complicated migraine 85/01/7740  . Cognitive and behavioral changes 11/05/2014  . Altered mental status   . History of migraine headaches   . Acute CVA (cerebrovascular accident) ([place]) 10/27/2014  . Aphasia   . Thoracic aortic aneurysm ([place]) 11/04/2013  . Aortic insufficiency 11/04/2013  . Obstructive sleep apnea 11/04/2013    Abb[place] Matuska W. 06/08/2018, 11:53 AM  Frazier Butt., PT   [facility] [address] [place], [place], [address] Phone: [phone]   Fax:  [phone]  Name: Christopher Atkinson MRN: [id number] Date of Birth: Dec 25, 1940

## 2018-06-08 NOTE — Therapy (Signed)
[facility] [address] [place], [place], [address] Phone: [phone]   Fax:  [phone]  Occupational Therapy Treatment  Patient Details  Name: Christopher Atkinson MRN: [id number] Date of Birth: November 08, 1941 Referring Provider: Dr. Carles Collet    Encounter Date: 06/08/2018  OT End of Session - 06/08/18 1339    Visit Number  9    Number of Visits  17    Date for OT Re-Evaluation  07/05/18    Authorization Time Period  2x week x 8 weeks with 12 week cert period    OT Start Time  1020    OT Stop Time  1100    OT Time Calculation (min)  40 min       Past Medical History:  Diagnosis Date  . Aortic insufficiency   . Headache   . Obstructive sleep apnea    on CPAP  . Thoracic aortic aneurysm [facility])     Past Surgical History:  Procedure Laterality Date  . BACK SURGERY    . CATARACT EXTRACTION, BILATERAL    . CHOLECYSTECTOMY    . HERNIA REPAIR    . HERNIA REPAIR    . HERNIA REPAIR    . NOSE SURGERY      Vitals:   06/08/18 1024  BP: (!) 106/58  Pulse: (!) 44    Subjective Assessment - 06/08/18 1337    Subjective   Pt's wife reports pt missed a dose of his medication yesterday and he's a bit off today    Pertinent History  PMH: Parkinson's disease, CVA, migraines, thoracic aortic aneurysm, sleep apnea, migraines    Patient Stated Goals  To maintain his independence    Currently in Pain?  No/denies         Treatment: Functional reaching to right and left sides in seated with trunk rotation, mod v.c for larger amplitude movements. Ambulating with rollator today to front from gym, min v.c for upright posture, big steps.               Balance Exercises - 06/08/18 1008      Balance Exercises: Standing   Marching Limitations  Marching in place 2 sets x 10 reps    Other Standing Exercises  squats to stand upright posture, x 3 reps, side step and weightshift x 10 reps cues to reset tall posture; forward step  and weightshift x 10 reps each side stepping over 2" block to increase foot clearance.          OT Education - 06/08/18 1338    Education provided  Yes    Education Details  [employer]! basic 4 seated 10 reps each, min-mod v.c and demonstration    Person(s) Educated  Patient;Spouse    Methods  Explanation;Verbal cues;Demonstration    Comprehension  Returned demonstration;Verbalized understanding;Verbal cues required       OT Short Term Goals - 06/03/18 1040      OT SHORT TERM GOAL #1   Title  I with HEP    Time  4    Period  Weeks    Status  Achieved      OT SHORT TERM GOAL #2   Title  Pt/ wife will be I with adapted strategies for ADLs/IADLS    Time  4    Period  Weeks    Status  On-going      OT SHORT TERM GOAL #3   Title  Pt will demonstrate improved fine motor coordination for ADLS as evidenced  by decreaseing bilteral 9 hole peg test score by 5 secs.    Baseline  RUE 60.28 secs, LUE 49.78 secs    Time  4    Period  Weeks    Status  On-going RUE 51.81 secs, LUE 57.22 secs- met for RUE only      OT SHORT TERM GOAL #4   Title  Pt will demonstrate improved ease with dressing as eveidenced by decreasing PPT#4 to 75 secs of less.    Baseline  1 min 23 secs    Time  4    Period  Weeks    Status  On-going      OT SHORT TERM GOAL #5   Title  Pt will demonstate ability to retrieve a lightweight object with RUE at 120 shoulder flexion.    Time  8    Period  Weeks    Status  On-going      OT SHORT TERM GOAL #6   Title  Pt will demonstrate ability to retrieve a lightweight object at 120 shoulder flexion with -15 elbow extension with LUE.    Time  8    Period  Weeks    Status  On-going        OT Long Term Goals - 05/06/18 1015      OT LONG TERM GOAL #1   Title  Pt will demonstrate improved ease with fastening buttons as evidenced by decreasing 3 button / unbutton to 70 secs or less.    Baseline  79 secs    Time  8    Period  Weeks    Status  New    Target Date   07/05/18      OT LONG TERM GOAL #2   Title  pt will demonstrate increased RUE functional use for ADLS as evidenced by increasing RUE box/ blocks score to 30 blocks    Baseline  RUE 26 blocks, LUE 32 blocks    Time  8    Period  Weeks    Status  New      OT LONG TERM GOAL #3   Title  Pt/ wife will verbalize understanding of community resources and ways to prevent future PD related complications.     Time  8    Period  Weeks    Status  New      OT LONG TERM GOAL #4   Title  Pt / wife will verbalize understanding of ways to keep thinking skills sharp and compensatory strategies for cognitive changes associated with PD.    Time  8    Period  Weeks    Status  New            Plan - 06/08/18 1339    Clinical Impression Statement  Pt demonstrates increased lethargy today and slower performance of activities. Pt missed a dose of his meds yesterday.    Occupational Profile and client history currently impacting functional performance  PMH: Parkinson's disease, CVA, migraines, thoracic aortic aneurysm, sleep apnea, migraines    Occupational performance deficits (Please refer to evaluation for details):  ADL's;IADL's;Education;Play;Leisure;Social Participation    Rehab Potential  Good    Current Impairments/barriers affecting progress:  cogntive deficits, abnormal posture, bradykinesia, abnormal posture    OT Frequency  2x / week    OT Duration  8 weeks    Plan  reinforce donning/ doffing jacket, finish checking short term goals, dynamic step and reach    Consulted and Agree with Plan of Care  Patient;Family member/caregiver    Family Member Consulted  wife       Patient will benefit from skilled therapeutic intervention in order to improve the following deficits and impairments:  Abnormal gait, Decreased cognition, Impaired flexibility, Decreased mobility, Decreased coordination, Decreased activity tolerance, Decreased endurance, Decreased range of motion, Decreased strength, Impaired  UE functional use, Impaired perceived functional ability, Difficulty walking, Decreased safety awareness, Decreased knowledge of precautions, Decreased balance  Visit Diagnosis: Abnormal posture  Other symptoms and signs involving the nervous system  Muscle weakness (generalized)  Other symptoms and signs involving the musculoskeletal system  Other lack of coordination  Frontal lobe and executive function deficit    Problem List Patient Active Problem List   Diagnosis Date Noted  . Parkinsonism ([place]) 07/30/2017  . 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 Agitation 11/18/2014  . Complicated migraine 00/92/0041  . Cognitive and behavioral changes 11/05/2014  . Altered mental status   . History of migraine headaches   . Acute CVA (cerebrovascular accident) ([place]) 10/27/2014  . Aphasia   . Thoracic aortic aneurysm ([place]) 11/04/2013  . Aortic insufficiency 11/04/2013  . Obstructive sleep apnea 11/04/2013    RINE,KATHRYN 06/08/2018, 1:41 PM Theone Murdoch, OTR/L [phone] Phone: [phone] 1:44 PM 06/08/18 [facility] [address] [place], [place], [address] Phone: [phone]   Fax:  [phone]  Name: Christopher Atkinson MRN: [id number] Date of Birth: 04/09/1941

## 2018-06-08 NOTE — Patient Instructions (Addendum)
   Try taking your pills with applesauce, yogurt or pudding instead of water. This will help make sure everything goes down together, at the same time.

## 2018-06-08 NOTE — Therapy (Signed)
[facility] [address] [place], [place], [address] Phone: [phone]   Fax:  [phone]  Speech Language Pathology Treatment  Patient Details  Name: Christopher Atkinson MRN: [id number] Date of Birth: 04/18/1941 Referring Provider: Alonza Bogus, DO   Encounter Date: 06/08/2018  End of Session - 06/08/18 1029    Visit Number  8    Number of Visits  17    Date for SLP Re-Evaluation  07/17/18    SLP Start Time  0846    SLP Stop Time   0929    SLP Time Calculation (min)  43 min    Activity Tolerance  Patient tolerated treatment well       Past Medical History:  Diagnosis Date  . Aortic insufficiency   . Headache   . Obstructive sleep apnea    on CPAP  . Thoracic aortic aneurysm [facility])     Past Surgical History:  Procedure Laterality Date  . BACK SURGERY    . CATARACT EXTRACTION, BILATERAL    . CHOLECYSTECTOMY    . HERNIA REPAIR    . HERNIA REPAIR    . HERNIA REPAIR    . NOSE SURGERY      There were no vitals filed for this visit.  Subjective Assessment - 06/08/18 0850    Subjective  "Feeling dizzy." Pt arrived without walker, stumbling several times on way back to ST room.    Currently in Pain?  No/denies            ADULT SLP TREATMENT - 06/08/18 0852      General Information   Behavior/Cognition  Alert;Cooperative;Pleasant mood    Patient Positioning  Upright in chair      Treatment Provided   Treatment provided  Dysphagia      Dysphagia Treatment   Temperature Spikes Noted  No    Respiratory Status  Room air    Oral Cavity - Dentition  Adequate natural dentition;Missing dentition    Treatment Methods  Skilled observation;Compensation strategy training;Patient/caregiver education    Patient observed directly with PO's  No    Other treatment/comments  Wife reported inconsistent response to her cues to reduce bolus size and rate of intake. Pt told SLP he took medications and found one in his  mouth later on. Pt reports he is taking pills with water vs in puree. SLP educated pt re: rationale for taking medications whole with applesauce, pudding or yogurt to reduce aspiration risk and improve bolus cohesion.       Pain Assessment   Pain Assessment  No/denies pain      Cognitive-Linquistic Treatment   Treatment focused on  Dysarthria    Skilled Treatment  Pt's wife reported pt "had a bad day yesterday," has had increased difficulty walking the last 2 days. Pt stumbled several times on the way to [place] room; SLP provided clinic rolling walker before transferring to PT session. Loud /a/ was done today to recalibrate volume, average mid 80s dB, with frequent mod A for open mouth and loudness. He produced sentences in structured tasks with average low 70s dB and occasional min nonverbal cues for volume. With incr'd cognitive load pt's response time increased and loudness decr'd. Responded to simple questions with usual non-verbal cues required for loudness. Wife reports pt is practicing /a/ at home several times a day.       Assessment / Recommendations / Plan   Plan  Continue with current plan of care  Dysphagia Recommendations   Diet recommendations  Dysphagia 3 (mechanical soft)    Liquids provided via  Cup    Medication Administration  Whole meds with puree    Supervision  Full supervision/cueing for compensatory strategies    Compensations  Effortful swallow;Clear throat intermittently;Multiple dry swallows after each bite/sip;Small sips/bites;Slow rate "hock"      Progression Toward Goals   Progression toward goals  Progressing toward goals       SLP Education - 06/08/18 1040    Education provided  Yes    Education Details  take pills whole in puree (applesauce, pudding or yogurt)    Person(s) Educated  Patient;Spouse    Methods  Explanation;Handout    Comprehension  Verbalized understanding       SLP Short Term Goals - 06/08/18 0909      SLP SHORT TERM GOAL #1   Title   pt will follow swallow precautions in 80% of opportunities during session with appropriate POs    Time  1    Status  Not Met      SLP SHORT TERM GOAL #2   Title  pt will demo swallowing HEP with occasional min A over two sessions    Status  Not Met      SLP SHORT TERM GOAL #3   Title  pt will produce /a/ with average upper 80s over 4 sessions    Baseline  06-04-18    Period  Weeks    Status  Partially Met      SLP SHORT TERM GOAL #4   Title  pt will participate in 3 minutes simple conversation with average volume 69 dB over two sessions    Time  1    Period  Weeks    Status  Partially Met      SLP SHORT TERM GOAL #5   Title  pt will answer simple questions with average 70dB in two therapy sessions    Baseline  06-04-18    Status  Partially Met       SLP Long Term Goals - 06/08/18 1028      SLP LONG TERM GOAL #1   Title  pt will follow swallow precautions >/= 90% of the time with written cues    Time  4    Period  Weeks    Status  On-going      SLP LONG TERM GOAL #2   Title  pt will produce loud /a/ average upper 80s over 6 sessions    Time  4    Period  Weeks    Status  On-going      SLP LONG TERM GOAL #3   Title  pt will participate in 5+ minutes simple conversation with average volume in low 70s dB with occasional nonverbal cues    Time  4    Period  Weeks    Status  On-going      SLP LONG TERM GOAL #4   Title  pt/family will tell 3 overt s/s aspiration PNA    Time  4    Status  On-going       Plan - 06/08/18 1030    Clinical Impression Statement  SLP worked with pt today with loudness in simple responses and conversation. Education provided re: swallow precautions, particularly taking medications with puree as recommended per MBS. Pt continues to present with sub normal volume affecting intellgilbity, and inconsistent carryover of swallow precauations. Continue skilled ST to maximize intellgilbility and safety of  swallow.    Speech Therapy Frequency  2x /  week    Treatment/Interventions  Aspiration precaution training;Pharyngeal strengthening exercises;Diet toleration management by SLP;Trials of upgraded texture/liquids;Internal/external aids;Multimodal communcation approach;Patient/family education;Compensatory strategies;SLP instruction and feedback;Cueing hierarchy;Environmental controls;Functional tasks    Potential to Achieve Goals  Fair    Potential Considerations  Severity of impairments;Ability to learn/carryover information    SLP Home Exercise Plan  HEP for dysphagia    Consulted and Agree with Plan of Care  Patient;Family member/caregiver    Family Member Consulted  spouse Lib       Patient will benefit from skilled therapeutic intervention in order to improve the following deficits and impairments:   Dysphagia, oropharyngeal phase  Dysarthria and anarthria    Problem List Patient Active Problem List   Diagnosis Date Noted  . Parkinsonism ([place]) 07/30/2017  . 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 Agitation 11/18/2014  . Complicated migraine 00/34/9611  . Cognitive and behavioral changes 11/05/2014  . Altered mental status   . History of migraine headaches   . Acute CVA (cerebrovascular accident) ([place]) 10/27/2014  . Aphasia   . Thoracic aortic aneurysm ([place]) 11/04/2013  . Aortic insufficiency 11/04/2013  . Obstructive sleep apnea 11/04/2013   Deneise Lever, [place], [place] 06/08/2018, 10:41 AM  [facility] [address] [place], [place], [address] Phone: [phone]   Fax:  [phone]   Name: Zorian Gunderman MRN: [id number] Date of Birth: 1941/09/17

## 2018-06-09 ENCOUNTER — Encounter: Payer: Self-pay | Admitting: Speech Pathology

## 2018-06-09 ENCOUNTER — Ambulatory Visit: Payer: PPO | Admitting: Speech Pathology

## 2018-06-09 DIAGNOSIS — R471 Dysarthria and anarthria: Secondary | ICD-10-CM

## 2018-06-09 DIAGNOSIS — M6281 Muscle weakness (generalized): Secondary | ICD-10-CM | POA: Diagnosis not present

## 2018-06-09 DIAGNOSIS — R1312 Dysphagia, oropharyngeal phase: Secondary | ICD-10-CM

## 2018-06-09 NOTE — Patient Instructions (Signed)
Signs of Aspiration Pneumonia   . Chest pain/tightness . Fever (can be low grade) . Cough  o With foul-smelling phlegm (sputum) o With sputum containing pus or blood o With greenish sputum . Fatigue  . Shortness of breath  . Wheezing   **IF YOU HAVE THESE SIGNS, CONTACT YOUR DOCTOR OR GO TO THE EMERGENCY DEPARTMENT OR URGENT CARE AS SOON AS POSSIBLE**      

## 2018-06-09 NOTE — Therapy (Signed)
[facility] [address] [place], [place], [address] Phone: [phone]   Fax:  [phone]  Speech Language Pathology Treatment  Patient Details  Name: Christopher Atkinson MRN: [id number] Date of Birth: Mar 06, 1941 Referring Provider: Alonza Bogus, DO   Encounter Date: 06/09/2018  End of Session - 06/09/18 1213    Visit Number  9    Number of Visits  17    Date for SLP Re-Evaluation  07/17/18    SLP Start Time  1104    SLP Stop Time   9924    SLP Time Calculation (min)  40 min    Activity Tolerance  Patient tolerated treatment well;No increased pain       Past Medical History:  Diagnosis Date  . Aortic insufficiency   . Headache   . Obstructive sleep apnea    on CPAP  . Thoracic aortic aneurysm [facility])     Past Surgical History:  Procedure Laterality Date  . BACK SURGERY    . CATARACT EXTRACTION, BILATERAL    . CHOLECYSTECTOMY    . HERNIA REPAIR    . HERNIA REPAIR    . HERNIA REPAIR    . NOSE SURGERY      There were no vitals filed for this visit.  Subjective Assessment - 06/09/18 1118    Subjective  "He's louder sometimes, but not all the time"    Patient is accompained by:  Family member Lib spoue    Currently in Pain?  No/denies            ADULT SLP TREATMENT - 06/09/18 1121      General Information   Behavior/Cognition  Alert;Cooperative;Pleasant mood      Treatment Provided   Treatment provided  Cognitive-Linquistic      Dysphagia Treatment   Temperature Spikes Noted  No    Respiratory Status  Room air    Oral Cavity - Dentition  Adequate natural dentition;Missing dentition    Patient observed directly with PO's  Yes    Type of PO's observed  Thin liquids    Feeding  Needs set up    Liquids provided via  Cup    Amount of cueing  Minimal    Other treatment/comments  Pt reports taking meds with applesauce since last session with success.  Pt followed swallow precautions with min verbal  and written cues. Spouse reports pt inconsistently follows her cues while eating, due to cognitive impairments. Educated them to eliminate distractions with meals. Pt demonstrate accurate completion of HEP today with rare min A      Pain Assessment   Pain Assessment  No/denies pain      Cognitive-Linquistic Treatment   Treatment focused on  Dysarthria    Skilled Treatment  Loud /a/ to recalibrate volume average of 88dB with supervision cues. Structured speech task with minimal cognitive load average 70dB, with increased cognitive load, volume reduced to 67dB average with usual min visual and verbal cues for volume. In simple conversation of 3 turns, pt averaged 68dB.       Assessment / Recommendations / Plan   Plan  Continue with current plan of care      Dysphagia Recommendations   Diet recommendations  Dysphagia 3 (mechanical soft);Thin liquid    Liquids provided via  Cup    Medication Administration  Whole meds with puree    Supervision  Full supervision/cueing for compensatory strategies    Compensations  Effortful swallow;Clear throat intermittently;Multiple dry swallows after each  bite/sip;Small sips/bites;Slow rate    Postural Changes and/or Swallow Maneuvers  Seated upright 90 degrees      Progression Toward Goals   Progression toward goals  Progressing toward goals       SLP Education - 06/09/18 1156    Education provided  Yes    Education Details  s/s aspiration pna    Person(s) Educated  Patient;Spouse    Methods  Explanation;Handout    Comprehension  Verbalized understanding       SLP Short Term Goals - 06/09/18 1212      SLP SHORT TERM GOAL #1   Title  pt will follow swallow precautions in 80% of opportunities during session with appropriate POs    Time  1    Status  Not Met      SLP SHORT TERM GOAL #2   Title  pt will demo swallowing HEP with occasional min A over two sessions    Status  Not Met      SLP SHORT TERM GOAL #3   Title  pt will produce /a/ with  average upper 80s over 4 sessions    Baseline  06-04-18    Period  Weeks    Status  Partially Met      SLP SHORT TERM GOAL #4   Title  pt will participate in 3 minutes simple conversation with average volume 69 dB over two sessions    Time  1    Period  Weeks    Status  Partially Met      SLP SHORT TERM GOAL #5   Title  pt will answer simple questions with average 70dB in two therapy sessions    Baseline  06-04-18    Status  Partially Met       SLP Long Term Goals - 06/09/18 1212      SLP LONG TERM GOAL #1   Title  pt will follow swallow precautions >/= 90% of the time with written cues    Time  4    Period  Weeks    Status  On-going      SLP LONG TERM GOAL #2   Title  pt will produce loud /a/ average upper 80s over 6 sessions    Baseline  06/09/18;    Time  4    Period  Weeks    Status  On-going      SLP LONG TERM GOAL #3   Title  pt will participate in 5+ minutes simple conversation with average volume in low 70s dB with occasional nonverbal cues    Time  4    Period  Weeks    Status  On-going      SLP LONG TERM GOAL #4   Title  pt/family will tell 3 overt s/s aspiration PNA    Time  4    Status  On-going       Plan - 06/09/18 1213    Clinical Impression Statement  SLP worked with pt today with loudness in simple responses and conversation. Education provided re: swallow precautions, particularly taking medications with puree as recommended per MBS. Pt continues to present with sub normal volume affecting intellgilbity, and inconsistent carryover of swallow precauations. Continue skilled ST to maximize intellgilbility and safety of swallow.       Patient will benefit from skilled therapeutic intervention in order to improve the following deficits and impairments:   Dysphagia, oropharyngeal phase  Dysarthria and anarthria    Problem List Patient Active Problem  List   Diagnosis Date Noted  . Parkinsonism ([place]) 07/30/2017  . Dementia with behavioral  disturbance 04/30/2016  . Agitation 11/18/2014  . Complicated migraine 82/88/3374  . Cognitive and behavioral changes 11/05/2014  . Altered mental status   . History of migraine headaches   . Acute CVA (cerebrovascular accident) ([place]) 10/27/2014  . Aphasia   . Thoracic aortic aneurysm ([place]) 11/04/2013  . Aortic insufficiency 11/04/2013  . Obstructive sleep apnea 11/04/2013    [place] Leinen, Annye Rusk MS, CCC-SLP 06/09/2018, 12:14 PM  [facility] [address] [place], [place], [address] Phone: [phone]   Fax:  [phone]   Name: Christopher Atkinson MRN: [id number] Date of Birth: [birth year]

## 2018-06-12 ENCOUNTER — Ambulatory Visit: Payer: PPO | Admitting: Physical Therapy

## 2018-06-12 ENCOUNTER — Ambulatory Visit: Payer: PPO | Admitting: Occupational Therapy

## 2018-06-12 ENCOUNTER — Encounter: Payer: Self-pay | Admitting: Physical Therapy

## 2018-06-12 DIAGNOSIS — R29898 Other symptoms and signs involving the musculoskeletal system: Secondary | ICD-10-CM

## 2018-06-12 DIAGNOSIS — R293 Abnormal posture: Secondary | ICD-10-CM

## 2018-06-12 DIAGNOSIS — R2689 Other abnormalities of gait and mobility: Secondary | ICD-10-CM

## 2018-06-12 DIAGNOSIS — R278 Other lack of coordination: Secondary | ICD-10-CM

## 2018-06-12 DIAGNOSIS — R29818 Other symptoms and signs involving the nervous system: Secondary | ICD-10-CM

## 2018-06-12 DIAGNOSIS — M6281 Muscle weakness (generalized): Secondary | ICD-10-CM | POA: Diagnosis not present

## 2018-06-12 DIAGNOSIS — R41844 Frontal lobe and executive function deficit: Secondary | ICD-10-CM

## 2018-06-12 NOTE — Therapy (Signed)
[facility] [address] [place], [place], [address] Phone: [phone]   Fax:  [phone]  Physical Therapy Treatment  Patient Details  Name: Christopher Atkinson MRN: [id number] Date of Birth: [birth year] Referring Provider: Dr. Carles Collet    Encounter Date: 06/12/2018  PT End of Session - 06/12/18 1934    Visit Number  11    Number of Visits  17    Date for PT Re-Evaluation  07/30/18    Authorization Type  [employer]    PT Start Time  0935    PT Stop Time  1015    PT Time Calculation (min)  40 min    Activity Tolerance  Patient tolerated treatment well;No increased pain    Behavior During Therapy  WFL for tasks assessed/performed       Past Medical History:  Diagnosis Date  . Aortic insufficiency   . Headache   . Obstructive sleep apnea    on CPAP  . Thoracic aortic aneurysm [facility])     Past Surgical History:  Procedure Laterality Date  . BACK SURGERY    . CATARACT EXTRACTION, BILATERAL    . CHOLECYSTECTOMY    . HERNIA REPAIR    . HERNIA REPAIR    . HERNIA REPAIR    . NOSE SURGERY      There were no vitals filed for this visit.  Subjective Assessment - 06/12/18 0939    Subjective  Per wife-he's seeming better today.  I've been having him do the hamstring stretch    Patient is accompained by:  Family member wife-Christopher Atkinson    Pertinent History  PD, dementia, migraine, CVA, aphasia, OSA    Patient Stated Goals  Pt's goals for therapy are to be able to walk better and stand straighter.    Currently in Pain?  No/denies                       [facility] Adult PT Treatment/Exercise - 06/12/18 0001      Ambulation/Gait   Ambulation/Gait  Yes    Ambulation/Gait Assistance  5: Supervision    Ambulation/Gait Assistance Details  Initial and occasional verbal cues for "shoulders back" for upright posture with gait.  Cues for foot clearance    Ambulation Distance (Feet)  240 Feet x 3, 100 ft    Assistive  device  None    Gait Pattern  Step-through pattern;Decreased arm swing - right;Decreased arm swing - left;Decreased step length - right;Decreased dorsiflexion - right;Shuffle;Decreased trunk rotation;Trunk flexed;Decreased step length - left;Decreased dorsiflexion - left;Narrow base of support    Gait Comments  Improved attention to posture and gait today.      Self-Care   Self-Care  Other Self-Care Comments    Other Self-Care Comments   Provided patient and wife with information on [place] Parkinson's Disease Symposium, caregiver support group, [employer] over [employer] group, and resource information-drumming class, Psychologist, educational, [employer]; Aware in Care kit      Knee/Hip Exercises: Stretches   Active Hamstring Stretch  Right;Left;1 rep;30 seconds    Other Knee/Hip Stretches  SEated gastroc stretc with belt, 3 x 30 seconds        [facility]) - 06/12/18 0948    PWR! exercises  Moves in sitting;Moves in standing;Functional moves    PWR! Up  2 sets x 10 reps with visual cues    Comments  PWR! Up in standing for posture    PWR! Sit  to Stand  x 10 reps with PWR! Up posture upon standing    PWR! Up  x 20 reps    Comments  PWR! Up in sitting with visual, verbal cues for posture          PT Education - 06/12/18 1933    Education provided  Yes    Education Details  PD resources-see self care notes (PD symposium, caregiver support group, Power over Parkinson's group)    Person(s) Educated  Patient;Spouse    Methods  Explanation;Demonstration;Handout    Comprehension  Verbalized understanding       PT Short Term Goals - 05/29/18 1427      PT SHORT TERM GOAL #1   Title  Pt Christopher perform HEP with wife's supervision for improved functional mobility and balance.  TARGET 05/29/18    Time  4    Period  Weeks    Status  Partially Met      PT SHORT TERM GOAL #2   Title  Pt Christopher improve 5x sit<>stand to less than or equal to 17 seconds for improved  efficiency and safety with gait.    Time  4    Period  Weeks    Status  Achieved      PT SHORT TERM GOAL #3   Title  Pt Christopher improve TUG score to less than or equal to 20 seconds for decreased fall risk.    Time  4    Period  Weeks    Status  Achieved      PT SHORT TERM GOAL #4   Title  Pt/wife Christopher verbalize understanding of tips to reduce freezing with gait and turns.    Time  4    Period  Weeks    Status  Achieved        PT Long Term Goals - 05/29/18 1427      PT LONG TERM GOAL #1   Title  Pt/wife Christopher verbalize understanding of fall prevention in home environment.  TARGET    Time  8    Period  Weeks    Status  New      PT LONG TERM GOAL #2   Title  Pt Christopher improve 5x sit<>stand to less than or equal to 14 seconds for decreased fall risk.    Time  8    Period  Weeks    Status  New      PT LONG TERM GOAL #3   Title  Pt Christopher improve TUG score to less than or equal to 15 seconds ofr decreased fall risk.    Time  8    Period  Weeks    Status  Achieved      PT LONG TERM GOAL #4   Title  Pt Christopher improve gait velocity to at least 2.62 ft/sec for improved gait efficiency and safety.    Time  8    Period  Weeks    Status  New      PT LONG TERM GOAL #5   Title  Pt/wife Christopher verbalize plans for ongoing community fitness upon d/c from PT.    Time  8    Period  Weeks    Status  New            Plan - 06/12/18 1935    Clinical Impression Statement  Improved overall gait and posture today.  Improved attention to posture exercises, though he does need continued verbal and visual cues.  Gait without  rollator today.  Provided paitent and wife with educational materials for PD resources, including PD symposium and support groups, for which wife was very Patent attorney.  Pt Christopher continue to benefit from skilled PT to address balance, posture, gait activities for improved functional mobility.    Rehab Potential  Good    PT Frequency  2x / week    PT Duration  8 weeks plus  eval    PT Treatment/Interventions  ADLs/Self Care Home Management;DME Instruction;Gait training;Stair training;Functional mobility training;Therapeutic activities;Therapeutic exercise;Balance training;Neuromuscular re-education;Patient/family education    PT Next Visit Plan  Next week appears to be last scheduled week-begin checking LTGs and discuss d/c.    Consulted and Agree with Plan of Care  Patient;Family member/caregiver    Family Member Consulted  wife       Patient Christopher benefit from skilled therapeutic intervention in order to improve the following deficits and impairments:  Abnormal gait, Decreased balance, Decreased mobility, Decreased safety awareness, Difficulty walking, Decreased strength, Postural dysfunction  Visit Diagnosis: Abnormal posture  Other abnormalities of gait and mobility     Problem List Patient Active Problem List   Diagnosis Date Noted  . Parkinsonism ([place]) 07/30/2017  . 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 Agitation 11/18/2014  . Complicated migraine 47/42/5956  . Cognitive and behavioral changes 11/05/2014  . Altered mental status   . History of migraine headaches   . Acute CVA (cerebrovascular accident) ([place]) 10/27/2014  . Aphasia   . Thoracic aortic aneurysm ([place]) 11/04/2013  . Aortic insufficiency 11/04/2013  . Obstructive sleep apnea 11/04/2013    Christopher Atkinson W. 06/12/2018, 7:40 PM Frazier Butt., PT  [facility] [address] [place], [place], [address] Phone: [phone]   Fax:  [phone]  Name: Christopher Atkinson MRN: [id number] Date of Birth: [birth year]  Progress Note Reporting Period 05/01/18 to 06/12/18  See note below for Objective Data and Assessment of Progress/Goals.  PT Short Term Goals - 05/29/18 1427      PT SHORT TERM GOAL #1   Title  Pt Christopher perform HEP with wife's supervision for improved functional mobility and balance.  TARGET 05/29/18    Time   4    Period  Weeks    Status  Partially Met      PT SHORT TERM GOAL #2   Title  Pt Christopher improve 5x sit<>stand to less than or equal to 17 seconds for improved efficiency and safety with gait.    Time  4    Period  Weeks    Status  Achieved      PT SHORT TERM GOAL #3   Title  Pt Christopher improve TUG score to less than or equal to 20 seconds for decreased fall risk.    Time  4    Period  Weeks    Status  Achieved      PT SHORT TERM GOAL #4   Title  Pt/wife Christopher verbalize understanding of tips to reduce freezing with gait and turns.    Time  4    Period  Weeks    Status  Achieved      Pt has met 3 of 4 STGs and appears on track towards LTGs.  Pt needs consistent reinforcement and cueing during therapy sessions, and wife is present for each session for education and cueing techniques.  Pt Christopher continue to benefit from skilled PT to work towards [place] for decreased fall risk and improved functional mobility.  Christopher Atkinson, PT  06/12/18 7:42 PM Phone: [phone] Fax: [phone]

## 2018-06-12 NOTE — Therapy (Signed)
[facility] [address] [place], [place], [address] Phone: [phone]   Fax:  [phone]  Occupational Therapy Treatment  Patient Details  Name: Christopher Atkinson MRN: [id number] Date of Birth: [birth year] Referring Provider: Dr. Carles Collet    Encounter Date: 06/12/2018  OT End of Session - 06/12/18 1113    Visit Number  10    Number of Visits  17    Date for OT Re-Evaluation  07/05/18    Authorization Type  Medicare    Authorization Time Period  2x week x 8 weeks with 12 week cert period    OT Start Time  1017    OT Stop Time  1102    OT Time Calculation (min)  45 min    Activity Tolerance  Patient tolerated treatment well    Behavior During Therapy  [facility] for tasks assessed/performed       Past Medical History:  Diagnosis Date  . Aortic insufficiency   . Headache   . Obstructive sleep apnea    on CPAP  . Thoracic aortic aneurysm [facility])     Past Surgical History:  Procedure Laterality Date  . BACK SURGERY    . CATARACT EXTRACTION, BILATERAL    . CHOLECYSTECTOMY    . HERNIA REPAIR    . HERNIA REPAIR    . HERNIA REPAIR    . NOSE SURGERY      There were no vitals filed for this visit.  Subjective Assessment - 06/12/18 1024    Subjective   Wife reports pt doing better today    Pertinent History  PMH: Parkinson's disease, CVA, migraines, thoracic aortic aneurysm, sleep apnea, migraines    Patient Stated Goals  To maintain his independence    Currently in Pain?  No/denies                   OT Treatments/Exercises (OP) - 06/12/18 0001      ADLs   UB Dressing  Practiced donning/doffing "jacket" x 3 w/ mod to max cueing for large movement strategies (prior to assessing STG #4)     LB Dressing  Pt shown stretch for bilateral LE hips/knees to do in the am prior to dressing    Functional Mobility  Ambulating with cues for posture and high visual target to automatically correct. Cueing for big steps    ADL  Comments  Assessed remaining STG's and progress to date. See goal section. Worked on posture during UE dressing and transitional movements      Neurological Re-education Exercises   Other Weight-Bearing Exercises 1  Dynamic step patterns and UE reaching w/ trunk rotation bilateral sides w/ mod to max cues for stepping back together and posture        P[facility]) - 06/12/18 0948    PWR! exercises  Moves in sitting;Moves in standing;Functional moves    PWR! Up  2 sets x 10 reps with visual cues    PWR! Sit to Stand  x 10 reps with PWR! Up posture upon standing    PWR! Up  x 20 reps            OT Short Term Goals - 06/12/18 1036      OT SHORT TERM GOAL #1   Title  I with HEP    Time  4    Period  Weeks    Status  Achieved      OT SHORT TERM GOAL #2   Title  Pt/ wife  will be I with adapted strategies for ADLs/IADLS    Time  4    Period  Weeks    Status  Partially Met Pt still needs reminders, however pt/wife aware of strategies      OT SHORT TERM GOAL #3   Title  Pt will demonstrate improved fine motor coordination for ADLS as evidenced by decreaseing bilteral 9 hole peg test score by 5 secs.    Baseline  RUE 60.28 secs, LUE 49.78 secs    Time  4    Period  Weeks    Status  Partially Met RUE 40 secs, LUE 51 sec. (met on RUE only)      OT SHORT TERM GOAL #4   Title  Pt will demonstrate improved ease with dressing as eveidenced by decreasing PPT#4 to 75 secs of less.    Baseline  1 min 23 secs    Time  4    Period  Weeks    Status  Achieved 48.00 sec      OT SHORT TERM GOAL #5   Title  Pt will demonstate ability to retrieve a lightweight object with RUE at 120 shoulder flexion.    Time  8    Period  Weeks    Status  Achieved      OT SHORT TERM GOAL #6   Title  Pt will demonstrate ability to retrieve a lightweight object at 120 shoulder flexion with -15 elbow extension with LUE.    Time  8    Period  Weeks    Status  Achieved        OT Long Term Goals -  05/06/18 1015      OT LONG TERM GOAL #1   Title  Pt will demonstrate improved ease with fastening buttons as evidenced by decreasing 3 button / unbutton to 70 secs or less.    Baseline  79 secs    Time  8    Period  Weeks    Status  New    Target Date  07/05/18      OT LONG TERM GOAL #2   Title  pt will demonstrate increased RUE functional use for ADLS as evidenced by increasing RUE box/ blocks score to 30 blocks    Baseline  RUE 26 blocks, LUE 32 blocks    Time  8    Period  Weeks    Status  New      OT LONG TERM GOAL #3   Title  Pt/ wife will verbalize understanding of community resources and ways to prevent future PD related complications.     Time  8    Period  Weeks    Status  New      OT LONG TERM GOAL #4   Title  Pt / wife will verbalize understanding of ways to keep thinking skills sharp and compensatory strategies for cognitive changes associated with PD.    Time  8    Period  Weeks    Status  New            Plan - 06/12/18 1053    Clinical Impression Statement  Pt met STG's #4, 5, and 6. Pt partially met STG's #2 and 3. Pt continues to make gradual progress however requires mod cueing/reinforcement d/t cognitive deficits.     Occupational Profile and client history currently impacting functional performance  PMH: Parkinson's disease, CVA, migraines, thoracic aortic aneurysm, sleep apnea, migraines    Occupational performance deficits (Please refer  to evaluation for details):  ADL's;IADL's;Education;Play;Leisure;Social Participation    Rehab Potential  Good    Current Impairments/barriers affecting progress:  cogntive deficits, abnormal posture, bradykinesia, abnormal posture    OT Frequency  2x / week    OT Duration  8 weeks    OT Treatment/Interventions  Self-care/ADL training;Therapeutic exercise;Visual/perceptual remediation/compensation;Patient/family education;Neuromuscular education;Paraffin;Moist Heat;Fluidtherapy;Energy conservation;Wellsite geologist;Therapeutic activities;Balance training;Cognitive remediation/compensation;Passive range of motion;Manual Therapy;DME and/or AE instruction;Ultrasound;Cryotherapy    Plan  continue to reinforce ADLS w/ big movement strategies, upright posture with standing/walking, continue functional reaching with trunk rotation and stepping patterns    Consulted and Agree with Plan of Care  Patient;Family member/caregiver    Family Member Consulted  wife       Patient will benefit from skilled therapeutic intervention in order to improve the following deficits and impairments:  Abnormal gait, Decreased cognition, Impaired flexibility, Decreased mobility, Decreased coordination, Decreased activity tolerance, Decreased endurance, Decreased range of motion, Decreased strength, Impaired UE functional use, Impaired perceived functional ability, Difficulty walking, Decreased safety awareness, Decreased knowledge of precautions, Decreased balance  Visit Diagnosis: Abnormal posture  Other symptoms and signs involving the nervous system  Other symptoms and signs involving the musculoskeletal system  Other lack of coordination  Frontal lobe and executive function deficit    Problem List Patient Active Problem List   Diagnosis Date Noted  . Parkinsonism ([place]) 07/30/2017  . 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 Agitation 11/18/2014  . Complicated migraine 19/12/2222  . Cognitive and behavioral changes 11/05/2014  . Altered mental status   . History of migraine headaches   . Acute CVA (cerebrovascular accident) ([place]) 10/27/2014  . Aphasia   . Thoracic aortic aneurysm ([place]) 11/04/2013  . Aortic insufficiency 11/04/2013  . Obstructive sleep apnea 11/04/2013    Carey Bullocks, OTR/L 06/12/2018, 11:14 AM  [facility] [address] [place], [place], [address] Phone: [phone]   Fax:  [phone]  Name: Yuta Cipollone MRN: [id number] Date of Birth: Jan 11, 1941

## 2018-06-15 ENCOUNTER — Ambulatory Visit: Payer: PPO | Admitting: Speech Pathology

## 2018-06-15 DIAGNOSIS — M6281 Muscle weakness (generalized): Secondary | ICD-10-CM | POA: Diagnosis not present

## 2018-06-15 DIAGNOSIS — R471 Dysarthria and anarthria: Secondary | ICD-10-CM

## 2018-06-15 DIAGNOSIS — R1312 Dysphagia, oropharyngeal phase: Secondary | ICD-10-CM

## 2018-06-15 NOTE — Therapy (Signed)
[facility] [address] [place], [place], [address] Phone: [phone]   Fax:  [phone]  Speech Language Pathology Treatment  Patient Details  Name: Christopher Atkinson MRN: [id number] Date of Birth: 08/04/1941 Referring Provider: Alonza Bogus, DO   Encounter Date: 06/15/2018   Speech Therapy Progress Note  Dates of Reporting Period: 05/04/18 to 06/15/18  End of Session - 06/15/18 1720    Visit Number  10    Number of Visits  17    Date for SLP Re-Evaluation  07/17/18    SLP Start Time  8115    SLP Stop Time   1700    SLP Time Calculation (min)  43 min    Activity Tolerance  Patient tolerated treatment well       Past Medical History:  Diagnosis Date  . Aortic insufficiency   . Headache   . Obstructive sleep apnea    on CPAP  . Thoracic aortic aneurysm [facility])     Past Surgical History:  Procedure Laterality Date  . BACK SURGERY    . CATARACT EXTRACTION, BILATERAL    . CHOLECYSTECTOMY    . HERNIA REPAIR    . HERNIA REPAIR    . HERNIA REPAIR    . NOSE SURGERY      There were no vitals filed for this visit.  Subjective Assessment - 06/15/18 1622    Subjective  "Fair to [place]" (sub WNL)    Patient is accompained by:  Family member    Currently in Pain?  No/denies            ADULT SLP TREATMENT - 06/15/18 1620      General Information   Behavior/Cognition  Alert;Cooperative;Pleasant mood    Patient Positioning  Upright in chair      Treatment Provided   Treatment provided  Cognitive-Linquistic      Pain Assessment   Pain Assessment  No/denies pain      Cognitive-Linquistic Treatment   Treatment focused on  Dysarthria    Skilled Treatment  Loud /a/ to recalibrate volume average of 89dB with supervision cues. Structured speech task with minimal cognitive load average 72dB, with increased cognitive load, volume averaged in upper 60s dB with usual min visual and verbal cues for volume. Usual  min-mod A and demonstration required in simple conversation.       Assessment / Recommendations / Plan   Plan  Continue with current plan of care      Progression Toward Goals   Progression toward goals  Progressing toward goals         SLP Short Term Goals - 06/15/18 1624      SLP SHORT TERM GOAL #1   Title  pt will follow swallow precautions in 80% of opportunities during session with appropriate POs    Status  Not Met      SLP SHORT TERM GOAL #2   Title  pt will demo swallowing HEP with occasional min A over two sessions    Status  Not Met      SLP SHORT TERM GOAL #3   Title  pt will produce /a/ with average upper 80s over 4 sessions    Status  Partially Met      SLP SHORT TERM GOAL #4   Title  pt will participate in 3 minutes simple conversation with average volume 69 dB over two sessions    Status  Partially Met      SLP SHORT TERM GOAL #  5   Title  pt will answer simple questions with average 70dB in two therapy sessions    Status  Partially Met       SLP Long Term Goals - 06/15/18 1624      SLP LONG TERM GOAL #1   Title  pt will follow swallow precautions >/= 90% of the time with written cues    Time  3    Period  Weeks    Status  On-going      SLP LONG TERM GOAL #2   Title  pt will produce loud /a/ average upper 80s over 6 sessions    Baseline  06/09/18; 06/15/18    Time  3    Period  Weeks    Status  On-going      SLP LONG TERM GOAL #3   Title  pt will participate in 5+ minutes simple conversation with average volume in low 70s dB with occasional nonverbal cues    Time  3    Period  Weeks    Status  On-going      SLP LONG TERM GOAL #4   Title  pt/family will tell 3 overt s/s aspiration PNA    Time  3    Period  Weeks    Status  On-going       Plan - 06/15/18 1718    Clinical Impression Statement  SLP worked with pt today with decreased loudness in simple responses and conversation. Education provided re: swallow precautions, particularly taking  medications with puree as recommended per MBS. Pt continues to present with sub normal volume affecting intellgilbity, and inconsistent carryover of swallow precauations. Continue skilled ST to maximize intelligibility and safety of swallow.    Speech Therapy Frequency  2x / week    Treatment/Interventions  Aspiration precaution training;Pharyngeal strengthening exercises;Diet toleration management by SLP;Trials of upgraded texture/liquids;Internal/external aids;Multimodal communcation approach;Patient/family education;Compensatory strategies;SLP instruction and feedback;Cueing hierarchy;Environmental controls;Functional tasks    Potential to Achieve Goals  Fair    Potential Considerations  Severity of impairments;Ability to learn/carryover information    Consulted and Agree with Plan of Care  Patient;Family member/caregiver    Family Member Consulted  spouse Lib       Patient will benefit from skilled therapeutic intervention in order to improve the following deficits and impairments:   Dysarthria and anarthria  Dysphagia, oropharyngeal phase    Problem List Patient Active Problem List   Diagnosis Date Noted  . Parkinsonism ([place]) 07/30/2017  . 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 Agitation 11/18/2014  . Complicated migraine 77/93/9030  . Cognitive and behavioral changes 11/05/2014  . Altered mental status   . History of migraine headaches   . Acute CVA (cerebrovascular accident) ([place]) 10/27/2014  . Aphasia   . Thoracic aortic aneurysm ([place]) 11/04/2013  . Aortic insufficiency 11/04/2013  . Obstructive sleep apnea 11/04/2013   Deneise Lever, [place], [place] 06/15/2018, 5:23 PM  [facility] [address] [place], [place], [address] Phone: [phone]   Fax:  [phone]   Name: Jairon Ripberger MRN: [id number] Date of Birth: 07-08-1941

## 2018-06-16 ENCOUNTER — Ambulatory Visit: Payer: PPO | Admitting: Speech Pathology

## 2018-06-16 ENCOUNTER — Encounter: Payer: Self-pay | Admitting: Speech Pathology

## 2018-06-16 ENCOUNTER — Ambulatory Visit: Payer: PPO | Admitting: Occupational Therapy

## 2018-06-16 ENCOUNTER — Ambulatory Visit: Payer: PPO | Admitting: Physical Therapy

## 2018-06-16 ENCOUNTER — Encounter: Payer: Self-pay | Admitting: Physical Therapy

## 2018-06-16 DIAGNOSIS — R278 Other lack of coordination: Secondary | ICD-10-CM

## 2018-06-16 DIAGNOSIS — R293 Abnormal posture: Secondary | ICD-10-CM

## 2018-06-16 DIAGNOSIS — R29898 Other symptoms and signs involving the musculoskeletal system: Secondary | ICD-10-CM

## 2018-06-16 DIAGNOSIS — R1312 Dysphagia, oropharyngeal phase: Secondary | ICD-10-CM

## 2018-06-16 DIAGNOSIS — M6281 Muscle weakness (generalized): Secondary | ICD-10-CM | POA: Diagnosis not present

## 2018-06-16 DIAGNOSIS — R2681 Unsteadiness on feet: Secondary | ICD-10-CM

## 2018-06-16 DIAGNOSIS — R2689 Other abnormalities of gait and mobility: Secondary | ICD-10-CM

## 2018-06-16 DIAGNOSIS — R471 Dysarthria and anarthria: Secondary | ICD-10-CM

## 2018-06-16 DIAGNOSIS — R29818 Other symptoms and signs involving the nervous system: Secondary | ICD-10-CM

## 2018-06-16 DIAGNOSIS — R41844 Frontal lobe and executive function deficit: Secondary | ICD-10-CM

## 2018-06-16 NOTE — Therapy (Signed)
[facility] [address] [place], [place], [address] Phone: [phone]   Fax:  [phone]  Physical Therapy Treatment  Patient Details  Name: Christopher Atkinson MRN: [id number] Date of Birth: December 26, 1940 Referring Provider: Dr. Carles Collet    Encounter Date: 06/16/2018  PT End of Session - 06/16/18 1102    Visit Number  12    Number of Visits  17    Date for PT Re-Evaluation  07/30/18    Authorization Type  [employer]    PT Start Time  0934    PT Stop Time  1015    PT Time Calculation (min)  41 min    Equipment Utilized During Treatment  Gait belt    Activity Tolerance  Patient tolerated treatment well;No increased pain    Behavior During Therapy  WFL for tasks assessed/performed       Past Medical History:  Diagnosis Date  . Aortic insufficiency   . Headache   . Obstructive sleep apnea    on CPAP  . Thoracic aortic aneurysm [facility])     Past Surgical History:  Procedure Laterality Date  . BACK SURGERY    . CATARACT EXTRACTION, BILATERAL    . CHOLECYSTECTOMY    . HERNIA REPAIR    . HERNIA REPAIR    . HERNIA REPAIR    . NOSE SURGERY      There were no vitals filed for this visit.  Subjective Assessment - 06/16/18 0937    Subjective  No changes since last week.  Wife says he is doing better.  Does his walking in the house when he's fresher and moving better.  He does his exercises through the day.    Patient is accompained by:  Family member wife-[name]    Pertinent History  PD, dementia, migraine, CVA, aphasia, OSA    Patient Stated Goals  Pt's goals for therapy are to be able to walk better and stand straighter.    Currently in Pain?  No/denies                       [facility] Adult PT Treatment/Exercise - 06/16/18 0001      Ambulation/Gait   Ambulation/Gait  Yes    Ambulation/Gait Assistance  5: Supervision    Ambulation Distance (Feet)  240 Feet 50 ft x 6 reps with turns    Assistive device   None    Gait Pattern  Step-through pattern;Decreased arm swing - right;Decreased arm swing - left;Decreased step length - right;Decreased dorsiflexion - right;Shuffle;Decreased trunk rotation;Trunk flexed;Decreased step length - left;Decreased dorsiflexion - left;Narrow base of support    Stairs  Yes    Stairs Assistance  5: Supervision;4: Min guard    Stairs Assistance Details (indicate cue type and reason)  Cues for full foot placement and foot clearance on steps    Stair Management Technique  One rail Left;Alternating pattern;Forwards    Number of Stairs  4 x 4 reps    Ramp  5: Supervision    Ramp Details (indicate cue type and reason)  Cues for posture and foot clearance    Curb  5: Supervision        PWR [facility]) - 06/16/18 0945    PWR! exercises  Moves in sitting;Moves in standing;Functional moves    PWR! Up  2 sets x 10 reps     Comments  PWR! Up in standing for upright posture visual and verbal cues for technique and amplitude  PWR! Sit to Stand  x 10 reps with PWR! UP posture upon standing visual and verbal cues    PWR! Up  x 20 reps    Comments  PWR! Up in sitting for posture-requires visual and verbal cues       Balance Exercises - 06/16/18 0951      Balance Exercises: Standing   Stepping Strategy  Posterior;UE support;10 reps    Heel Raises Limitations  2 sets x 10    Toe Raise Limitations  2 sets x 10    Other Standing Exercises  Standing at parallel bars: Push into bars, then to stand in upright PWR! Up position, x 10 reps          PT Short Term Goals - 05/29/18 1427      PT SHORT TERM GOAL #1   Title  Pt will perform HEP with wife's supervision for improved functional mobility and balance.  TARGET 05/29/18    Time  4    Period  Weeks    Status  Partially Met      PT SHORT TERM GOAL #2   Title  Pt will improve 5x sit<>stand to less than or equal to 17 seconds for improved efficiency and safety with gait.    Time  4    Period  Weeks    Status  Achieved       PT SHORT TERM GOAL #3   Title  Pt will improve TUG score to less than or equal to 20 seconds for decreased fall risk.    Time  4    Period  Weeks    Status  Achieved      PT SHORT TERM GOAL #4   Title  Pt/wife will verbalize understanding of tips to reduce freezing with gait and turns.    Time  4    Period  Weeks    Status  Achieved        PT Long Term Goals - 05/29/18 1427      PT LONG TERM GOAL #1   Title  Pt/wife will verbalize understanding of fall prevention in home environment.  TARGET    Time  8    Period  Weeks    Status  New      PT LONG TERM GOAL #2   Title  Pt will improve 5x sit<>stand to less than or equal to 14 seconds for decreased fall risk.    Time  8    Period  Weeks    Status  New      PT LONG TERM GOAL #3   Title  Pt will improve TUG score to less than or equal to 15 seconds ofr decreased fall risk.    Time  8    Period  Weeks    Status  Achieved      PT LONG TERM GOAL #4   Title  Pt will improve gait velocity to at least 2.62 ft/sec for improved gait efficiency and safety.    Time  8    Period  Weeks    Status  New      PT LONG TERM GOAL #5   Title  Pt/wife will verbalize plans for ongoing community fitness upon d/c from PT.    Time  8    Period  Weeks    Status  New            Plan - 06/16/18 1103    Clinical Impression Statement  SKilled PT  sesion today again focused on posture and gait, including stairs, ramp, and curb negotiation.  Pt needs cues for slowed timing and foot clearance with turns, to prevent festinating.  Discussed POC, with pt/wife planning for discharge next visit.    Rehab Potential  Good    PT Frequency  2x / week    PT Duration  8 weeks plus eval    PT Treatment/Interventions  ADLs/Self Care Home Management;DME Instruction;Gait training;Stair training;Functional mobility training;Therapeutic activities;Therapeutic exercise;Balance training;Neuromuscular re-education;Patient/family education    PT Next Visit Plan   Check LTGs and discuss continued community fitness; likely a return eval in 6 months.    Consulted and Agree with Plan of Care  Patient;Family member/caregiver    Family Member Consulted  wife       Patient will benefit from skilled therapeutic intervention in order to improve the following deficits and impairments:  Abnormal gait, Decreased balance, Decreased mobility, Decreased safety awareness, Difficulty walking, Decreased strength, Postural dysfunction  Visit Diagnosis: Abnormal posture  Unsteadiness on feet  Other abnormalities of gait and mobility     Problem List Patient Active Problem List   Diagnosis Date Noted  . Parkinsonism ([place]) 07/30/2017  . 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 Agitation 11/18/2014  . Complicated migraine 02/14/3611  . Cognitive and behavioral changes 11/05/2014  . Altered mental status   . History of migraine headaches   . Acute CVA (cerebrovascular accident) ([place]) 10/27/2014  . Aphasia   . Thoracic aortic aneurysm ([place]) 11/04/2013  . Aortic insufficiency 11/04/2013  . Obstructive sleep apnea 11/04/2013    Davontay Watlington W. 06/16/2018, 12:32 PM  Frazier Butt., PT   [facility] [address] [place], [place], [address] Phone: [phone]   Fax:  [phone]  Name: Christopher Atkinson MRN: [id number] Date of Birth: 1941/09/28

## 2018-06-16 NOTE — Patient Instructions (Addendum)
Memory Compensation Strategies  1. Use "WARM" strategy.  W= write it down  A= associate it  R= repeat it  M= make a mental note  2.   You can keep a Social worker.  Use a 3-ring notebook with sections for the following: calendar, important names and phone numbers,  medications, doctors' names/phone numbers, lists/reminders, and a section to journal what you did  each day.   3.    Use a calendar to write appointments down.  4.    Write yourself a schedule for the day.  This can be placed on the calendar or in a separate section of the Memory Notebook.  Keeping a  regular schedule can help memory.  5.    Use medication organizer with sections for each day or morning/evening pills.  You may need help loading it  6.    Keep a basket, or pegboard by the door.  Place items that you need to take out with you in the basket or on the pegboard.  You may also want to  include a message board for reminders.  7.    Use sticky notes.  Place sticky notes with reminders in a place where the task is performed.  For example: " turn off the  stove" placed by the stove, "lock the door" placed on the door at eye level, " take your medications" on  the bathroom mirror or by the place where you normally take your medications.  8.    Use alarms/timers.  Use while cooking to remind yourself to check on food or as a reminder to take your medicine, or as a  reminder to make a call, or as a reminder to perform another task, etc.       Keeping Thinking Skills Sharp: 1. Jigsaw puzzles 2. Card/board games 3. Talking on the phone/social events 4. [employer] 5. Online games 6. Word serches/crossword puzzles 7.  Logic puzzles 8. Aerobic exercise (stationary bike) 9. Eating balanced diet (fruits & veggies) 10. Drink water 11. Try something new--new recipe, hobby 12. Crafts 13. Do a variety of activities that are challenging  \                    Ways to prevent future Parkinson's  related complications: 1.   Exercise regularly,  2.   Focus on BIGGER movements during daily activities- really reach overhead, straighten elbows and extend fingers 3.   When dressing or reaching for your seatbelt make sure to use your body to assist by twisting while you reach-this can help to minimize stress on the shoulder and reduce the risk of a rotator cuff tear 4.   Swing your arms when you walk! People with PD are at increased risk for frozen shoulder and swinging your arms can reduce this risk.

## 2018-06-16 NOTE — Therapy (Signed)
[facility] [address] [place], [place], [address] Phone: [phone]   Fax:  [phone]  Speech Language Pathology Treatment  Patient Details  Name: Christopher Atkinson MRN: [id number] Date of Birth: September 02, 1941 Referring Provider: Alonza Bogus, DO   Encounter Date: 06/16/2018  End of Session - 06/16/18 1536    Visit Number  11    Number of Visits  17    Date for SLP Re-Evaluation  07/17/18    SLP Start Time  8270    SLP Stop Time   1314    SLP Time Calculation (min)  43 min    Activity Tolerance  Patient tolerated treatment well;No increased pain       Past Medical History:  Diagnosis Date  . Aortic insufficiency   . Headache   . Obstructive sleep apnea    on CPAP  . Thoracic aortic aneurysm [facility])     Past Surgical History:  Procedure Laterality Date  . BACK SURGERY    . CATARACT EXTRACTION, BILATERAL    . CHOLECYSTECTOMY    . HERNIA REPAIR    . HERNIA REPAIR    . HERNIA REPAIR    . NOSE SURGERY      There were no vitals filed for this visit.  Subjective Assessment - 06/16/18 1236    Subjective  "Cookin with Kerosene" low volume    Patient is accompained by:  Family member    Currently in Pain?  No/denies            ADULT SLP TREATMENT - 06/16/18 1242      General Information   Behavior/Cognition  Alert;Cooperative;Pleasant mood      Treatment Provided   Treatment provided  Cognitive-Linquistic      Pain Assessment   Pain Assessment  No/denies pain      Cognitive-Linquistic Treatment   Treatment focused on  Dysarthria    Skilled Treatment  Loud /a/ to recalibrate volume with average of 89dB. Generating simple sentences with 2 given words with average of 73dB with rare min A. Similairities/differences (incrased cognitive load) average of 68dB with usual min A for volume. Simple conversation over 5 minutes averaged 68dB with min A for volume. Verbal and visual cues. Spouse and pt report pt  completing swallowing exercises daily at home.       Assessment / Recommendations / Plan   Plan  Continue with current plan of care      Dysphagia Recommendations   Diet recommendations  Dysphagia 3 (mechanical soft);Thin liquid    Liquids provided via  Cup    Medication Administration  Whole meds with puree    Supervision  Full supervision/cueing for compensatory strategies    Compensations  Effortful swallow;Clear throat intermittently;Multiple dry swallows after each bite/sip;Small sips/bites;Slow rate    Postural Changes and/or Swallow Maneuvers  Seated upright 90 degrees      Progression Toward Goals   Progression toward goals  Progressing toward goals         SLP Short Term Goals - 06/16/18 1533      SLP SHORT TERM GOAL #1   Title  pt will follow swallow precautions in 80% of opportunities during session with appropriate POs    Status  Not Met      SLP SHORT TERM GOAL #2   Title  pt will demo swallowing HEP with occasional min A over two sessions    Status  Not Met      SLP SHORT TERM GOAL #  3   Title  pt will produce /a/ with average upper 80s over 4 sessions    Status  Partially Met      SLP SHORT TERM GOAL #4   Title  pt will participate in 3 minutes simple conversation with average volume 69 dB over two sessions    Status  Partially Met      SLP SHORT TERM GOAL #5   Title  pt will answer simple questions with average 70dB in two therapy sessions    Status  Partially Met       SLP Long Term Goals - 06/16/18 1536      SLP LONG TERM GOAL #1   Title  pt will follow swallow precautions >/= 90% of the time with written cues    Time  3    Period  Weeks    Status  On-going      SLP LONG TERM GOAL #2   Title  pt will produce loud /a/ average upper 80s over 6 sessions    Baseline  06/09/18; 06/15/18    Time  3    Period  Weeks    Status  On-going      SLP LONG TERM GOAL #3   Title  pt will participate in 5+ minutes simple conversation with average volume in  low 70s dB with occasional nonverbal cues    Time  3    Period  Weeks    Status  On-going      SLP LONG TERM GOAL #4   Title  pt/family will tell 3 overt s/s aspiration PNA    Time  3    Period  Weeks    Status  On-going       Plan - 06/16/18 1534    Clinical Impression Statement  Pt with some improvment in volume today in structured task and simple conversation. Pt verbalizes swallow precautions with questioning cues, however he will require ongoing cueing from sposue for volume and swallow precaution carryover.  Continue skilled ST to maximize safety of swallow and intelligilbity.    Treatment/Interventions  Aspiration precaution training;Pharyngeal strengthening exercises;Diet toleration management by SLP;Trials of upgraded texture/liquids;Internal/external aids;Multimodal communcation approach;Patient/family education;Compensatory strategies;SLP instruction and feedback;Cueing hierarchy;Environmental controls;Functional tasks    Potential to Achieve Goals  Fair    Potential Considerations  Severity of impairments;Ability to learn/carryover information    Consulted and Agree with Plan of Care  Patient;Family member/caregiver    Family Member Consulted  spouse Lib       Patient will benefit from skilled therapeutic intervention in order to improve the following deficits and impairments:   Dysarthria and anarthria  Dysphagia, oropharyngeal phase    Problem List Patient Active Problem List   Diagnosis Date Noted  . Parkinsonism ([place]) 07/30/2017  . 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 Agitation 11/18/2014  . Complicated migraine 12/45/8099  . Cognitive and behavioral changes 11/05/2014  . Altered mental status   . History of migraine headaches   . Acute CVA (cerebrovascular accident) ([place]) 10/27/2014  . Aphasia   . Thoracic aortic aneurysm ([place]) 11/04/2013  . Aortic insufficiency 11/04/2013  . Obstructive sleep apnea 11/04/2013    Tate Zagal, Annye Rusk MS,  CCC-SLP 06/16/2018, 3:37 PM  [facility] [address] [place], [place], [address] Phone: [phone]   Fax:  [phone]   Name: Azad Calame MRN: [id number] Date of Birth: 1941-05-20

## 2018-06-17 NOTE — Therapy (Signed)
[facility] [address] [place], [place], [address] Phone: [phone]   Fax:  [phone]  Occupational Therapy Treatment  Patient Details  Name: Christopher Atkinson MRN: [id number] Date of Birth: September 27, 1941 Referring Provider: Dr. Carles Collet    Encounter Date: 06/16/2018  OT End of Session - 06/17/18 0824    Visit Number  11    Number of Visits  17    Date for OT Re-Evaluation  07/05/18    Authorization Type  Medicare    Authorization Time Period  2x week x 8 weeks with 12 week cert period    OT Start Time  1020    OT Stop Time  1100    OT Time Calculation (min)  40 min    Activity Tolerance  Patient tolerated treatment well    Behavior During Therapy  [facility] for tasks assessed/performed       Past Medical History:  Diagnosis Date  . Aortic insufficiency   . Headache   . Obstructive sleep apnea    on CPAP  . Thoracic aortic aneurysm [facility])     Past Surgical History:  Procedure Laterality Date  . BACK SURGERY    . CATARACT EXTRACTION, BILATERAL    . CHOLECYSTECTOMY    . HERNIA REPAIR    . HERNIA REPAIR    . HERNIA REPAIR    . NOSE SURGERY      There were no vitals filed for this visit.  Subjective Assessment - 06/17/18 0832    Subjective   Pt reports no pain    Pertinent History  PMH: Parkinson's disease, CVA, migraines, thoracic aortic aneurysm, sleep apnea, migraines    Patient Stated Goals  To maintain his independence    Currently in Pain?  No/denies            Treatment: adapted strategies for eating, with pt simulating with improved performance. Fastening buttons using big movements, min v.c, PWR! Hands prior to task Therapist started checking progress towards goals.               OT Education - 06/17/18 0825    Education provided  Yes    Education Details  ways to prevent future PD complications, memory compensations, and keeping thinking skills sharp    Person(s) Educated   Patient;Spouse    Methods  Explanation;Verbal cues;Demonstration    Comprehension  Returned demonstration;Verbalized understanding;Verbal cues required       OT Short Term Goals - 06/16/18 1030      OT SHORT TERM GOAL #1   Title  I with HEP    Status  Achieved      OT SHORT TERM GOAL #2   Title  Pt/ wife will be I with adapted strategies for ADLs/IADLS    Status  Achieved      OT SHORT TERM GOAL #3   Title  Pt will demonstrate improved fine motor coordination for ADLS as evidenced by decreaseing bilteral 9 hole peg test score by 5 secs.    Status  Partially Met      OT SHORT TERM GOAL #4   Title  Pt will demonstrate improved ease with dressing as eveidenced by decreasing PPT#4 to 75 secs of less.    Status  Achieved      OT SHORT TERM GOAL #5   Title  Pt will demonstate ability to retrieve a lightweight object with RUE at 120 shoulder flexion.    Status  Achieved  OT SHORT TERM GOAL #6   Title  Pt will demonstrate ability to retrieve a lightweight object at 120 shoulder flexion with -15 elbow extension with LUE.    Status  Achieved        OT Long Term Goals - 06/16/18 1047      OT LONG TERM GOAL #1   Title  Pt will demonstrate improved ease with fastening buttons as evidenced by decreasing 3 button / unbutton to 70 secs or less.    Baseline  79 secs    Time  8    Period  Weeks    Status  Achieved 27.09 secs      OT LONG TERM GOAL #2   Title  pt will demonstrate increased RUE functional use for ADLS as evidenced by increasing RUE box/ blocks score to 30 blocks    Baseline  RUE 26 blocks, LUE 32 blocks    Time  8    Period  Weeks    Status  Achieved 37 blocks with RUE      OT LONG TERM GOAL #3   Title  Pt/ wife will verbalize understanding of community resources and ways to prevent future PD related complications.     Time  8    Period  Weeks    Status  On-going      OT LONG TERM GOAL #4   Title  Pt / wife will verbalize understanding of ways to keep  thinking skills sharp and compensatory strategies for cognitive changes associated with PD.    Time  8    Period  Weeks    Status  Achieved            Plan - 06/17/18 3875    Clinical Impression Statement  Pt is progressing to wards goals. Pt and wife agree with d/c next vsist.    Occupational performance deficits (Please refer to evaluation for details):  ADL's;IADL's;Education;Play;Leisure;Social Participation    Rehab Potential  Good    Current Impairments/barriers affecting progress:  cogntive deficits, abnormal posture, bradykinesia, abnormal posture    OT Frequency  2x / week    OT Duration  8 weeks    OT Treatment/Interventions  Self-care/ADL training;Therapeutic exercise;Visual/perceptual remediation/compensation;Patient/family education;Neuromuscular education;Paraffin;Moist Heat;Fluidtherapy;Energy conservation;Therapist, nutritional;Therapeutic activities;Balance training;Cognitive remediation/compensation;Passive range of motion;Manual Therapy;DME and/or AE instruction;Ultrasound;Cryotherapy    Plan  finish checking goals and d/c       Patient will benefit from skilled therapeutic intervention in order to improve the following deficits and impairments:  Abnormal gait, Decreased cognition, Impaired flexibility, Decreased mobility, Decreased coordination, Decreased activity tolerance, Decreased endurance, Decreased range of motion, Decreased strength, Impaired UE functional use, Impaired perceived functional ability, Difficulty walking, Decreased safety awareness, Decreased knowledge of precautions, Decreased balance  Visit Diagnosis: Other symptoms and signs involving the nervous system  Other symptoms and signs involving the musculoskeletal system  Other lack of coordination  Frontal lobe and executive function deficit    Problem List Patient Active Problem List   Diagnosis Date Noted  . Parkinsonism ([place]) 07/30/2017  . Dementia with behavioral disturbance  04/30/2016  . Agitation 11/18/2014  . Complicated migraine 64/33/2951  . Cognitive and behavioral changes 11/05/2014  . Altered mental status   . History of migraine headaches   . Acute CVA (cerebrovascular accident) ([place]) 10/27/2014  . Aphasia   . Thoracic aortic aneurysm ([place]) 11/04/2013  . Aortic insufficiency 11/04/2013  . Obstructive sleep apnea 11/04/2013    RINE,KATHRYN 06/17/2018, 8:33 AM  [facility]  [place], [place], [address] Phone: [phone]   Fax:  [phone]  Name: Christopher Atkinson MRN: [id number] Date of Birth: 1941-03-17

## 2018-06-18 ENCOUNTER — Ambulatory Visit: Payer: PPO | Admitting: Speech Pathology

## 2018-06-18 ENCOUNTER — Encounter: Payer: Self-pay | Admitting: Occupational Therapy

## 2018-06-18 ENCOUNTER — Encounter: Payer: Self-pay | Admitting: Speech Pathology

## 2018-06-18 ENCOUNTER — Encounter: Payer: Self-pay | Admitting: Physical Therapy

## 2018-06-18 ENCOUNTER — Ambulatory Visit: Payer: PPO | Admitting: Physical Therapy

## 2018-06-18 ENCOUNTER — Ambulatory Visit: Payer: PPO | Admitting: Occupational Therapy

## 2018-06-18 DIAGNOSIS — R471 Dysarthria and anarthria: Secondary | ICD-10-CM

## 2018-06-18 DIAGNOSIS — M6281 Muscle weakness (generalized): Secondary | ICD-10-CM

## 2018-06-18 DIAGNOSIS — R29818 Other symptoms and signs involving the nervous system: Secondary | ICD-10-CM

## 2018-06-18 DIAGNOSIS — R1312 Dysphagia, oropharyngeal phase: Secondary | ICD-10-CM

## 2018-06-18 DIAGNOSIS — R2681 Unsteadiness on feet: Secondary | ICD-10-CM

## 2018-06-18 DIAGNOSIS — R278 Other lack of coordination: Secondary | ICD-10-CM

## 2018-06-18 DIAGNOSIS — R41844 Frontal lobe and executive function deficit: Secondary | ICD-10-CM

## 2018-06-18 DIAGNOSIS — R293 Abnormal posture: Secondary | ICD-10-CM

## 2018-06-18 DIAGNOSIS — R2689 Other abnormalities of gait and mobility: Secondary | ICD-10-CM

## 2018-06-18 DIAGNOSIS — R29898 Other symptoms and signs involving the musculoskeletal system: Secondary | ICD-10-CM

## 2018-06-18 NOTE — Patient Instructions (Addendum)

## 2018-06-18 NOTE — Therapy (Signed)
[facility] [address] [place], [place], [address] Phone: [phone]   Fax:  [phone]  Speech Language Pathology Treatment  Patient Details  Name: Christopher Atkinson MRN: [id number] Date of Birth: 1941/03/01 Referring Provider: Alonza Bogus, DO   Encounter Date: 06/18/2018  End of Session - 06/18/18 1435    Visit Number  12    Number of Visits  17    Date for SLP Re-Evaluation  07/17/18    SLP Start Time  1232    SLP Stop Time   7353    SLP Time Calculation (min)  41 min    Activity Tolerance  Patient tolerated treatment well;No increased pain       Past Medical History:  Diagnosis Date  . Aortic insufficiency   . Headache   . Obstructive sleep apnea    on CPAP  . Thoracic aortic aneurysm [facility])     Past Surgical History:  Procedure Laterality Date  . BACK SURGERY    . CATARACT EXTRACTION, BILATERAL    . CHOLECYSTECTOMY    . HERNIA REPAIR    . HERNIA REPAIR    . HERNIA REPAIR    . NOSE SURGERY      There were no vitals filed for this visit.  Subjective Assessment - 06/18/18 1238    Subjective  "I had a chicken burrito"    Patient is accompained by:  Family member    Currently in Pain?  No/denies            ADULT SLP TREATMENT - 06/18/18 1238      General Information   Behavior/Cognition  Alert;Cooperative;Pleasant mood      Treatment Provided   Treatment provided  Cognitive-Linquistic      Cognitive-Linquistic Treatment   Treatment focused on  Dysarthria    Skilled Treatment  Volume recalibrated with average of 88dB with 1 modeling due to 1 episode of harsh onset. Structured speech task reading and completing idioms (fill in phrase) with average of  68dB. When pt asked to talk about the idiom or expalin it, volume dropped to 66dB average. Spouse acknowledges that she will have to continue to cue to for volume and swallow precautions due to pt's reduced memory/cognition      Assessment /  Recommendations / Plan   Plan  Continue with current plan of care      Dysphagia Recommendations   Diet recommendations  Dysphagia 3 (mechanical soft);Thin liquid    Liquids provided via  Cup    Medication Administration  Whole meds with puree    Supervision  Full supervision/cueing for compensatory strategies    Compensations  Effortful swallow;Clear throat intermittently;Multiple dry swallows after each bite/sip;Small sips/bites;Slow rate    Postural Changes and/or Swallow Maneuvers  Seated upright 90 degrees      Progression Toward Goals   Progression toward goals  Progressing toward goals         SLP Short Term Goals - 06/18/18 1434      SLP SHORT TERM GOAL #1   Title  pt will follow swallow precautions in 80% of opportunities during session with appropriate POs    Status  Not Met      SLP SHORT TERM GOAL #2   Title  pt will demo swallowing HEP with occasional min A over two sessions    Status  Not Met      SLP SHORT TERM GOAL #3   Title  pt will produce /a/ with average  upper 80s over 4 sessions    Status  Partially Met      SLP SHORT TERM GOAL #4   Title  pt will participate in 3 minutes simple conversation with average volume 69 dB over two sessions    Status  Partially Met      SLP SHORT TERM GOAL #5   Title  pt will answer simple questions with average 70dB in two therapy sessions    Status  Partially Met       SLP Long Term Goals - 06/18/18 1434      SLP LONG TERM GOAL #1   Title  pt will follow swallow precautions >/= 90% of the time with written cues    Time  3    Period  Weeks    Status  On-going      SLP LONG TERM GOAL #2   Title  pt will produce loud /a/ average upper 80s over 6 sessions    Baseline  06/09/18; 06/15/18    Time  3    Period  Weeks    Status  On-going      SLP LONG TERM GOAL #3   Title  pt will participate in 5+ minutes simple conversation with average volume in low 70s dB with occasional nonverbal cues    Time  3    Period   Weeks    Status  On-going      SLP LONG TERM GOAL #4   Title  pt/family will tell 3 overt s/s aspiration PNA    Time  3    Period  Weeks    Status  On-going       Plan - 06/18/18 1434    Clinical Impression Statement  Pt with some improvment in volume today in structured task and simple conversation. Pt verbalizes swallow precautions with questioning cues, however he will require ongoing cueing from sposue for volume and swallow precaution carryover.  Continue skilled ST to maximize safety of swallow and intelligilbity.    Speech Therapy Frequency  2x / week    Treatment/Interventions  Aspiration precaution training;Pharyngeal strengthening exercises;Diet toleration management by SLP;Trials of upgraded texture/liquids;Internal/external aids;Multimodal communcation approach;Patient/family education;Compensatory strategies;SLP instruction and feedback;Cueing hierarchy;Environmental controls;Functional tasks    Potential Considerations  Severity of impairments;Ability to learn/carryover information    SLP Home Exercise Plan  HEP for dysphagia    Consulted and Agree with Plan of Care  Patient;Family member/caregiver    Family Member Consulted  spouse Lib       Patient will benefit from skilled therapeutic intervention in order to improve the following deficits and impairments:   Dysarthria and anarthria  Dysphagia, oropharyngeal phase    Problem List Patient Active Problem List   Diagnosis Date Noted  . Parkinsonism ([place]) 07/30/2017  . 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 Agitation 11/18/2014  . Complicated migraine 22/48/2500  . Cognitive and behavioral changes 11/05/2014  . Altered mental status   . History of migraine headaches   . Acute CVA (cerebrovascular accident) ([place]) 10/27/2014  . Aphasia   . Thoracic aortic aneurysm ([place]) 11/04/2013  . Aortic insufficiency 11/04/2013  . Obstructive sleep apnea 11/04/2013    L[place], Annye Rusk MS, CCC-SLP 06/18/2018,  2:36 PM  [facility] [address] [place], [place], [address] Phone: [phone]   Fax:  [phone]   Name: Christopher Atkinson MRN: [id number] Date of Birth: 06-May-1941

## 2018-06-18 NOTE — Therapy (Addendum)
[facility] [place] [address] [place], [place], [address] Phone: [phone]   Fax:  [phone]  Occupational Therapy Treatment  Patient Details  Name: Christopher Atkinson MRN: [id number] Date of Birth: 1941/08/17 Referring Provider: Dr. Carles Collet    Encounter Date: 06/18/2018  OT End of Session - 06/18/18 1020    Visit Number  12    Number of Visits  17    Date for OT Re-Evaluation  07/05/18    Authorization Type  Medicare    Authorization Time Period  2x week x 8 weeks with 12 week cert period    OT Start Time  1018    OT Stop Time  1100    OT Time Calculation (min)  42 min       Past Medical History:  Diagnosis Date  . Aortic insufficiency   . Headache   . Obstructive sleep apnea    on CPAP  . Thoracic aortic aneurysm [facility])     Past Surgical History:  Procedure Laterality Date  . BACK SURGERY    . CATARACT EXTRACTION, BILATERAL    . CHOLECYSTECTOMY    . HERNIA REPAIR    . HERNIA REPAIR    . HERNIA REPAIR    . NOSE SURGERY      There were no vitals filed for this visit.  Subjective Assessment - 06/18/18 1019    Pertinent History  PMH: Parkinson's disease, CVA, migraines, thoracic aortic aneurysm, sleep apnea, migraines    Patient Stated Goals  To maintain his independence    Currently in Pain?  No/denies              Treatment: Therapist discussed community resources with pt. PT provided pt with a list of resources close to [facility] as pt/ wife ar moving to [facility]. Therapist reviewed fine motor coordination HEP with pt(flipping/ dealing cards, stacking and manipulating coins) increased time and min v.c Pt/ wife agree with plans for d/c.               OT Short Term Goals - 06/18/18 1116      OT SHORT TERM GOAL #1   Title  I with HEP    Status  Achieved      OT SHORT TERM GOAL #2   Title  Pt/ wife will be I with adapted strategies for ADLs/IADLS    Status  Achieved      OT  SHORT TERM GOAL #3   Title  Pt will demonstrate improved fine motor coordination for ADLS as evidenced by decreaseing bilteral 9 hole peg test score by 5 secs.    Baseline  RUE 60.28 secs, LUE 49.78 secs    Status  Partially Met RUE 40 secs, LUE 51 secs      OT SHORT TERM GOAL #4   Title  Pt will demonstrate improved ease with dressing as eveidenced by decreasing PPT#4 to 75 secs of less.    Status  Achieved      OT SHORT TERM GOAL #5   Title  Pt will demonstate ability to retrieve a lightweight object with RUE at 120 shoulder flexion.    Status  Achieved      OT SHORT TERM GOAL #6   Title  Pt will demonstrate ability to retrieve a lightweight object at 120 shoulder flexion with -15 elbow extension with LUE.    Status  Achieved        OT Long Term Goals - 06/18/18 1021  OT LONG TERM GOAL #1   Title  Pt will demonstrate improved ease with fastening buttons as evidenced by decreasing 3 button / unbutton to 70 secs or less.    Baseline  79 secs    Time  8    Period  Weeks    Status  Achieved 27.09 secs      OT LONG TERM GOAL #2   Title  pt will demonstrate increased RUE functional use for ADLS as evidenced by increasing RUE box/ blocks score to 30 blocks    Baseline  RUE 26 blocks, LUE 32 blocks    Time  8    Period  Weeks    Status  Achieved 37 blocks with RUE      OT LONG TERM GOAL #3   Title  Pt/ wife will verbalize understanding of community resources and ways to prevent future PD related complications.     Time  8    Period  Weeks    Status  Achieved      OT LONG TERM GOAL #4   Title  Pt / wife will verbalize understanding of ways to keep thinking skills sharp and compensatory strategies for cognitive changes associated with PD.    Time  8    Period  Weeks    Status  Achieved            Plan - 06/17/18 9381    Clinical Impression Statement  Pt is progressing to wards goals. Pt and wife agree with d/c next vsist.    Occupational performance deficits  (Please refer to evaluation for details):  ADL's;IADL's;Education;Play;Leisure;Social Participation    Rehab Potential  Good    Current Impairments/barriers affecting progress:  cogntive deficits, abnormal posture, bradykinesia, abnormal posture    OT Frequency  2x / week    OT Duration  8 weeks    OT Treatment/Interventions  Self-care/ADL training;Therapeutic exercise;Visual/perceptual remediation/compensation;Patient/family education;Neuromuscular education;Paraffin;Moist Heat;Fluidtherapy;Energy conservation;Therapist, nutritional;Therapeutic activities;Balance training;Cognitive remediation/compensation;Passive range of motion;Manual Therapy;DME and/or AE instruction;Ultrasound;Cryotherapy    Plan  finish checking goals and d/c       Patient will benefit from skilled therapeutic intervention in order to improve the following deficits and impairments:     Visit Diagnosis: Other symptoms and signs involving the nervous system  Other symptoms and signs involving the musculoskeletal system  Other lack of coordination  Frontal lobe and executive function deficit  Abnormal posture  Muscle weakness (generalized)   OCCUPATIONAL THERAPY DISCHARGE SUMMARY   Current functional level related to goals / functional outcomes: Pt made good overall progress, see above. Pt' wife isable to cue him for HEP and adapted strategies.   Remaining deficits: Abnormal posture, decreased coordination, decreased balance, cognitive deficits, bradykinesia, rigidity   Education / Equipment: Pt/ wife were educated regarding adapted strategies for ADLS, PD specific HEP, memory strategies, community resources, and ways to prevent future complications. Pt/ wife verbalize understanding of all education. Pt can benefit from OT eval in 6 mons if he is still living in [place] area. Plan: Patient agrees to discharge.  Patient goals were partially met. Patient is being discharged due to meeting the stated  rehab goals.  ?????      Problem List Patient Active Problem List   Diagnosis Date Noted  . Parkinsonism ([place]) 07/30/2017  . 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 Agitation 11/18/2014  . Complicated migraine 82/99/3716  . Cognitive and behavioral changes 11/05/2014  . Altered mental status   . History of migraine headaches   .  Acute CVA (cerebrovascular accident) ([place]) 10/27/2014  . Aphasia   . Thoracic aortic aneurysm ([place]) 11/04/2013  . Aortic insufficiency 11/04/2013  . Obstructive sleep apnea 11/04/2013    RINE,KATHRYN 06/18/2018, 11:18 AM Theone Murdoch, OTR/L [phone] Phone: [phone] 4:43 PM 06/18/18 [place] [address] [name], [place], [address] Phone: [phone]   Fax:  [phone]  Name: Milad Bublitz MRN: [id number] Date of Birth: 21-Jan-1941

## 2018-06-19 NOTE — Therapy (Signed)
[facility] [address] [place], [place], [address] Phone: [phone]   Fax:  [phone]  Physical Therapy Treatment/Discharge Summary  Patient Details  Name: Christopher Atkinson MRN: [id number] Date of Birth: [birth year] Referring Provider: Dr. Carles Collet    Encounter Date: 06/18/2018  PT End of Session - 06/19/18 0711    Visit Number  13    Number of Visits  17    Date for PT Re-Evaluation  07/30/18    Authorization Type  [employer]    PT Start Time  0937    PT Stop Time  1015    PT Time Calculation (min)  38 min    Equipment Utilized During Treatment  Gait belt    Activity Tolerance  Patient tolerated treatment well;No increased pain    Behavior During Therapy  WFL for tasks assessed/performed       Past Medical History:  Diagnosis Date  . Aortic insufficiency   . Headache   . Obstructive sleep apnea    on CPAP  . Thoracic aortic aneurysm [facility])     Past Surgical History:  Procedure Laterality Date  . BACK SURGERY    . CATARACT EXTRACTION, BILATERAL    . CHOLECYSTECTOMY    . HERNIA REPAIR    . HERNIA REPAIR    . HERNIA REPAIR    . NOSE SURGERY      There were no vitals filed for this visit.  Subjective Assessment - 06/18/18 0941    Subjective  Wife outside with our dog.  No falls since last visit.    Pertinent History  PD, dementia, migraine, CVA, aphasia, OSA    Patient Stated Goals  Pt's goals for therapy are to be able to walk better and stand straighter.    Currently in Pain?  No/denies                       [facility] Adult PT Treatment/Exercise - 06/19/18 0001      Transfers   Transfers  Sit to Stand;Stand to Sit    Sit to Stand  5: Supervision;Without upper extremity assist;From chair/3-in-1    Five time sit to stand comments   16.22    Stand to Sit  5: Supervision;With upper extremity assist;To chair/3-in-1    Number of Reps  2 sets;Other reps (comment) from chair      Ambulation/Gait   Ambulation/Gait  Yes    Ambulation/Gait Assistance  5: Supervision    Ambulation/Gait Assistance Details  Cues for upright posture.  Several episodes of festination with quick stops    Ambulation Distance (Feet)  80 Feet 120x 2, 100    Assistive device  None    Gait Pattern  Step-through pattern;Decreased arm swing - right;Decreased arm swing - left;Decreased step length - right;Shuffle;Decreased trunk rotation;Trunk flexed;Decreased step length - left;Narrow base of support    Gait velocity  2.62 ft/sec      Timed Up and Go Test   TUG  Normal TUG    Normal TUG (seconds)  22.41      Self-Care   Self-Care  Other Self-Care Comments    Other Self-Care Comments   Discussed and provided patient with fall prevention education, discussing home situation now as well as beach home situation and how stressors of moving process may exacerbate PD symptoms/increase fall risk.  Discussed and provided patient with PD support group information and possible PD exercise group information for [facility] area, where  he is moving.  Provided this information from the [employer] of the [facility] website.  Discussed POC and discharge this visit, with pt in agreement.  Recommended, and pt scheduled, return evals in 6 months, as pt plans to return to [facility] for MD visits and PD follow-up.             PT Education - 06/19/18 0709    Education provided  Yes    Education Details  Fall prevention, PD support group and exercise group info for [facility] area through [facility] website; POC and d/c plans/return eval in 6 months.    Person(s) Educated  Patient    Methods  Explanation;Handout    Comprehension  Verbalized understanding       PT Short Term Goals - 05/29/18 1427      PT SHORT TERM GOAL #1   Title  Pt will perform HEP with wife's supervision for improved functional mobility and balance.  TARGET 05/29/18    Time  4     Period  Weeks    Status  Partially Met      PT SHORT TERM GOAL #2   Title  Pt will improve 5x sit<>stand to less than or equal to 17 seconds for improved efficiency and safety with gait.    Time  4    Period  Weeks    Status  Achieved      PT SHORT TERM GOAL #3   Title  Pt will improve TUG score to less than or equal to 20 seconds for decreased fall risk.    Time  4    Period  Weeks    Status  Achieved      PT SHORT TERM GOAL #4   Title  Pt/wife will verbalize understanding of tips to reduce freezing with gait and turns.    Time  4    Period  Weeks    Status  Achieved        PT Long Term Goals - 06/18/18 0943      PT LONG TERM GOAL #1   Title  Pt/wife will verbalize understanding of fall prevention in home environment.  TARGET    Time  8    Period  Weeks    Status  Achieved      PT LONG TERM GOAL #2   Title  Pt will improve 5x sit<>stand to less than or equal to 14 seconds for decreased fall risk.    Baseline  16.22 sec 06/18/18    Time  8    Period  Weeks    Status  Not Met      PT LONG TERM GOAL #3   Title  Pt will improve TUG score to less than or equal to 15 seconds ofr decreased fall risk.    Baseline  22.41    Time  8    Period  Weeks    Status  Not Met      PT LONG TERM GOAL #4   Title  Pt will improve gait velocity to at least 2.62 ft/sec for improved gait efficiency and safety.    Baseline  2.62 ft/sec on 06/18/18    Time  8    Period  Weeks    Status  Achieved      PT LONG TERM GOAL #5   Title  Pt/wife will verbalize plans for ongoing community fitness upon d/c from PT.    Time  8  Period  Weeks    Status  Achieved            Plan - 06/19/18 0717    Clinical Impression Statement  Goals assessed today, with pt meeting LTG 1, 4, and 5.  LTG 2 and 3 not met today (but were previously met when STGs were checked).  Pt has fluctuations in functional mobility, needing extra time, supervision, and cueing on some days of therapy more than others.   Wife has been present for most visits and has been educated in how to assist pt with gait safety with mobility fluctuations.    Rehab Potential  Good    PT Frequency  2x / week    PT Duration  8 weeks plus eval    PT Treatment/Interventions  ADLs/Self Care Home Management;DME Instruction;Gait training;Stair training;Functional mobility training;Therapeutic activities;Therapeutic exercise;Balance training;Neuromuscular re-education;Patient/family education    PT Next Visit Plan  Discharge this visit, with return eval in 6 months    Consulted and Agree with Plan of Care  Patient    Family Member Consulted          Patient will benefit from skilled therapeutic intervention in order to improve the following deficits and impairments:  Abnormal gait, Decreased balance, Decreased mobility, Decreased safety awareness, Difficulty walking, Decreased strength, Postural dysfunction  Visit Diagnosis: Other abnormalities of gait and mobility  Unsteadiness on feet     Problem List Patient Active Problem List   Diagnosis Date Noted  . Parkinsonism ([place]) 07/30/2017  . 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 Agitation 11/18/2014  . Complicated migraine 67/89/3810  . Cognitive and behavioral changes 11/05/2014  . Altered mental status   . History of migraine headaches   . Acute CVA (cerebrovascular accident) ([place]) 10/27/2014  . Aphasia   . Thoracic aortic aneurysm ([place]) 11/04/2013  . Aortic insufficiency 11/04/2013  . Obstructive sleep apnea 11/04/2013    Evona Westra W. 06/19/2018, 7:21 AM  Frazier Butt., PT   [facility] [address] [place], [place], [address] Phone: [phone]   Fax:  [phone]  Name: Christopher Atkinson MRN: [id number] Date of Birth: Dec 03, 1941   PHYSICAL THERAPY DISCHARGE SUMMARY  Visits from Start of Care: 13  Current functional level related to goals / functional outcomes: PT Long Term  Goals - 06/18/18 0943      PT LONG TERM GOAL #1   Title  Pt/wife will verbalize understanding of fall prevention in home environment.  TARGET    Time  8    Period  Weeks    Status  Achieved      PT LONG TERM GOAL #2   Title  Pt will improve 5x sit<>stand to less than or equal to 14 seconds for decreased fall risk.    Baseline  16.22 sec 06/18/18    Time  8    Period  Weeks    Status  Not Met      PT LONG TERM GOAL #3   Title  Pt will improve TUG score to less than or equal to 15 seconds ofr decreased fall risk.    Baseline  22.41    Time  8    Period  Weeks    Status  Not Met      PT LONG TERM GOAL #4   Title  Pt will improve gait velocity to at least 2.62 ft/sec for improved gait efficiency and safety.    Baseline  2.62 ft/sec on 06/18/18  Time  8    Period  Weeks    Status  Achieved      PT LONG TERM GOAL #5   Title  Pt/wife will verbalize plans for ongoing community fitness upon d/c from PT.    Time  8    Period  Weeks    Status  Achieved      Pt has met 3 of 5 LTGs, improving gait velocity and improving transfer technique.   Remaining deficits: Posture, balance, fluctuating mobility   Education / Equipment: Pt/wife educated in HEP, fall prevention, PD resources   Plan: Patient agrees to discharge.  Patient goals were partially met. Patient is being discharged due to being pleased with the current functional level.  ?????Recommend return eval in 6 months due to progressive nature of disease process.    Mady Haagensen, PT 06/19/18 7:23 AM Phone: [phone] Fax: [phone]

## 2018-06-24 NOTE — Progress Notes (Signed)
Christopher Atkinson was seen today in the movement disorders clinic for neurologic consultation at the request of Copland, Gay Filler, MD.  The consultation is for the 2nd opinion evaluation of PD.  The records that were made available to me were reviewed.  Pt has been seeing Dr. Jaynee Eagles and Ward Givens.  Patient has been seeing Dr. Jaynee Eagles since 2015.  On her first visit in epic with [facility] neurology, he was being seen for complicated headache, that would manifest at times as expressive aphasia and strokelike syndromes.  He had memory change at that point in time.  In fact, Depakote was being administered for agitation and inappropriate behavior.  Wife states that the issue was when he couldn't speak with headache, he would be agitated and she thought that VPA was for headache.    His Moca was 19 in 2015.  By August, 2016 he was on Namenda.  He was referred back to [facility] neurology in March, 2017 for tremor.  Records indicate postural tremor was noted.  Depakote dose was decreased because of tremor.  Propranolol was attempted but discontinued because of bradycardia.  Notes from [facility] neurology from August, 2018 indicate that the patient was shuffling and freezing more.  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  Levodopa was attempted in August, 2018.  Wife reports he was dizzy on that and he only took it for 2 weeks.    06/26/18 update: Patient is seen today in follow-up for Parkinson's disease.  He is accompanied by his wife who supplements the history.  He was started on carbidopa/levodopa 25/100 CR last visit and work to 1 tablet 3 times per day (previously had dizziness with IR version).  Today, he reports that he isn't sure if it helps but wife thinks that it does.  After examining him, the patient does state that he is much better than he was.  He isn't doing CV exercise but lifts light weights at home.  He is dreaming a lot and the patient's wife is no longer sleeping with him, but  then the patient states that dreaming has been somewhat better lately.  He has not fallen out of the bed..  Records have been reviewed since last visit.  He has faithfully attended rehab therapies.  PREVIOUS MEDICATIONS: Sinemet  ALLERGIES:   Allergies  Allergen Reactions  . Simvastatin     Other reaction(s): FATIGUE    CURRENT MEDICATIONS:  Outpatient Encounter Medications as of 06/26/2018  Medication Sig  . butalbital-acetaminophen-caffeine (FIORICET, ESGIC) 50-325-40 MG tablet TAKE 1 TABLET BY MOUTH EVERY 6 HOURS AS NEEDED FOR HEADACHE( DO NOT TAKE MORE THAN 10 TABLETS PER MONTH)  . Carbidopa-Levodopa ER (SINEMET CR) 25-100 MG tablet controlled release Take 1 tablet by mouth 3 (three) times daily.  . divalproex (DEPAKOTE ER) 500 MG 24 hr tablet Take 1 tablet (500 mg total) by mouth daily.  Marland Kitchen donepezil (ARICEPT) 10 MG tablet Take 1 tablet (10 mg total) by mouth at bedtime.  Marland Kitchen doxycycline (VIBRAMYCIN) 100 MG capsule Take 100 mg by mouth 2 (two) times daily.  . memantine (NAMENDA XR) 28 MG CP24 24 hr capsule TAKE 1 CAPSULE BY MOUTH EVERY DAY  . Omega-3 Fatty Acids (FISH OIL) 1000 MG CAPS Take 1 tablet by mouth 3 (three) times daily.   Marland Kitchen tobramycin-dexamethasone (TOBRADEX) ophthalmic solution Place 1 drop into both eyes as needed.   No facility-administered encounter medications on file as of 06/26/2018.     PAST MEDICAL  HISTORY:   Past Medical History:  Diagnosis Date  . Aortic insufficiency   . Headache   . Obstructive sleep apnea    on CPAP  . Thoracic aortic aneurysm ([place])     PAST SURGICAL HISTORY:   Past Surgical History:  Procedure Laterality Date  . BACK SURGERY    . CATARACT EXTRACTION, BILATERAL    . CHOLECYSTECTOMY    . HERNIA REPAIR    . HERNIA REPAIR    . HERNIA REPAIR    . NOSE SURGERY      SOCIAL HISTORY:   Social History   Socioeconomic History  . Marital status: Married    Spouse name: Christopher Atkinson   . Number of children: 2  . Years of education: 86  .  Highest education level: Not on file  Occupational History  . Occupation: retired    Comment: Firefighter  Social Needs  . Financial resource strain: Not on file  . Food insecurity:    Worry: Not on file    Inability: Not on file  . Transportation needs:    Medical: Not on file    Non-medical: Not on file  Tobacco Use  . Smoking status: Former Research scientist (life sciences)  . Smokeless tobacco: Never Used  . Tobacco comment: quit smoking when he was 77 years old  Substance and Sexual Activity  . Alcohol use: No    Alcohol/week: 0.0 oz  . Drug use: No  . Sexual activity: Not on file  Lifestyle  . Physical activity:    Days per week: Not on file    Minutes per session: Not on file  . Stress: Not on file  Relationships  . Social connections:    Talks on phone: Not on file    Gets together: Not on file    Attends religious service: Not on file    Active member of club or organization: Not on file    Attends meetings of clubs or organizations: Not on file    Relationship status: Not on file  . Intimate partner violence:    Fear of current or ex partner: Not on file    Emotionally abused: Not on file    Physically abused: Not on file    Forced sexual activity: Not on file  Other Topics Concern  . Not on file  Social History Narrative   Patient lives at home with wife Christopher Atkinson    Patient as 2 children.    Patient is right handed.    Patient has a high school education.     FAMILY HISTORY:   Family Status  Relation Name Status  . Father  Deceased  . Mother  Deceased  . Brother  Deceased  . Brother  Deceased  . Brother  Deceased  . Son  Alive  . Daughter  Alive  . Brother  Alive  . Brother  Alive    ROS:  Review of Systems  Constitutional: Negative.   HENT: Negative.   Eyes: Negative.   Respiratory: Negative.   Cardiovascular: Negative.   Musculoskeletal: Negative.   Skin: Negative.      PHYSICAL EXAMINATION:    VITALS:   Vitals:   06/26/18 1511  BP: 108/64  Pulse: (!)  58  SpO2: 93%  Weight: 164 lb (74.4 kg)  Height: 5' 10.5" (1.791 m)    GEN:  The patient appears stated age and is in NAD. HEENT:  Normocephalic, atraumatic.  The mucous membranes are moist. The superficial temporal arteries are without ropiness  or tenderness.  He drools slightly.  Nose is not running like last visit. Cardiovascular: Bradycardic.  Regular. Lungs: Clear to auscultation bilaterally. Neck/HEME:  There are no carotid bruits bilaterally.  Neurological examination:  Orientation: He is alert and oriented x3. Montreal Cognitive Assessment  04/08/2018 02/21/2016 11/16/2014 11/02/2014  Visuospatial/ Executive (0/5) 4 3 4 4   Naming (0/3) 2 2 3 3   Attention: Read list of digits (0/2) 2 2 2 2   Attention: Read list of letters (0/1) 1 1 1 1   Attention: Serial 7 subtraction starting at 100 (0/3) 3 3 3 2   Language: Repeat phrase (0/2) 2 2 2 2   Language : Fluency (0/1) 1 0 0 0  Abstraction (0/2) 1 1 2 1   Delayed Recall (0/5) 1 0 0 0  Orientation (0/6) 6 6 4 4   Total 23 20 21 19   Adjusted Score (based on education) 23 21 22 20      Cranial nerves: There is good facial symmetry.  There is facial hypomimia.  The speech is fluent and clear.  He is hypophonic.  Soft palate rises symmetrically and there is no tongue deviation. Hearing is intact to conversational tone. Sensation: Sensation is intact to light touch throughout Motor: Strength is 5/5 in the bilateral upper and lower extremities.   Shoulder shrug is equal and symmetric.  There is no pronator drift.  Movement examination: Tone: There is normal tone in the left upper extremity today.  There is mild increased tone in the right upper extremity today.  This is markedly improved compared to previous. Abnormal movements: There is no tremor noted today. Coordination:  There is mild decremation with RAM's, with any form of RAMS, including alternating supination and pronation of the forearm, hand opening and closing, finger taps, heel  taps and toe taps bilaterally Gait and Station: The patient has mild difficulty arising out of a deep-seated chair without the use of the hands.  He is able to arise without the use of his hands on the third attempt.  He has a stooped posture, but purposefully tries to stand tall as he learned in therapy and tries to walk with big steps.  When he is not trying, he does shuffle and festinate's.   ASSESSMENT/PLAN:  1.  Idiopathic Parkinson's disease  -Patient looks markedly better on carbidopa/levodopa 25/100 CR, 1 tablet 3 times per day.  He is slightly underdosed, but I really did not want to change this given that he has been leery with medication in the past.  He has not had side effects.  2.  Sialorrhea  -This is commonly associated with PD.  We talked about treatments.  The patient is not a candidate for oral anticholinergic therapy because of increased risk of confusion and falls.  We discussed Botox (type A and B) and 1% atropine drops.  We discusssed that candy like lemon drops can help by stimulating mm of the oropharynx to induce swallowing.  He will think about his options.  3.  REM behavior disorder  -This is commonly associated with PD and the patient is experiencing this.  We discussed that this can be very serious and even harmful.  We talked about medications as well as physical barriers to put in the bed (particularly soft bed rails, pillow barriers).  We talked about moving the night stand so that it is not so close to the side of the bed.  Ultimately, he decided that this was not so bad and really did not want any medications.  He will make sure that his sleeping quarters are safe.  He was given patient education on this.  4.  Follow up is anticipated in the next few months, sooner should new neurologic issues arise.  Much greater than 50% of this visit was spent in counseling and coordinating care.  Total face to face time:  25 min   Cc:  Copland, Gay Filler, MD

## 2018-06-26 ENCOUNTER — Ambulatory Visit: Payer: PPO | Admitting: Neurology

## 2018-06-26 ENCOUNTER — Encounter: Payer: Self-pay | Admitting: Neurology

## 2018-06-26 VITALS — BP 108/64 | HR 58 | Ht 70.5 in | Wt 164.0 lb

## 2018-06-26 DIAGNOSIS — G4752 REM sleep behavior disorder: Secondary | ICD-10-CM | POA: Diagnosis not present

## 2018-06-26 DIAGNOSIS — K117 Disturbances of salivary secretion: Secondary | ICD-10-CM

## 2018-06-26 DIAGNOSIS — G20 Parkinson's disease: Secondary | ICD-10-CM

## 2018-06-26 NOTE — Patient Instructions (Signed)
You look great!  Keep up the good work!  Registration is OPEN!    Third Annual Parkinson's Education Symposium   To register: [email or web address]      Search:  [employer] person attending individually Questions: [place], [place] or [email or web address] [email or web address]    [facility] for Parkinson's & Movement Disorders  The [facility] Movement [facility] team know that living well with a movement disorder extends far beyond our clinic walls. We are together with you. Our team is passionate about providing resources to you and your loved ones who are living with Parkinson's disease and movement disorders. Participate in these programs and join our community. These resources are free or low cost!   [facility] is adding:   Innovative educational programs for patients and caregivers.   Support groups for patients and caregivers living with Parkinson's disease.   Parkinson's specific exercise programs.   Custom tailored therapeutic programs that will benefit patient's living with Parkinson's disease.   We are in this together. You can help and contribute to grow these programs and resources in our community. 100% of the funds donated to the [facility] stays right here in our community to support patients and their caregivers.  To make a tax deductible contribution:  -ask for a [employer] [facility] for Parkinson's envelope in the office today.  - call the Office of Institutional Advancement at [phone].

## 2018-07-10 ENCOUNTER — Encounter

## 2018-07-21 ENCOUNTER — Ambulatory Visit: Payer: PPO | Attending: Neurology

## 2018-07-21 DIAGNOSIS — R471 Dysarthria and anarthria: Secondary | ICD-10-CM | POA: Insufficient documentation

## 2018-07-21 DIAGNOSIS — R1312 Dysphagia, oropharyngeal phase: Secondary | ICD-10-CM | POA: Insufficient documentation

## 2018-07-21 NOTE — Patient Instructions (Signed)
You need to do the loud "ah" 6 days a week.  Talk to each other in the same room.  Face each other when you speak.

## 2018-07-21 NOTE — Therapy (Signed)
[facility] [address] [place], [place], [address] Phone: [phone]   Fax:  [phone]  Speech Language Pathology Treatment  Patient Details  Name: Christopher Atkinson MRN: [id number] Date of Birth: 08-21-1941 Referring Provider: Alonza Bogus, DO   Encounter Date: 07/21/2018  End of Session - 07/21/18 1529    Visit Number  13    Number of Visits  17    Date for SLP Re-Evaluation  07/24/18    SLP Start Time  8099    SLP Stop Time   1445    SLP Time Calculation (min)  42 min    Activity Tolerance  Patient tolerated treatment well       Past Medical History:  Diagnosis Date  . Aortic insufficiency   . Headache   . Obstructive sleep apnea    on CPAP  . Thoracic aortic aneurysm [facility])     Past Surgical History:  Procedure Laterality Date  . BACK SURGERY    . CATARACT EXTRACTION, BILATERAL    . CHOLECYSTECTOMY    . HERNIA REPAIR    . HERNIA REPAIR    . HERNIA REPAIR    . NOSE SURGERY      There were no vitals filed for this visit.  Subjective Assessment - 07/21/18 1407    Subjective  (unintelligible), then "We reached teh donut hole." slower and with overarticulation.    Patient is accompained by:  Family member wife    Currently in Pain?  No/denies            ADULT SLP TREATMENT - 07/21/18 1409      General Information   Behavior/Cognition  Alert;Cooperative;Pleasant mood      Treatment Provided   Treatment provided  Cognitive-Linquistic      Cognitive-Linquistic Treatment   Treatment focused on  Dysarthria    Skilled Treatment  SLP facilitated volume recalibration with loud /a/ produced average of mid to upper 80s dB with initial modeling fading to nonverbal cues for loudness and occasional cues for full breath. SLP educated wife pt may require cues for full breath. Structured speech tasks requiring sentence responses were produced with average in mid to upper 60s dB. Spouse continues to indicate  knowing she will have to continue to cue to for volume and swallow precautions due to pt's reduced memory/cognition      Assessment / Recommendations / Plan   Plan  Continue with current plan of care      Dysphagia Recommendations   Diet recommendations  Dysphagia 3 (mechanical soft);Thin liquid    Liquids provided via  Cup    Medication Administration  Whole meds with puree    Supervision  Full supervision/cueing for compensatory strategies    Compensations  Effortful swallow;Clear throat intermittently;Multiple dry swallows after each bite/sip;Small sips/bites;Slow rate    Postural Changes and/or Swallow Maneuvers  Seated upright 90 degrees      Progression Toward Goals   Progression toward goals  Progressing toward goals       SLP Education - 07/21/18 1528    Education provided  Yes    Education Details  positional compensations to encourage functional communication    Person(s) Educated  Patient;Spouse    Methods  Explanation    Comprehension  Verbalized understanding       SLP Short Term Goals - 06/18/18 1434      SLP SHORT TERM GOAL #1   Title  pt will follow swallow precautions in 80% of opportunities during  session with appropriate POs    Status  Not Met      SLP SHORT TERM GOAL #2   Title  pt will demo swallowing HEP with occasional min A over two sessions    Status  Not Met      SLP SHORT TERM GOAL #3   Title  pt will produce /a/ with average upper 80s over 4 sessions    Status  Partially Met      SLP SHORT TERM GOAL #4   Title  pt will participate in 3 minutes simple conversation with average volume 69 dB over two sessions    Status  Partially Met      SLP SHORT TERM GOAL #5   Title  pt will answer simple questions with average 70dB in two therapy sessions    Status  Partially Met       SLP Long Term Goals - 07/21/18 1532      SLP LONG TERM GOAL #1   Title  pt will follow swallow precautions >/= 90% of the time with written cues    Time  1    Period   Weeks    Status  On-going      SLP LONG TERM GOAL #2   Title  pt will produce loud /a/ average upper 80s over 6 sessions    Baseline  06/09/18; 06/15/18, 07-21-18    Time  1    Period  Weeks    Status  On-going      SLP LONG TERM GOAL #3   Title  pt will participate in 5+ minutes simple conversation with average volume in low 70s dB with occasional nonverbal cues    Time  1    Period  Weeks    Status  On-going      SLP LONG TERM GOAL #4   Title  pt/family will tell 3 overt s/s aspiration PNA    Time  1    Period  Weeks    Status  On-going       Plan - 07/21/18 1530    Clinical Impression Statement  Pt with cont'd success with volume today in structured tasks, and with some nonverbal cues pt able to perform functionally with conversation. Wife endorses she will need to cue pt for loudness and to follow swallow precautions due to decr'd cognition/memory. Continue skilled ST for one more visit to maximize safety of swallow and intelligilbity.    Speech Therapy Frequency  2x / week    Duration  -- 8 weeks or 17 total visits    Treatment/Interventions  Aspiration precaution training;Pharyngeal strengthening exercises;Diet toleration management by SLP;Trials of upgraded texture/liquids;Internal/external aids;Multimodal communcation approach;Patient/family education;Compensatory strategies;SLP instruction and feedback;Cueing hierarchy;Environmental controls;Functional tasks    Potential Considerations  Severity of impairments;Ability to learn/carryover information    SLP Home Exercise Plan  HEP for dysphagia    Consulted and Agree with Plan of Care  Patient;Family member/caregiver    Family Member Consulted  spouse Christopher Atkinson       Patient will benefit from skilled therapeutic intervention in order to improve the following deficits and impairments:   Dysarthria and anarthria  Dysphagia, oropharyngeal phase    Problem List Patient Active Problem List   Diagnosis Date Noted  . Parkinsonism  ([place]) 07/30/2017  . 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 Agitation 11/18/2014  . Complicated migraine 06/17/9484  . Cognitive and behavioral changes 11/05/2014  . Altered mental status   . History of migraine headaches   .  Acute CVA (cerebrovascular accident) ([place]) 10/27/2014  . Aphasia   . Thoracic aortic aneurysm ([place]) 11/04/2013  . Aortic insufficiency 11/04/2013  . Obstructive sleep apnea 11/04/2013    [facility] ,[place], CCC-SLP  07/21/2018, 3:32 PM  [facility] [address] [place], [place], [address] Phone: [phone]   Fax:  [phone]   Name: Christopher Atkinson MRN: [id number] Date of Birth: 1941/12/14

## 2018-07-22 ENCOUNTER — Ambulatory Visit: Payer: PPO

## 2018-07-24 ENCOUNTER — Ambulatory Visit: Payer: PPO | Attending: Neurology

## 2018-07-24 DIAGNOSIS — R471 Dysarthria and anarthria: Secondary | ICD-10-CM

## 2018-07-24 DIAGNOSIS — R1312 Dysphagia, oropharyngeal phase: Secondary | ICD-10-CM | POA: Insufficient documentation

## 2018-07-24 NOTE — Therapy (Addendum)
[facility] [address] [place], [place], [address] Phone: [phone]   Fax:  [phone]  Speech Language Pathology Treatment/discharge  Patient Details  Name: Cartrell Bentsen MRN: [id number] Date of Birth: 01/30/1941 Referring Provider: Alonza Bogus, DO   Encounter Date: 07/24/2018  End of Session - 07/24/18 1228    Visit Number  14    Number of Visits  17    Date for SLP Re-Evaluation  07/24/18    SLP Start Time  0848    SLP Stop Time   0930    SLP Time Calculation (min)  42 min    Activity Tolerance  Patient tolerated treatment well       Past Medical History:  Diagnosis Date  . Aortic insufficiency   . Headache   . Obstructive sleep apnea    on CPAP  . Thoracic aortic aneurysm [facility])     Past Surgical History:  Procedure Laterality Date  . BACK SURGERY    . CATARACT EXTRACTION, BILATERAL    . CHOLECYSTECTOMY    . HERNIA REPAIR    . HERNIA REPAIR    . HERNIA REPAIR    . NOSE SURGERY      There were no vitals filed for this visit.  Subjective Assessment - 07/24/18 0858    Subjective  "How you been?" Wife reports pt is louder overall than he was 6 months ago prior to [place].    Patient is accompained by:  Family member wife    Currently in Pain?  No/denies            ADULT SLP TREATMENT - 07/24/18 0858      General Information   Behavior/Cognition  Alert;Cooperative;Pleasant mood      Treatment Provided   Treatment provided  Cognitive-Linquistic;Dysphagia      Dysphagia Treatment   Other treatment/comments  Wife indicated she has handout re: aspiration PNA s/s at home. Pt completed HEP for dysphagia with occasional min A - wife states she is present to help pt PRN with procedure. Pt's frequency of coughing has decr'd considerably since precautions were initiated. SLP ensured pt/wife know pt must compelte HEP x3/week.      Cognitive-Linquistic Treatment   Treatment focused on  Dysarthria     Skilled Treatment  SLP facilitated volume recalibration with loud /a/ produced average of mid to upper 80s dB with usual nonverbal cues for loudness, and rare cues for full breath. Wife endorses pt may require assistance for loud /a/. Structured speech tasks requiring sentence responses were produced with average in upper 60s- low 70s dB. Again, spouse endorses knowledge she will have to continue to cue to for volume and swallow precautions due to pt's reduced memory/cognition. Pt and wife both thanked SLP for assistance with swalowing and with speech volume. both indicate being pleased with results of therapy.      Assessment / Recommendations / Plan   Plan  -- d/c today      Dysphagia Recommendations   Diet recommendations  Dysphagia 3 (mechanical soft);Thin liquid    Liquids provided via  Cup    Medication Administration  Whole meds with puree    Supervision  Full supervision/cueing for compensatory strategies    Compensations  Effortful swallow;Clear throat intermittently;Multiple dry swallows after each bite/sip;Small sips/bites;Slow rate    Postural Changes and/or Swallow Maneuvers  Seated upright 90 degrees      Progression Toward Goals   Progression toward goals  -- d/c day-see goal update  SLP Short Term Goals - 06/18/18 1434      SLP SHORT TERM GOAL #1   Title  pt will follow swallow precautions in 80% of opportunities during session with appropriate POs    Status  Not Met      SLP SHORT TERM GOAL #2   Title  pt will demo swallowing HEP with occasional min A over two sessions    Status  Not Met      SLP SHORT TERM GOAL #3   Title  pt will produce /a/ with average upper 80s over 4 sessions    Status  Partially Met      SLP SHORT TERM GOAL #4   Title  pt will participate in 3 minutes simple conversation with average volume 69 dB over two sessions    Status  Partially Met      SLP SHORT TERM GOAL #5   Title  pt will answer simple questions with average 70dB in two  therapy sessions    Status  Partially Met       SLP Long Term Goals - 07/24/18 1231      SLP LONG TERM GOAL #1   Title  pt will follow swallow precautions >/= 90% of the time with written cues    Status  Partially Met verbal cues necessary      SLP LONG TERM GOAL #2   Title  pt will produce loud /a/ average upper 80s over 6 sessions    Baseline  06/09/18; 06/15/18, 07-21-18    Status  Partially Met 4 sessions      SLP LONG TERM GOAL #3   Title  pt will participate in 5+ minutes simple conversation with average volume in low 70s dB with occasional nonverbal cues    Status  Achieved      SLP LONG TERM GOAL #4   Title  pt/family will tell 3 overt s/s aspiration PNA    Status  Achieved       Plan - 07/24/18 1229    Clinical Impression Statement  Pt with cont'd success with volume today in structured tasks, and wife endorses she will need to cue pt for loudness and to follow swallow precautions due to decr'd cognition/memory. Skilled ST at this time will be d/c'd with pt to cont to work with dysphagia HEP, and loud /a/ and more WNL speech with verbal/nonverbal cues at home.     Treatment/Interventions  Aspiration precaution training;Pharyngeal strengthening exercises;Diet toleration management by SLP;Trials of upgraded texture/liquids;Internal/external aids;Multimodal communcation approach;Patient/family education;Compensatory strategies;SLP instruction and feedback;Cueing hierarchy;Environmental controls;Functional tasks    Potential Considerations  Severity of impairments;Ability to learn/carryover information    SLP Home Exercise Plan  HEP for dysphagia    Consulted and Agree with Plan of Care  Patient;Family member/caregiver    Family Member Consulted  spouse Lib       Patient will benefit from skilled therapeutic intervention in order to improve the following deficits and impairments:   Dysarthria and anarthria  Dysphagia, oropharyngeal phase   SPEECH THERAPY RENEWAL/DISCHARGE  SUMMARY  Visits from Start of Care: 14  Current functional level related to goals / functional outcomes: Pt will have current goals enforced for his final session, today.  and wife indicate pt's swallow function has improved with focus on adherence to swallow precautions, and to dysphagia HEP. Additionally, pt and wife report pt is easier to understand now than prior to initiation of this course of ST.    Remaining deficits: Cognitive linguistic  deficits, dysphagia, dysarthria, aphasia.   Education / Equipment: HEP for dysphagia, swallow precautions, how to structure environment to maximize communicative exchanges.   Plan: Patient agrees to discharge.  Patient goals were partially met. Patient is being discharged due to being pleased with the current functional level.  ?????       Problem List Patient Active Problem List   Diagnosis Date Noted  . Parkinsonism ([place]) 07/30/2017  . 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 Agitation 11/18/2014  . Complicated migraine 41/29/0475  . Cognitive and behavioral changes 11/05/2014  . Altered mental status   . History of migraine headaches   . Acute CVA (cerebrovascular accident) ([place]) 10/27/2014  . Aphasia   . Thoracic aortic aneurysm ([place]) 11/04/2013  . Aortic insufficiency 11/04/2013  . Obstructive sleep apnea 11/04/2013    [facility] 07/24/2018, 12:33 PM  [facility] [address] [place], [place], [address] Phone: [phone]   Fax:  [phone]   Name: Andron Marrazzo MRN: [id number] Date of Birth: [birth year]

## 2018-07-24 NOTE — Addendum Note (Signed)
Addended by: Garald Balding B on: 07/24/2018 04:50 PM   Modules accepted: Orders

## 2018-07-24 NOTE — Patient Instructions (Signed)
  Do swallow exercises three times a week, twice a day  Keep doing your loud "ah" x6-7 twice a day

## 2018-09-24 DIAGNOSIS — N3941 Urge incontinence: Secondary | ICD-10-CM | POA: Diagnosis not present

## 2018-09-24 DIAGNOSIS — R351 Nocturia: Secondary | ICD-10-CM | POA: Diagnosis not present

## 2018-09-28 ENCOUNTER — Other Ambulatory Visit: Payer: Self-pay | Admitting: Neurology

## 2018-09-29 DIAGNOSIS — Z85828 Personal history of other malignant neoplasm of skin: Secondary | ICD-10-CM | POA: Diagnosis not present

## 2018-09-29 DIAGNOSIS — D485 Neoplasm of uncertain behavior of skin: Secondary | ICD-10-CM | POA: Diagnosis not present

## 2018-09-29 DIAGNOSIS — C44329 Squamous cell carcinoma of skin of other parts of face: Secondary | ICD-10-CM | POA: Diagnosis not present

## 2018-09-29 DIAGNOSIS — Z08 Encounter for follow-up examination after completed treatment for malignant neoplasm: Secondary | ICD-10-CM | POA: Diagnosis not present

## 2018-09-29 DIAGNOSIS — D692 Other nonthrombocytopenic purpura: Secondary | ICD-10-CM | POA: Diagnosis not present

## 2018-10-20 NOTE — Progress Notes (Addendum)
[place] at [facility] [place], [place], [place] [address] [phone]  Date:  10/22/2018   Name:  Christopher Atkinson   DOB:  1941-02-17   MRN:  [id number]  PCP:  Darreld Mclean, MD    Chief Complaint: Annual Exam (constant runny nose, coughing, mucous) and Urinary Tract Infection (dark urine, frequency, dysuria)   History of Present Illness:  Christopher Atkinson is a 77 y.o. very pleasant male patient who presents with the following:  Here today for a CPE History of parkinson's disease, dementia, CVA, aphasia, OSA  Labs: due. ?psa testing desired.  He does not wish to have to have this test given his other health concerns  Immun: needs flu shot ?pneumonia vaccines elsewhere, tetanus.  He thinks that he got one pneumonia shot so far- would like to update so will give him a prevnar today  Colon: 2010 per pt report  He recently saw Dr. Carles Collet for a 2nd neurology opinion- per her note: 1.  Idiopathic Parkinson's disease             -Patient looks markedly better on carbidopa/levodopa 25/100 CR, 1 tablet 3 times per day.  He is slightly underdosed, but I really did not want to change this given that he has been leery with medication in the past.  He has not had side effects. 2.  Sialorrhea             -This is commonly associated with PD.  We talked about treatments.  The patient is not a candidate for oral anticholinergic therapy because of increased risk of confusion and falls.  We discussed Botox (type A and B) and 1% atropine drops.  We discusssed that candy like lemon drops can help by stimulating mm of the oropharynx to induce swallowing.  He will think about his options. 3.  REM behavior disorder             -This is commonly associated with PD and the patient is experiencing this.  We discussed that this can be very serious and even harmful.  We talked about medications as well as physical barriers to put in the bed (particularly soft  bed rails, pillow barriers).  We talked about moving the night stand so that it is not so close to the side of the bed.  Ultimately, he decided that this was not so bad and really did not want any medications.  He will make sure that his sleeping quarters are safe.  He was given patient education on this. 4.  Follow up is anticipated in the next few months, sooner should new neurologic issues arise.  Much greater than 50% of this visit was spent in counseling and coordinating care.  Total face to face time:  25 min  He does take a baby aspirin and has some bruising on his arms but no other bleeding  Other meds per Dr. Carles Collet- they think she may be able to stop some of his meds later on  He drinks only juice and water His appetite is good  They have noted strong smelling and dark urine for about 2 weeks as well  He is seeing a urologist -  [facility], Dr. Jeffie Pollock .   He was started on Myrbetriq over the summer- was also seen more recently but I don't have this note yet. Will call request They do think myrbetriq is helping Wife Christopher Atkinson is worried about his gait and  fall risk. She would like to have him use his walker more and I did encourage him to do this   Wt Readings from Last 3 Encounters:  10/22/18 161 lb 3.2 oz (73.1 kg)  06/26/18 164 lb (74.4 kg)  04/08/18 166 lb (75.3 kg)     Patient Active Problem List   Diagnosis Date Noted  . Parkinsonism ([place]) 07/30/2017  . Dementia with behavioral disturbance ([place]) 04/30/2016  . Agitation 11/18/2014  . Complicated migraine 51/01/5851  . Cognitive and behavioral changes 11/05/2014  . Altered mental status   . History of migraine headaches   . Acute CVA (cerebrovascular accident) ([place]) 10/27/2014  . Aphasia   . Thoracic aortic aneurysm ([place]) 11/04/2013  . Aortic insufficiency 11/04/2013  . Obstructive sleep apnea 11/04/2013    Past Medical History:  Diagnosis Date  . Aortic insufficiency   . Headache   . Obstructive sleep apnea    on  CPAP  . Thoracic aortic aneurysm [facility])     Past Surgical History:  Procedure Laterality Date  . BACK SURGERY    . CATARACT EXTRACTION, BILATERAL    . CHOLECYSTECTOMY    . HERNIA REPAIR    . HERNIA REPAIR    . HERNIA REPAIR    . NOSE SURGERY      Social History   Tobacco Use  . Smoking status: Former Research scientist (life sciences)  . Smokeless tobacco: Never Used  . Tobacco comment: quit smoking when he was 77 years old  Substance Use Topics  . Alcohol use: No    Alcohol/week: 0.0 standard drinks  . Drug use: No    Family History  Problem Relation Age of Onset  . Heart Problems Father        CHF  . Dementia Mother   . Stroke Brother   . Dementia Brother   . Parkinson's disease Brother   . Cancer Brother        Throat cancer    Allergies  Allergen Reactions  . Simvastatin     Other reaction(s): FATIGUE    Medication list has been reviewed and updated.  Current Outpatient Medications on File Prior to Visit  Medication Sig Dispense Refill  . butalbital-acetaminophen-caffeine (FIORICET, ESGIC) 50-325-40 MG tablet TAKE 1 TABLET BY MOUTH EVERY 6 HOURS AS NEEDED FOR HEADACHE( DO NOT TAKE MORE THAN 10 TABLETS PER MONTH) 10 tablet 5  . Carbidopa-Levodopa ER (SINEMET CR) 25-100 MG tablet controlled release TAKE 1 TABLET BY MOUTH THREE TIMES A DAY 270 tablet 1  . divalproex (DEPAKOTE ER) 500 MG 24 hr tablet Take 1 tablet (500 mg total) by mouth daily. 90 tablet 2  . donepezil (ARICEPT) 10 MG tablet Take 1 tablet (10 mg total) by mouth at bedtime. 90 tablet 2  . doxycycline (VIBRAMYCIN) 100 MG capsule Take 100 mg by mouth 2 (two) times daily.    . memantine (NAMENDA XR) 28 MG CP24 24 hr capsule TAKE 1 CAPSULE BY MOUTH EVERY DAY 30 capsule 5  . Omega-3 Fatty Acids (FISH OIL) 1000 MG CAPS Take 1 tablet by mouth 3 (three) times daily.     Marland Kitchen tobramycin-dexamethasone (TOBRADEX) ophthalmic solution Place 1 drop into both eyes as needed.  0   No current facility-administered medications on file prior  to visit.     Review of Systems:  As per HPI- otherwise negative. No fever or chills He is known to have bradycardia, long standing    Physical Examination: Vitals:   10/22/18 1041  BP: 122/70  Pulse: Marland Kitchen)  50  Resp: 18  Temp: 98.3 F (36.8 C)  SpO2: 97%   Vitals:   10/22/18 1041  Weight: 161 lb 3.2 oz (73.1 kg)  Height: 5' 10.5" (1.791 m)   Body mass index is 22.8 kg/m. Ideal Body Weight: Weight in (lb) to have BMI = 25: 176.4  GEN: WDWN, NAD, Non-toxic, A & O x 3, looks well with typical stigmata of parkinson's disease present  HEENT: Atraumatic, Normocephalic. Neck supple. No masses, No LAD.  Bilateral TM wnl, oropharynx normal.  PEERL,EOMI.   Ears and Nose: No external deformity. CV: RRR, No M/G/R. No JVD. No thrill. No extra heart sounds. PULM: CTA B, no wheezes, crackles, rhonchi. No retractions. No resp. distress. No accessory muscle use. ABD: S, NT, ND EXTR: No c/c/e NEURO stable gait for pt- slow. shuffling PSYCH: Normally interactive. Quiet but will answer questions and makes a few spontaneous comments as well   Pulse Readings from Last 3 Encounters:  10/22/18 (!) 50  06/26/18 (!) 58  06/08/18 (!) 44   Results for orders placed or performed in visit on 10/22/18  CBC  Result Value Ref Range   WBC 5.1 4.0 - 10.5 K/uL   RBC 4.35 4.22 - 5.81 Mil/uL   Platelets 205.0 150.0 - 400.0 K/uL   Hemoglobin 13.5 13.0 - 17.0 g/dL   HCT 40.5 39.0 - 52.0 %   MCV 93.1 78.0 - 100.0 fl   MCHC 33.3 30.0 - 36.0 g/dL   RDW 13.1 11.5 - 15.5 %  Comprehensive metabolic panel  Result Value Ref Range   Sodium 143 135 - 145 mEq/L   Potassium 4.3 3.5 - 5.1 mEq/L   Chloride 106 96 - 112 mEq/L   CO2 34 (H) 19 - 32 mEq/L   Glucose, Bld 75 70 - 99 mg/dL   BUN 17 6 - 23 mg/dL   Creatinine, Ser 0.68 0.40 - 1.50 mg/dL   Total Bilirubin 0.7 0.2 - 1.2 mg/dL   Alkaline Phosphatase 66 39 - 117 U/L   AST 21 0 - 37 U/L   ALT 14 0 - 53 U/L   Total Protein 6.2 6.0 - 8.3 g/dL   Albumin  3.7 3.5 - 5.2 g/dL   Calcium 9.1 8.4 - 10.5 mg/dL   GFR 119.74 >60.00 mL/min  Lipid panel  Result Value Ref Range   Cholesterol 133 0 - 200 mg/dL   Triglycerides 93.0 0.0 - 149.0 mg/dL   HDL 44.40 >39.00 mg/dL   VLDL 18.6 0.0 - 40.0 mg/dL   LDL Cholesterol 70 0 - 99 mg/dL   Total CHOL/HDL Ratio 3    NonHDL 89.04   Urine Microscopic Only  Result Value Ref Range   WBC, UA 0-2/hpf 0-2/hpf   RBC / HPF 0-2/hpf 0-2/hpf   Mucus, UA Presence of (A) None   Squamous Epithelial / LPF Rare(0-4/hpf) Rare(0-4/hpf)  POCT urinalysis dipstick  Result Value Ref Range   Color, UA orange (A) yellow   Clarity, UA cloudy (A) clear   Glucose, UA negative negative mg/dL   Bilirubin, UA negative negative   Ketones, POC UA negative negative mg/dL   Spec Grav, UA >=1.030 (A) 1.010 - 1.025   Blood, UA trace-intact (A) negative   pH, UA 6.0 5.0 - 8.0   Protein Ur, POC negative negative mg/dL   Urobilinogen, UA 1.0 0.2 or 1.0 E.U./dL   Nitrite, UA Negative Negative   Leukocytes, UA Negative Negative    Assessment and Plan: Physical exam  Parkinsonism, unspecified  Parkinsonism type ([place])  Obstructive sleep apnea  Medication monitoring encounter - Plan: CBC, Comprehensive metabolic panel  Screening for hyperlipidemia - Plan: Lipid panel  Abnormal urine odor - Plan: Urine Culture, POCT urinalysis dipstick, cephALEXin (KEFLEX) 500 MG capsule  Immunization due - Plan: Pneumococcal conjugate vaccine 13-valent IM  Dark urine - Plan: Urine Microscopic Only  Need for influenza vaccination - Plan: Flu vaccine HIGH DOSE PF (Fluzone High dose)  Routine visit/ CPE today Flu and prevnar given He may be due to tdap but not covered- reminded to get a booster if any wound Urine today does suggest UTI- start on keflex while we await culture Received his labs- message to pt    Signed Lamar Blinks, MD  Received his urine culture 11/2 Results for orders placed or performed in visit on 10/22/18   Urine Culture  Result Value Ref Range   MICRO NUMBER: [id number]    SPECIMEN QUALITY: ADEQUATE    Sample Source NOT GIVEN    STATUS: FINAL    Result:      Single organism less than 10,000 CFU/mL isolated. These organisms, commonly found on external and internal genitalia, are considered colonizers. No further testing performed.  CBC  Result Value Ref Range   WBC 5.1 4.0 - 10.5 K/uL   RBC 4.35 4.22 - 5.81 Mil/uL   Platelets 205.0 150.0 - 400.0 K/uL   Hemoglobin 13.5 13.0 - 17.0 g/dL   HCT 40.5 39.0 - 52.0 %   MCV 93.1 78.0 - 100.0 fl   MCHC 33.3 30.0 - 36.0 g/dL   RDW 13.1 11.5 - 15.5 %  Comprehensive metabolic panel  Result Value Ref Range   Sodium 143 135 - 145 mEq/L   Potassium 4.3 3.5 - 5.1 mEq/L   Chloride 106 96 - 112 mEq/L   CO2 34 (H) 19 - 32 mEq/L   Glucose, Bld 75 70 - 99 mg/dL   BUN 17 6 - 23 mg/dL   Creatinine, Ser 0.68 0.40 - 1.50 mg/dL   Total Bilirubin 0.7 0.2 - 1.2 mg/dL   Alkaline Phosphatase 66 39 - 117 U/L   AST 21 0 - 37 U/L   ALT 14 0 - 53 U/L   Total Protein 6.2 6.0 - 8.3 g/dL   Albumin 3.7 3.5 - 5.2 g/dL   Calcium 9.1 8.4 - 10.5 mg/dL   GFR 119.74 >60.00 mL/min  Lipid panel  Result Value Ref Range   Cholesterol 133 0 - 200 mg/dL   Triglycerides 93.0 0.0 - 149.0 mg/dL   HDL 44.40 >39.00 mg/dL   VLDL 18.6 0.0 - 40.0 mg/dL   LDL Cholesterol 70 0 - 99 mg/dL   Total CHOL/HDL Ratio 3    NonHDL 89.04   Urine Microscopic Only  Result Value Ref Range   WBC, UA 0-2/hpf 0-2/hpf   RBC / HPF 0-2/hpf 0-2/hpf   Mucus, UA Presence of (A) None   Squamous Epithelial / LPF Rare(0-4/hpf) Rare(0-4/hpf)  POCT urinalysis dipstick  Result Value Ref Range   Color, UA orange (A) yellow   Clarity, UA cloudy (A) clear   Glucose, UA negative negative mg/dL   Bilirubin, UA negative negative   Ketones, POC UA negative negative mg/dL   Spec Grav, UA >=1.030 (A) 1.010 - 1.025   Blood, UA trace-intact (A) negative   pH, UA 6.0 5.0 - 8.0   Protein Ur, POC negative  negative mg/dL   Urobilinogen, UA 1.0 0.2 or 1.0 E.U./dL   Nitrite, UA Negative Negative  Leukocytes, UA Negative Negative   Message to pt on mychart regarding urine culture and also called and LMOM on phone

## 2018-10-22 ENCOUNTER — Ambulatory Visit (INDEPENDENT_AMBULATORY_CARE_PROVIDER_SITE_OTHER): Payer: PPO | Admitting: Family Medicine

## 2018-10-22 ENCOUNTER — Encounter: Payer: Self-pay | Admitting: Family Medicine

## 2018-10-22 VITALS — BP 122/70 | HR 50 | Temp 98.3°F | Resp 18 | Ht 70.5 in | Wt 161.2 lb

## 2018-10-22 DIAGNOSIS — Z13220 Encounter for screening for lipoid disorders: Secondary | ICD-10-CM

## 2018-10-22 DIAGNOSIS — G4733 Obstructive sleep apnea (adult) (pediatric): Secondary | ICD-10-CM

## 2018-10-22 DIAGNOSIS — Z0000 Encounter for general adult medical examination without abnormal findings: Secondary | ICD-10-CM | POA: Diagnosis not present

## 2018-10-22 DIAGNOSIS — Z23 Encounter for immunization: Secondary | ICD-10-CM | POA: Diagnosis not present

## 2018-10-22 DIAGNOSIS — R82998 Other abnormal findings in urine: Secondary | ICD-10-CM

## 2018-10-22 DIAGNOSIS — Z5181 Encounter for therapeutic drug level monitoring: Secondary | ICD-10-CM

## 2018-10-22 DIAGNOSIS — G20 Parkinson's disease: Secondary | ICD-10-CM

## 2018-10-22 DIAGNOSIS — R8290 Unspecified abnormal findings in urine: Secondary | ICD-10-CM | POA: Diagnosis not present

## 2018-10-22 LAB — COMPREHENSIVE METABOLIC PANEL
ALK PHOS: 66 U/L (ref 39–117)
ALT: 14 U/L (ref 0–53)
AST: 21 U/L (ref 0–37)
Albumin: 3.7 g/dL (ref 3.5–5.2)
BUN: 17 mg/dL (ref 6–23)
CHLORIDE: 106 meq/L (ref 96–112)
CO2: 34 meq/L — AB (ref 19–32)
Calcium: 9.1 mg/dL (ref 8.4–10.5)
Creatinine, Ser: 0.68 mg/dL (ref 0.40–1.50)
GFR: 119.74 mL/min (ref >60.00)
GLUCOSE: 75 mg/dL (ref 70–99)
Potassium: 4.3 mEq/L (ref 3.5–5.1)
SODIUM: 143 meq/L (ref 135–145)
Total Bilirubin: 0.7 mg/dL (ref 0.2–1.2)
Total Protein: 6.2 g/dL (ref 6.0–8.3)

## 2018-10-22 LAB — CBC
HEMATOCRIT: 40.5 % (ref 39.0–52.0)
HEMOGLOBIN: 13.5 g/dL (ref 13.0–17.0)
MCHC: 33.3 g/dL (ref 30.0–36.0)
MCV: 93.1 fl (ref 78.0–100.0)
Platelets: 205 10*3/uL (ref 150.0–400.0)
RBC: 4.35 Mil/uL (ref 4.22–5.81)
RDW: 13.1 % (ref 11.5–15.5)
WBC: 5.1 10*3/uL (ref 4.0–10.5)

## 2018-10-22 LAB — LIPID PANEL
CHOL/HDL RATIO: 3
Cholesterol: 133 mg/dL (ref 0–200)
HDL: 44.4 mg/dL (ref >39.00)
LDL CALC: 70 mg/dL (ref 0–99)
NONHDL: 89.04
Triglycerides: 93 mg/dL (ref 0.0–149.0)
VLDL: 18.6 mg/dL (ref 0.0–40.0)

## 2018-10-22 LAB — POCT URINALYSIS DIP (MANUAL ENTRY)
BILIRUBIN UA: NEGATIVE
BILIRUBIN UA: NEGATIVE mg/dL
Glucose, UA: NEGATIVE mg/dL
LEUKOCYTES UA: NEGATIVE
Nitrite, UA: NEGATIVE
PH UA: 6 (ref 5.0–8.0)
Protein Ur, POC: NEGATIVE mg/dL
Spec Grav, UA: >=1.03 — AB (ref 1.010–1.025)
Urobilinogen, UA: 1 E.U./dL

## 2018-10-22 LAB — URINALYSIS, MICROSCOPIC ONLY

## 2018-10-22 MED ORDER — CEPHALEXIN 500 MG PO CAPS: 500.0000 mg | ORAL_CAPSULE | Freq: Two times a day (BID) | ORAL | 0 refills | Status: DC

## 2018-10-22 NOTE — Patient Instructions (Addendum)
It was good to see you today!  You got your flu shot and pneumonia vaccine today We are going to culture your urine and also will go ahead and treat you for a possible urine infection- keflex twice a day for one week  Please use your walker to help prevent falls I will be in touch with your labs asap   Please let me know if any concerns come up. Otherwise we can visit in 4-6 months   Health Maintenance, Male A healthy lifestyle and preventive care is important for your health and wellness. Ask your health care provider about what schedule of regular examinations is right for you. What should I know about weight and diet? Eat a Healthy Diet  Eat plenty of vegetables, fruits, whole grains, low-fat dairy products, and lean protein.  Do not eat a lot of foods high in solid fats, added sugars, or salt.  Maintain a Healthy Weight Regular exercise can help you achieve or maintain a healthy weight. You should:  Do at least 150 minutes of exercise each week. The exercise should increase your heart rate and make you sweat (moderate-intensity exercise).  Do strength-training exercises at least twice a week.  Watch Your Levels of Cholesterol and Blood Lipids  Have your blood tested for lipids and cholesterol every 5 years starting at 78 years of age. If you are at high risk for heart disease, you should start having your blood tested when you are 78 years old. You may need to have your cholesterol levels checked more often if: ? Your lipid or cholesterol levels are high. ? You are older than 78 years of age. ? You are at high risk for heart disease.  What should I know about cancer screening? Many types of cancers can be detected early and may often be prevented. Lung Cancer  You should be screened every year for lung cancer if: ? You are a current smoker who has smoked for at least 30 years. ? You are a former smoker who has quit within the past 15 years.  Talk to your health care  provider about your screening options, when you should start screening, and how often you should be screened.  Colorectal Cancer  Routine colorectal cancer screening usually begins at 78 years of age and should be repeated every 5-10 years until you are 78 years old. You may need to be screened more often if early forms of precancerous polyps or small growths are found. Your health care provider may recommend screening at an earlier age if you have risk factors for colon cancer.  Your health care provider may recommend using home test kits to check for hidden blood in the stool.  A small camera at the end of a tube can be used to examine your colon (sigmoidoscopy or colonoscopy). This checks for the earliest forms of colorectal cancer.  Prostate and Testicular Cancer  Depending on your age and overall health, your health care provider may do certain tests to screen for prostate and testicular cancer.  Talk to your health care provider about any symptoms or concerns you have about testicular or prostate cancer.  Skin Cancer  Check your skin from head to toe regularly.  Tell your health care provider about any new moles or changes in moles, especially if: ? There is a change in a mole's size, shape, or color. ? You have a mole that is larger than a pencil eraser.  Always use sunscreen. Apply sunscreen liberally and repeat  throughout the day.  Protect yourself by wearing long sleeves, pants, a wide-brimmed hat, and sunglasses when outside.  What should I know about heart disease, diabetes, and high blood pressure?  If you are 2-18 years of age, have your blood pressure checked every 3-5 years. If you are 48 years of age or older, have your blood pressure checked every year. You should have your blood pressure measured twice-once when you are at a hospital or clinic, and once when you are not at a hospital or clinic. Record the average of the two measurements. To check your blood pressure  when you are not at a hospital or clinic, you can use: ? An automated blood pressure machine at a pharmacy. ? A home blood pressure monitor.  Talk to your health care provider about your target blood pressure.  If you are between 28-68 years old, ask your health care provider if you should take aspirin to prevent heart disease.  Have regular diabetes screenings by checking your fasting blood sugar level. ? If you are at a normal weight and have a low risk for diabetes, have this test once every three years after the age of 73. ? If you are overweight and have a high risk for diabetes, consider being tested at a younger age or more often.  A one-time screening for abdominal aortic aneurysm (AAA) by ultrasound is recommended for men aged 18-75 years who are current or former smokers. What should I know about preventing infection? Hepatitis B If you have a higher risk for hepatitis B, you should be screened for this virus. Talk with your health care provider to find out if you are at risk for hepatitis B infection. Hepatitis C Blood testing is recommended for:  Everyone born from [birth year] through 1966.  Anyone with known risk factors for hepatitis C.  Sexually Transmitted Diseases (STDs)  You should be screened each year for STDs including gonorrhea and chlamydia if: ? You are sexually active and are younger than 78 years of age. ? You are older than 78 years of age and your health care provider tells you that you are at risk for this type of infection. ? Your sexual activity has changed since you were last screened and you are at an increased risk for chlamydia or gonorrhea. Ask your health care provider if you are at risk.  Talk with your health care provider about whether you are at high risk of being infected with HIV. Your health care provider may recommend a prescription medicine to help prevent HIV infection.  What else can I do?  Schedule regular health, dental, and eye  exams.  Stay current with your vaccines (immunizations).  Do not use any tobacco products, such as cigarettes, chewing tobacco, and e-cigarettes. If you need help quitting, ask your health care provider.  Limit alcohol intake to no more than 2 drinks per day. One drink equals 12 ounces of beer, 5 ounces of wine, or 1 ounces of hard liquor.  Do not use street drugs.  Do not share needles.  Ask your health care provider for help if you need support or information about quitting drugs.  Tell your health care provider if you often feel depressed.  Tell your health care provider if you have ever been abused or do not feel safe at home. This information is not intended to replace advice given to you by your health care provider. Make sure you discuss any questions you have with your health care provider.  Document Released: 06/06/2008 Document Revised: 08/07/2016 Document Reviewed: 09/12/2015 Elsevier Interactive Patient Education  [employer].

## 2018-10-23 LAB — URINE CULTURE
MICRO NUMBER: 91311747
SPECIMEN QUALITY:: ADEQUATE

## 2018-10-24 ENCOUNTER — Encounter: Payer: Self-pay | Admitting: Family Medicine

## 2018-10-28 ENCOUNTER — Encounter: Payer: Self-pay | Admitting: Family Medicine

## 2018-10-28 DIAGNOSIS — N3941 Urge incontinence: Secondary | ICD-10-CM | POA: Insufficient documentation

## 2018-11-04 ENCOUNTER — Other Ambulatory Visit: Payer: Self-pay | Admitting: Neurology

## 2018-11-16 NOTE — Progress Notes (Signed)
Christopher Atkinson was seen today in the movement disorders clinic for neurologic consultation at the request of Copland, Gay Filler, MD.  The consultation is for the 2nd opinion evaluation of PD.  The records that were made available to me were reviewed.  Pt has been seeing Dr. Jaynee Eagles and Ward Givens.  Patient has been seeing Dr. Jaynee Eagles since 2015.  On her first visit in epic with [place] neurolog[facility], he was being seen for complicated headache, that would manifest at times as expressive aphasia and strokelike syndromes.  He had memory change at that point in time.  In fact, Depakote was being administered for agitation and inappropriate behavior.  Wife states that the issue was when he couldn't speak with headache, he would be agitated and she thought that VPA was for headache.    His Moca was 19 in 2015.  By August, 2016 he was on Namenda.  He was referred back to [facility] neurology in March, 2017 for tremor.  Records indicate postural tremor was noted.  Depakote dose was decreased because of tremor.  Propranolol was attempted but discontinued because of bradycardia.  Notes from [facility] neurology from August, 2018 indicate that the patient was shuffling and freezing more.  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  Levodopa was attempted in August, 2018.  Wife reports he was dizzy on that and he only took it for 2 weeks.    06/26/18 update: Patient is seen today in follow-up for Parkinson's disease.  He is accompanied by his wife who supplements the history.  He was started on carbidopa/levodopa 25/100 CR last visit and work to 1 tablet 3 times per day (previously had dizziness with IR version).  Today, he reports that he isn't sure if it helps but wife thinks that it does.  After examining him, the patient does state that he is much better than he was.  He isn't doing CV exercise but lifts light weights at home.  He is dreaming a lot and the patient's wife is no longer sleeping with him, but  then the patient states that dreaming has been somewhat better lately.  He has not fallen out of the bed..  Records have been reviewed since last visit.  He has faithfully attended rehab therapies.  11/17/18 update:  Pt seen in f/u for PD.  This patient is accompanied in the office by his spouse who supplements the history.  He is on carbidopa/levodopa 25/100 CR, 1 tablet 3 times per day.  Pt had a fall a few days ago - he tripped over the threshold going into the house.  No one was with him and he was able to get up by himself.  Noting some leg cramping at night especially when he gets up at night to go to the bathroom.   Pt denies lightheadedness, near syncope.  No hallucinations.  Mood has been good.  The records that were made available to me were reviewed.  Saw Copland, Gay Filler, MD on 10/22/18.  PREVIOUS MEDICATIONS: Sinemet  ALLERGIES:   Allergies  Allergen Reactions  . Simvastatin     Other reaction(s): FATIGUE    CURRENT MEDICATIONS:  Outpatient Encounter Medications as of 11/17/2018  Medication Sig  . butalbital-acetaminophen-caffeine (FIORICET, ESGIC) 50-325-40 MG tablet TAKE 1 TABLET BY MOUTH EVERY 6 HOURS AS NEEDED FOR HEADACHE( DO NOT TAKE MORE THAN 10 TABLETS PER MONTH)  . Carbidopa-Levodopa ER (SINEMET CR) 25-100 MG tablet controlled release TAKE 1 TABLET BY MOUTH  THREE TIMES A DAY  . divalproex (DEPAKOTE ER) 500 MG 24 hr tablet Take 1 tablet (500 mg total) by mouth daily.  Marland Kitchen donepezil (ARICEPT) 10 MG tablet Take 1 tablet (10 mg total) by mouth at bedtime.  Marland Kitchen doxycycline (VIBRAMYCIN) 100 MG capsule Take 100 mg by mouth 2 (two) times daily.  . memantine (NAMENDA XR) 28 MG CP24 24 hr capsule TAKE 1 CAPSULE BY MOUTH EVERY DAY  . mirabegron ER (MYRBETRIQ) 25 MG TB24 tablet Take 25 mg by mouth daily.  . Omega-3 Fatty Acids (FISH OIL) 1000 MG CAPS Take 1 tablet by mouth 3 (three) times daily.   Marland Kitchen tobramycin-dexamethasone (TOBRADEX) ophthalmic solution Place 1 drop into both eyes as  needed.  . [DISCONTINUED] cephALEXin (KEFLEX) 500 MG capsule Take 1 capsule (500 mg total) by mouth 2 (two) times daily.   No facility-administered encounter medications on file as of 11/17/2018.     PAST MEDICAL HISTORY:   Past Medical History:  Diagnosis Date  . Aortic insufficiency   . Headache   . Obstructive sleep apnea    on CPAP  . Thoracic aortic aneurysm ([place])     PAST SURGICAL HISTORY:   Past Surgical History:  Procedure Laterality Date  . BACK SURGERY    . CATARACT EXTRACTION, BILATERAL    . CHOLECYSTECTOMY    . HERNIA REPAIR    . HERNIA REPAIR    . HERNIA REPAIR    . NOSE SURGERY      SOCIAL HISTORY:   Social History   Socioeconomic History  . Marital status: Married    Spouse name: [name]   . Number of children: 2  . Years of education: 55  . Highest education level: Not on file  Occupational History  . Occupation: retired    Comment: Firefighter  Social Needs  . Financial resource strain: Not on file  . Food insecurity:    Worry: Not on file    Inability: Not on file  . Transportation needs:    Medical: Not on file    Non-medical: Not on file  Tobacco Use  . Smoking status: Former Research scientist (life sciences)  . Smokeless tobacco: Never Used  . Tobacco comment: quit smoking when he was 77 years old  Substance and Sexual Activity  . Alcohol use: No    Alcohol/week: 0.0 standard drinks  . Drug use: No  . Sexual activity: Not on file  Lifestyle  . Physical activity:    Days per week: Not on file    Minutes per session: Not on file  . Stress: Not on file  Relationships  . Social connections:    Talks on phone: Not on file    Gets together: Not on file    Attends religious service: Not on file    Active member of club or organization: Not on file    Attends meetings of clubs or organizations: Not on file    Relationship status: Not on file  . Intimate partner violence:    Fear of current or ex partner: Not on file    Emotionally abused: Not on file     Physically abused: Not on file    Forced sexual activity: Not on file  Other Topics Concern  . Not on file  Social History Narrative   Patient lives at home with wife [name]    Patient as 2 children.    Patient is right handed.    Patient has a high school education.  FAMILY HISTORY:   Family Status  Relation Name Status  . Father  Deceased  . Mother  Deceased  . Brother  Deceased  . Brother  Deceased  . Brother  Deceased  . Son  Alive  . Daughter  Alive  . Brother  Alive  . Brother  Alive    ROS:  Review of Systems  Constitutional: Positive for malaise/fatigue.  HENT: Negative.   Eyes: Negative.   Respiratory: Negative.   Cardiovascular: Negative.   Gastrointestinal: Negative.   Genitourinary: Negative.   Musculoskeletal: Negative.   Skin: Negative.      PHYSICAL EXAMINATION:    VITALS:   Vitals:   11/17/18 1102  BP: 130/80  Pulse: (!) 50  SpO2: 95%  Weight: 157 lb (71.2 kg)  Height: 5\' 11"  (1.803 m)    GEN:  The patient appears stated age and is in NAD. HEENT:  Normocephalic, atraumatic.  The mucous membranes are moist. The superficial temporal arteries are without ropiness or tenderness.  He drools slightly.   Cardiovascular: Bradycardic.  Regular. Lungs: Clear to auscultation bilaterally. Neck/HEME:  There are no carotid bruits bilaterally.  Neurological examination:  Orientation: He is alert and oriented x3. Montreal Cognitive Assessment  11/17/2018 04/08/2018 02/21/2016 11/16/2014 11/02/2014  Visuospatial/ Executive (0/5) 3 4 3 4 4   Naming (0/3) 2 2 2 3 3   Attention: Read list of digits (0/2) 1 2 2 2 2   Attention: Read list of letters (0/1) 1 1 1 1 1   Attention: Serial 7 subtraction starting at 100 (0/3) 2 3 3 3 2   Language: Repeat phrase (0/2) 1 2 2 2 2   Language : Fluency (0/1) 0 1 0 0 0  Abstraction (0/2) 1 1 1 2 1   Delayed Recall (0/5) 0 1 0 0 0  Orientation (0/6) 4 6 6 4 4   Total 15 23 20 21 19   Adjusted Score (based on education)  15 23 21 22 20      Cranial nerves: There is good facial symmetry.  There is facial hypomimia.  The speech is fluent and clear.  He is hypophonic.  Soft palate rises symmetrically and there is no tongue deviation. Hearing is intact to conversational tone. Sensation: Sensation is intact to light touch throughout Motor: Strength is 5/5 in the bilateral upper and lower extremities.   Shoulder shrug is equal and symmetric.  There is no pronator drift.  Movement examination: Tone: There is normal tone in the left upper extremity today.  There is mild increased tone in the right upper extremity today.  This is markedly improved compared to previous. Abnormal movements: There is no tremor noted today. Coordination:  There is mild decremation with RAM's, with any form of RAMS, including alternating supination and pronation of the forearm, hand opening and closing, finger taps, heel taps and toe taps bilaterally Gait and Station: The patient has mild difficulty arising out of a deep-seated chair without the use of the hands.  He is able to arise without the use of his hands on the third attempt (same as previous visit).  He has a stooped posture, but purposefully tries to stand tall as he learned in therapy and tries to walk with big steps.    ASSESSMENT/PLAN:  1.  Idiopathic Parkinson's disease  -Patient looks markedly better on carbidopa/levodopa 25/100 CR, 1 tablet 3 times per day.  Will add a bedtime dose due to nighttime cramping  -discussed importance of safe, CV exercise  -discussed safety in the home  -discussed  importance of routine  2.  Sialorrhea  -This is commonly associated with PD.  We talked about treatments.  The patient is not a candidate for oral anticholinergic therapy because of increased risk of confusion and falls.  We discussed Botox (type A and B) and 1% atropine drops.  We discusssed that candy like lemon drops can help by stimulating mm of the oropharynx to induce swallowing.   He will think about his options.  3.  REM behavior disorder  -This is commonly associated with PD and the patient is experiencing this.  We discussed that this can be very serious and even harmful.  We talked about medications as well as physical barriers to put in the bed (particularly soft bed rails, pillow barriers).  We talked about moving the night stand so that it is not so close to the side of the bed.  Ultimately, he decided that this was not so bad and really did not want any medications.  He will make sure that his sleeping quarters are safe.  He was given patient education on this.  4. Parkinsons dementia  -d/c aricept d/t bradycardia  -continue memantine (started by prior neurologist - I have never RX)  5.  Pt/wife are moving to [place].  Will refer [facility] movement at [facility].  Much greater than 50% of this visit was spent in counseling and coordinating care.  Total face to face time:  25 min   Cc:  Copland, Gay Filler, MD

## 2018-11-17 ENCOUNTER — Telehealth: Payer: Self-pay | Admitting: Neurology

## 2018-11-17 ENCOUNTER — Encounter: Payer: Self-pay | Admitting: Neurology

## 2018-11-17 ENCOUNTER — Ambulatory Visit (INDEPENDENT_AMBULATORY_CARE_PROVIDER_SITE_OTHER): Payer: PPO | Admitting: Neurology

## 2018-11-17 VITALS — BP 130/80 | HR 50 | Ht 71.0 in | Wt 157.0 lb

## 2018-11-17 DIAGNOSIS — G4752 REM sleep behavior disorder: Secondary | ICD-10-CM | POA: Diagnosis not present

## 2018-11-17 DIAGNOSIS — G20 Parkinson's disease: Secondary | ICD-10-CM

## 2018-11-17 DIAGNOSIS — F0280 Dementia in other diseases classified elsewhere without behavioral disturbance: Secondary | ICD-10-CM

## 2018-11-17 NOTE — Patient Instructions (Addendum)
Take carbidopa/levodopa 25/100 CR, 1 tablet at 8am/noon/4pm and add one tablet at bedtime  Stop donepezil (this is also called aricept).  I stopped this because your heart rate is low.  Continue namenda (this is also called memantine)

## 2018-11-17 NOTE — Telephone Encounter (Signed)
Referral faxed to [facility] - Dr. Cordella Register at [phone] with confirmation received. They will contact patient directly to schedule an appt.

## 2018-11-24 ENCOUNTER — Telehealth: Payer: Self-pay | Admitting: Neurology

## 2018-11-24 MED ORDER — CARBIDOPA-LEVODOPA ER 25-100 MG PO TBCR: 1.0000 | EXTENDED_RELEASE_TABLET | Freq: Four times a day (QID) | ORAL | 1 refills | Status: AC

## 2018-11-24 NOTE — Telephone Encounter (Signed)
Patient's wife called regarding needing a refill on Faust's Carbidopa Levodopa. Thanks

## 2018-11-24 NOTE — Telephone Encounter (Signed)
RX sent to pharmacy with increased dose of Levodopa.

## 2018-12-09 ENCOUNTER — Other Ambulatory Visit: Payer: Self-pay | Admitting: Neurology

## 2019-01-13 ENCOUNTER — Ambulatory Visit: Payer: Self-pay | Admitting: Physical Therapy

## 2019-01-13 ENCOUNTER — Encounter: Payer: Self-pay | Admitting: Occupational Therapy

## 2019-01-16 ENCOUNTER — Other Ambulatory Visit: Payer: Self-pay | Admitting: Neurology

## 2019-02-10 ENCOUNTER — Other Ambulatory Visit: Payer: Self-pay | Admitting: Neurology

## 2019-04-15 ENCOUNTER — Telehealth: Payer: Self-pay | Admitting: *Deleted

## 2019-04-15 NOTE — Telephone Encounter (Signed)
Left message for patient to call and schedule yearly visit with Dr. Gwenlyn Found  Virtual/telephone

## 2019-05-06 NOTE — Telephone Encounter (Signed)
A message was left, re: follow up visit. 

## 2019-05-06 NOTE — Telephone Encounter (Signed)
New Message   Patient doesn't live in [place] anymore and would like a nurse to call back.

## 2019-05-26 NOTE — Telephone Encounter (Signed)
Contacted pt to f/u on request to speak with a nurse. [email or web address]

## 2019-09-08 ENCOUNTER — Telehealth: Payer: Self-pay | Admitting: Cardiovascular Disease

## 2019-09-08 NOTE — Telephone Encounter (Signed)
LVM for patient to call and schedule overdue yearly followup with Dr. Gwenlyn Found.

## 2020-01-25 ENCOUNTER — Telehealth: Payer: Self-pay | Admitting: *Deleted

## 2020-01-25 NOTE — Telephone Encounter (Signed)
Unable to leave a message,mailbox is full. 

## 2020-11-22 DEATH — deceased
